# Patient Record
Sex: Female | Born: 1965 | Race: White | Hispanic: No | Marital: Married | Smoking: Never smoker
Health system: Southern US, Community
[De-identification: ages and names within clinical notes are randomized; demographics above are authoritative.]

## PROBLEM LIST (undated history)

## (undated) DIAGNOSIS — I341 Nonrheumatic mitral (valve) prolapse: Secondary | ICD-10-CM

## (undated) DIAGNOSIS — Z973 Presence of spectacles and contact lenses: Secondary | ICD-10-CM

## (undated) DIAGNOSIS — F419 Anxiety disorder, unspecified: Secondary | ICD-10-CM

## (undated) DIAGNOSIS — I10 Essential (primary) hypertension: Secondary | ICD-10-CM

## (undated) DIAGNOSIS — M533 Sacrococcygeal disorders, not elsewhere classified: Secondary | ICD-10-CM

## (undated) DIAGNOSIS — R112 Nausea with vomiting, unspecified: Secondary | ICD-10-CM

## (undated) DIAGNOSIS — K219 Gastro-esophageal reflux disease without esophagitis: Secondary | ICD-10-CM

## (undated) DIAGNOSIS — M1990 Unspecified osteoarthritis, unspecified site: Secondary | ICD-10-CM

## (undated) DIAGNOSIS — Z9889 Other specified postprocedural states: Secondary | ICD-10-CM

## (undated) DIAGNOSIS — C801 Malignant (primary) neoplasm, unspecified: Secondary | ICD-10-CM

## (undated) DIAGNOSIS — E039 Hypothyroidism, unspecified: Secondary | ICD-10-CM

## (undated) DIAGNOSIS — G971 Other reaction to spinal and lumbar puncture: Secondary | ICD-10-CM

## (undated) HISTORY — PX: COLONOSCOPY W/ BIOPSIES AND POLYPECTOMY: SHX1376

## (undated) HISTORY — PX: BACK SURGERY: SHX140

## (undated) HISTORY — PX: TUBAL LIGATION: SHX77

## (undated) HISTORY — PX: ENDOMETRIAL ABLATION: SHX621

---

## 2013-09-12 DIAGNOSIS — R350 Frequency of micturition: Secondary | ICD-10-CM | POA: Insufficient documentation

## 2013-09-12 DIAGNOSIS — F419 Anxiety disorder, unspecified: Secondary | ICD-10-CM | POA: Insufficient documentation

## 2013-09-12 DIAGNOSIS — E039 Hypothyroidism, unspecified: Secondary | ICD-10-CM | POA: Insufficient documentation

## 2013-10-21 DIAGNOSIS — C4491 Basal cell carcinoma of skin, unspecified: Secondary | ICD-10-CM | POA: Insufficient documentation

## 2013-10-21 DIAGNOSIS — Z85828 Personal history of other malignant neoplasm of skin: Secondary | ICD-10-CM | POA: Insufficient documentation

## 2013-11-16 ENCOUNTER — Ambulatory Visit: Payer: Self-pay | Admitting: Family Medicine

## 2013-11-16 DIAGNOSIS — I341 Nonrheumatic mitral (valve) prolapse: Secondary | ICD-10-CM | POA: Insufficient documentation

## 2013-12-18 ENCOUNTER — Ambulatory Visit: Payer: Self-pay | Admitting: Physician Assistant

## 2013-12-18 LAB — URINALYSIS, COMPLETE
BILIRUBIN, UR: NEGATIVE
Blood: NEGATIVE
Glucose,UR: NEGATIVE mg/dL (ref 0–75)
Ketone: NEGATIVE
Nitrite: NEGATIVE
PH: 6.5 (ref 4.5–8.0)
Protein: 30
RBC,UR: NONE SEEN /HPF (ref 0–5)
Specific Gravity: 1.01 (ref 1.003–1.030)
WBC UR: >30 /HPF (ref 0–5)

## 2013-12-20 LAB — URINE CULTURE

## 2014-11-20 DIAGNOSIS — Z85820 Personal history of malignant melanoma of skin: Secondary | ICD-10-CM | POA: Insufficient documentation

## 2014-12-06 DIAGNOSIS — R002 Palpitations: Secondary | ICD-10-CM | POA: Insufficient documentation

## 2014-12-06 DIAGNOSIS — E782 Mixed hyperlipidemia: Secondary | ICD-10-CM | POA: Insufficient documentation

## 2014-12-16 ENCOUNTER — Emergency Department: Payer: Self-pay | Admitting: Emergency Medicine

## 2015-03-02 ENCOUNTER — Ambulatory Visit: Payer: Self-pay | Admitting: Family Medicine

## 2015-09-18 DIAGNOSIS — I10 Essential (primary) hypertension: Secondary | ICD-10-CM | POA: Insufficient documentation

## 2016-03-31 ENCOUNTER — Ambulatory Visit: Payer: Self-pay | Admitting: Physician Assistant

## 2016-05-19 NOTE — Pre-Procedure Instructions (Signed)
Donna Cannon  05/19/2016      Your procedure is scheduled on June 14.  Report to [facility] Admitting at 6:30 A.M.  Call this number if you have problems the morning of surgery:  [phone]   Remember:  Do not eat food or drink liquids after midnight.  Take these medicines the morning of surgery with A SIP OF WATER Levothyroxine, Lorazepam (if needed, Metoprolol   STOP/ Do not take Aspirin, Aleve, Naproxen, Advil, Ibuprofen, Motrin, Vitamins, Herbs, or Supplements starting Wednesday June 7   Do not wear jewelry, make-up or nail polish.  Do not wear lotions, powders, or perfumes.  You may wear deodorant.  Do not shave 48 hours prior to surgery.  Men may shave face and neck.  Do not bring valuables to the hospital.  [facility] is not responsible for any belongings or valuables.  Contacts, dentures or bridgework may not be worn into surgery.  Leave your suitcase in the car.  After surgery it may be brought to your room.  For patients admitted to the hospital, discharge time will be determined by your treatment team.  Patients discharged the day of surgery will not be allowed to drive home.   [facility] - Preparing for Surgery  Before surgery, you can play an important role.  Because skin is not sterile, your skin needs to be as free of germs as possible.  You can reduce the number of germs on you skin by washing with CHG (chlorahexidine gluconate) soap before surgery.  CHG is an antiseptic cleaner which kills germs and bonds with the skin to continue killing germs even after washing.  Please DO NOT use if you have an allergy to CHG or antibacterial soaps.  If your skin becomes reddened/irritated stop using the CHG and inform your nurse when you arrive at Short Stay.  Do not shave (including legs and underarms) for at least 48 hours prior to the first CHG shower.  You may shave your face.  Please follow these instructions carefully:   1.  Shower with CHG Soap the  night before surgery and the morning of Surgery.  2.  If you choose to wash your hair, wash your hair first as usual with your normal shampoo.  3.  After you shampoo, rinse your hair and body thoroughly to remove the shampoo.  4.  Use CHG as you would any other liquid soap.  You can apply CHG directly to the skin and wash gently with scrungie or a clean washcloth.  5.  Apply the CHG Soap to your body ONLY FROM THE NECK DOWN.  Do not use on open wounds or open sores.  Avoid contact with your eyes, ears, mouth and genitals (private parts).  Wash genitals (private parts) with your normal soap.  6.  Wash thoroughly, paying special attention to the area where your surgery will be performed.  7.  Thoroughly rinse your body with warm water from the neck down.  8.  DO NOT shower/wash with your normal soap after using and rinsing off the CHG Soap.  9.  Pat yourself dry with a clean towel.            10.  Wear clean pajamas.            11.  Place clean sheets on your bed the night of your first shower and do not sleep with pets.  Day of Surgery  Do not apply any lotions the morning  of surgery.  Please wear clean clothes to the hospital/surgery center.

## 2016-05-20 ENCOUNTER — Encounter (HOSPITAL_COMMUNITY)
Admission: RE | Admit: 2016-05-20 | Discharge: 2016-05-20 | Disposition: A | Discharge status: Home / Self Care | Payer: BLUE CROSS/BLUE SHIELD | Source: Ambulatory Visit | Attending: Orthopedic Surgery | Admitting: Orthopedic Surgery

## 2016-05-20 ENCOUNTER — Encounter (HOSPITAL_COMMUNITY): Payer: Self-pay

## 2016-05-20 DIAGNOSIS — Z0183 Encounter for blood typing: Secondary | ICD-10-CM | POA: Diagnosis not present

## 2016-05-20 DIAGNOSIS — M533 Sacrococcygeal disorders, not elsewhere classified: Secondary | ICD-10-CM | POA: Insufficient documentation

## 2016-05-20 DIAGNOSIS — Z01812 Encounter for preprocedural laboratory examination: Secondary | ICD-10-CM | POA: Diagnosis present

## 2016-05-20 HISTORY — DX: Nonrheumatic mitral (valve) prolapse: I34.1

## 2016-05-20 HISTORY — DX: Hypothyroidism, unspecified: E03.9

## 2016-05-20 HISTORY — DX: Essential (primary) hypertension: I10

## 2016-05-20 HISTORY — DX: Other specified postprocedural states: Z98.890

## 2016-05-20 HISTORY — DX: Malignant (primary) neoplasm, unspecified: C80.1

## 2016-05-20 HISTORY — DX: Anxiety disorder, unspecified: F41.9

## 2016-05-20 HISTORY — DX: Other specified postprocedural states: R11.2

## 2016-05-20 HISTORY — DX: Other reaction to spinal and lumbar puncture: G97.1

## 2016-05-20 HISTORY — DX: Presence of spectacles and contact lenses: Z97.3

## 2016-05-20 HISTORY — DX: Gastro-esophageal reflux disease without esophagitis: K21.9

## 2016-05-20 HISTORY — DX: Unspecified osteoarthritis, unspecified site: M19.90

## 2016-05-20 HISTORY — DX: Sacrococcygeal disorders, not elsewhere classified: M53.3

## 2016-05-20 LAB — BASIC METABOLIC PANEL
Anion gap: 9 (ref 5–15)
BUN: 11 mg/dL (ref 6–20)
CO2: 19 mmol/L — ABNORMAL LOW (ref 22–32)
Calcium: 9.2 mg/dL (ref 8.9–10.3)
Chloride: 109 mmol/L (ref 101–111)
Creatinine, Ser: 1.12 mg/dL — ABNORMAL HIGH (ref 0.44–1.00)
GFR calc Af Amer: >60 mL/min (ref >60)
GFR, EST NON AFRICAN AMERICAN: 56 mL/min — AB (ref >60)
Glucose, Bld: 92 mg/dL (ref 65–99)
POTASSIUM: 3.9 mmol/L (ref 3.5–5.1)
SODIUM: 137 mmol/L (ref 135–145)

## 2016-05-20 LAB — TYPE AND SCREEN
ABO/RH(D): A POS
Antibody Screen: NEGATIVE

## 2016-05-20 LAB — CBC
HEMATOCRIT: 40 % (ref 36.0–46.0)
HEMOGLOBIN: 13 g/dL (ref 12.0–15.0)
MCH: 30.4 pg (ref 26.0–34.0)
MCHC: 32.5 g/dL (ref 30.0–36.0)
MCV: 93.7 fL (ref 78.0–100.0)
Platelets: 309 10*3/uL (ref 150–400)
RBC: 4.27 MIL/uL (ref 3.87–5.11)
RDW: 14.1 % (ref 11.5–15.5)
WBC: 7.2 10*3/uL (ref 4.0–10.5)

## 2016-05-20 LAB — HCG, SERUM, QUALITATIVE: Preg, Serum: NEGATIVE

## 2016-05-20 LAB — SURGICAL PCR SCREEN
MRSA, PCR: NEGATIVE
STAPHYLOCOCCUS AUREUS: NEGATIVE

## 2016-05-20 LAB — ABO/RH: ABO/RH(D): A POS

## 2016-05-20 NOTE — Progress Notes (Signed)
Pt denies SOB and chest pain but is under the care of Dr. Nehemiah Massed, Cardiology, at [facility]. Cardiac records requested and received. Surgery has been canceled for 05/28/16 but if pt rescheduled, pt history  needs to be reviewed by anesthesia.

## 2016-05-20 NOTE — Progress Notes (Signed)
Pt denies having a cardiac cath. Pt PCP is, Dr. Hoy Morn, at [facility] in [place], [place].

## 2016-05-28 ENCOUNTER — Ambulatory Visit (HOSPITAL_COMMUNITY)
Admission: RE | Admit: 2016-05-28 | Payer: BLUE CROSS/BLUE SHIELD | Source: Ambulatory Visit | Admitting: Orthopedic Surgery

## 2016-05-28 ENCOUNTER — Encounter (HOSPITAL_COMMUNITY): Admission: RE | Payer: Self-pay | Source: Ambulatory Visit

## 2016-05-28 SURGERY — SACROILIAC JOINT FUSION
Anesthesia: General | Site: Back | Laterality: Right

## 2016-11-13 DIAGNOSIS — K2100 Gastro-esophageal reflux disease with esophagitis, without bleeding: Secondary | ICD-10-CM | POA: Insufficient documentation

## 2016-11-17 ENCOUNTER — Ambulatory Visit (INDEPENDENT_AMBULATORY_CARE_PROVIDER_SITE_OTHER): Payer: BLUE CROSS/BLUE SHIELD | Admitting: Gastroenterology

## 2016-11-17 ENCOUNTER — Other Ambulatory Visit: Payer: Self-pay

## 2016-11-17 ENCOUNTER — Encounter: Payer: Self-pay | Admitting: Gastroenterology

## 2016-11-17 VITALS — BP 121/80 | HR 73 | Temp 98.7°F | Ht 66.0 in | Wt 193.0 lb

## 2016-11-17 DIAGNOSIS — K219 Gastro-esophageal reflux disease without esophagitis: Secondary | ICD-10-CM | POA: Diagnosis not present

## 2016-11-17 NOTE — Progress Notes (Signed)
Gastroenterology Consultation  Referring Provider:     No ref. provider found Primary Care Physician:  Hortencia Pilar, MD Primary Gastroenterologist:  Dr. Jonathon Bellows  Reason for Consultation:     GERD        HPI:   Donna Cannon is a 50 y.o. y/o female referred for consultation & management  by Dr. Hortencia Pilar, MD.    She has been referred by Dr Hoy Morn for GERD. Her last colonoscopy was back in 01/2015 where she was found to have internal hemorrhoids and a 5 mm  Polyp at the hepatic flexure. A 5 year repeat was suggested.   Reflux:  Onset : 10-15 years, past 3-4 years been on Zegrid.Last 3 months Zegrid has not helped.  Symptoms: heartburn, something sticking in her throat, feels like food getting stuck but does not believe food is actually stuck.  Drinking water too makes it worse.  Recent weight gain: no change  Medications: Zegrid, On for the past 4 weeks pantoprazole, has helped, she just ran  Out of the medications and can notice the symptoms when off the meds.She takes it at night after her dinner. Narcotics or anticholinergics use :  Not on opiods. Due to have her thyroid checked soon .  PPI /H2 blockers or Antacid  use and timing :as above Dinner time : 6 -6.30 , bed time around 9-10 pm In between laying in bed watching TV  Prior EGD: never  Family history of esophageal cancer:no     Past Medical History:  Diagnosis Date  . Anxiety   . Arthritis   . Cancer ([place])    melanoma  . GERD (gastroesophageal reflux disease)   . Hypertension   . Hypothyroidism   . Mitral valve prolapse    " mild"  . PONV (postoperative nausea and vomiting)   . Sacroiliac dysfunction    left  . Spinal headache   . Wears glasses     Past Surgical History:  Procedure Laterality Date  . BACK SURGERY     lumbar decompression  . CESAREAN SECTION    . COLONOSCOPY W/ BIOPSIES AND POLYPECTOMY    . ENDOMETRIAL ABLATION    . TUBAL LIGATION      Prior to Admission medications     Medication Sig Start Date End Date Taking? Authorizing Provider  acetaminophen (TYLENOL) 650 MG CR tablet Take 650 mg by mouth daily as needed for pain.   Yes Historical Provider, MD  furosemide (LASIX) 20 MG tablet Take 20 mg by mouth daily.  03/12/16  Yes Historical Provider, MD  levothyroxine (SYNTHROID, LEVOTHROID) 88 MCG tablet Take 88 mcg by mouth daily before breakfast.   Yes Historical Provider, MD  LORazepam (ATIVAN) 0.5 MG tablet Take 0.5 mg by mouth daily as needed for anxiety.  02/23/16  Yes Historical Provider, MD  losartan (COZAAR) 50 MG tablet Take 50 mg by mouth at bedtime.  03/12/16  Yes Historical Provider, MD  metoprolol succinate (TOPROL-XL) 50 MG 24 hr tablet Take 50 mg by mouth at bedtime.  03/12/16  Yes Historical Provider, MD  pantoprazole (PROTONIX) 40 MG tablet Take by mouth. 10/10/16 10/10/17 Yes Historical Provider, MD    Family History  Problem Relation Age of Onset  . Breast cancer Mother   . Hypertension Mother   . Hypertension Sister   . Colon cancer Brother      Social History  Substance Use Topics  . Smoking status: Never Smoker  . Smokeless tobacco: Never Used  .  Alcohol use Yes     Comment: social wine    Allergies as of 11/17/2016 - Review Complete 11/17/2016  Allergen Reaction Noted  . Hydrocodone Nausea Only 05/16/2016    Review of Systems:    All systems reviewed and negative except where noted in HPI.   Physical Exam:  BP 121/80   Pulse 73   Temp 98.7 F (37.1 C) (Oral)   Ht 5\' 6"  (1.676 m)   Wt 193 lb (87.5 kg)   BMI 31.15 kg/m  No LMP recorded. Psych:  Alert and cooperative. Normal mood and affect. General:   Alert,  Well-developed, well-nourished, pleasant and cooperative in NAD Head:  Normocephalic and atraumatic. Eyes:  Sclera clear, no icterus.   Conjunctiva pink. Ears:  Normal auditory acuity. Nose:  No deformity, discharge, or lesions. Mouth:  No deformity or lesions,oropharynx pink & moist. Neck:  Supple; no masses or  thyromegaly. Lungs:  Respirations even and unlabored.  Clear throughout to auscultation.   No wheezes, crackles, or rhonchi. No acute distress. Heart:  Regular rate and rhythm; no murmurs, clicks, rubs, or gallops. Abdomen:  Normal bowel sounds.  No bruits.  Soft, non-tender and non-distended without masses, hepatosplenomegaly or hernias noted.  No guarding or rebound tenderness.    Psych:  Alert and cooperative. Normal mood and affect.  Imaging Studies: No results found.  Assessment and Plan:   Donna Cannon is a 50 y.o. y/o female has been referred for GERD.I counseled on life style changes, suggest to use PPI first thing in the morning ( presently using it at night after dinner) on empty stomach and eat 30 minutes after. Advised on the use of a wedge pillow at night , avoid meals for 2 hours prior to bed time. Weight loss .Discussed the risks and benefits of long term PPI use including but not limited to bone loss, chronic kidney disease, infections , low magnesium . Aim to use at the lowest dose for the shortest period of time   Discussed the rationale for an endoscopy to screen for [facility]  and decided to proceed .I have discussed alternative options, risks & benefits,  which include, but are not limited to, bleeding, infection, perforation,respiratory complication & drug reaction.  The patient agrees with this plan & written consent will be obtained.   .   Follow up in 6 weeks     Dr Jonathon Bellows MD

## 2016-11-17 NOTE — Patient Instructions (Signed)
Gastroesophageal Reflux Disease, Adult Introduction Normally, food travels down the esophagus and stays in the stomach to be digested. If a person has gastroesophageal reflux disease (GERD), food and stomach acid move back up into the esophagus. When this happens, the esophagus becomes sore and swollen (inflamed). Over time, GERD can make small holes (ulcers) in the lining of the esophagus. Follow these instructions at home: Diet  Follow a diet as told by your doctor. You may need to avoid foods and drinks such as:  Coffee and tea (with or without caffeine).  Drinks that contain alcohol.  Energy drinks and sports drinks.  Carbonated drinks or sodas.  Chocolate and cocoa.  Peppermint and mint flavorings.  Garlic and onions.  Horseradish.  Spicy and acidic foods, such as peppers, chili powder, curry powder, vinegar, hot sauces, and BBQ sauce.  Citrus fruit juices and citrus fruits, such as oranges, lemons, and limes.  Tomato-based foods, such as red sauce, chili, salsa, and pizza with red sauce.  Fried and fatty foods, such as donuts, french fries, potato chips, and high-fat dressings.  High-fat meats, such as hot dogs, rib eye steak, sausage, ham, and bacon.  High-fat dairy items, such as whole milk, butter, and cream cheese.  Eat small meals often. Avoid eating large meals.  Avoid drinking large amounts of liquid with your meals.  Avoid eating meals during the 2-3 hours before bedtime.  Avoid lying down right after you eat.  Do not exercise right after you eat. General instructions  Pay attention to any changes in your symptoms.  Take over-the-counter and prescription medicines only as told by your doctor. Do not take aspirin, ibuprofen, or other NSAIDs unless your doctor says it is okay.  Do not use any tobacco products, including cigarettes, chewing tobacco, and e-cigarettes. If you need help quitting, ask your doctor.  Wear loose clothes. Do not wear anything  tight around your waist.  Raise (elevate) the head of your bed about 6 inches (15 cm).  Try to lower your stress. If you need help doing this, ask your doctor.  If you are overweight, lose an amount of weight that is healthy for you. Ask your doctor about a safe weight loss goal.  Keep all follow-up visits as told by your doctor. This is important. Contact a doctor if:  You have new symptoms.  You lose weight and you do not know why it is happening.  You have trouble swallowing, or it hurts to swallow.  You have wheezing or a cough that keeps happening.  Your symptoms do not get better with treatment.  You have a hoarse voice. Get help right away if:  You have pain in your arms, neck, jaw, teeth, or back.  You feel sweaty, dizzy, or light-headed.  You have chest pain or shortness of breath.  You throw up (vomit) and your throw up looks like blood or coffee grounds.  You pass out (faint).  Your poop (stool) is bloody or black.  You cannot swallow, drink, or eat. This information is not intended to replace advice given to you by your health care provider. Make sure you discuss any questions you have with your health care provider. Document Released: 05/19/2008 Document Revised: 05/08/2016 Document Reviewed: 03/28/2015  2017 Elsevier  

## 2016-11-28 ENCOUNTER — Ambulatory Visit
Admission: RE | Admit: 2016-11-28 | Discharge: 2016-11-28 | Disposition: A | Discharge status: Home / Self Care | Payer: BLUE CROSS/BLUE SHIELD | Source: Ambulatory Visit | Attending: Gastroenterology | Admitting: Gastroenterology

## 2016-11-28 ENCOUNTER — Encounter
Admission: RE | Disposition: A | Discharge status: Home / Self Care | Payer: Self-pay | Source: Ambulatory Visit | Attending: Gastroenterology

## 2016-11-28 ENCOUNTER — Encounter: Payer: Self-pay | Admitting: *Deleted

## 2016-11-28 DIAGNOSIS — K219 Gastro-esophageal reflux disease without esophagitis: Secondary | ICD-10-CM | POA: Insufficient documentation

## 2016-11-28 DIAGNOSIS — I341 Nonrheumatic mitral (valve) prolapse: Secondary | ICD-10-CM | POA: Insufficient documentation

## 2016-11-28 DIAGNOSIS — F419 Anxiety disorder, unspecified: Secondary | ICD-10-CM | POA: Diagnosis not present

## 2016-11-28 DIAGNOSIS — Z85820 Personal history of malignant melanoma of skin: Secondary | ICD-10-CM | POA: Insufficient documentation

## 2016-11-28 DIAGNOSIS — E039 Hypothyroidism, unspecified: Secondary | ICD-10-CM | POA: Diagnosis not present

## 2016-11-28 DIAGNOSIS — Z79899 Other long term (current) drug therapy: Secondary | ICD-10-CM | POA: Insufficient documentation

## 2016-11-28 DIAGNOSIS — Z538 Procedure and treatment not carried out for other reasons: Secondary | ICD-10-CM | POA: Diagnosis not present

## 2016-11-28 DIAGNOSIS — I10 Essential (primary) hypertension: Secondary | ICD-10-CM | POA: Insufficient documentation

## 2016-11-28 LAB — POCT PREGNANCY, URINE: Preg Test, Ur: NEGATIVE

## 2016-11-28 SURGERY — ESOPHAGOGASTRODUODENOSCOPY (EGD) WITH PROPOFOL
Anesthesia: General

## 2016-11-28 MED ORDER — SODIUM CHLORIDE 0.9 % IV SOLN: INTRAVENOUS | Status: DC

## 2016-11-28 NOTE — H&P (Signed)
Jonathon Bellows MD [address] [place], [place] [address] Phone: [phone] Fax : [phone]  Primary Care Physician:  Hortencia Pilar, MD Primary Gastroenterologist:  Dr. Jonathon Bellows   Pre-Procedure History & Physical: HPI:  Donna Cannon is a 50 y.o. female is here for an endoscopy.   Past Medical History:  Diagnosis Date  . Anxiety   . Arthritis   . Cancer ([place])    melanoma  . GERD (gastroesophageal reflux disease)   . Hypertension   . Hypothyroidism   . Mitral valve prolapse    " mild"  . PONV (postoperative nausea and vomiting)   . Sacroiliac dysfunction    left  . Spinal headache   . Wears glasses     Past Surgical History:  Procedure Laterality Date  . BACK SURGERY     lumbar decompression  . CESAREAN SECTION    . COLONOSCOPY W/ BIOPSIES AND POLYPECTOMY    . ENDOMETRIAL ABLATION    . TUBAL LIGATION      Prior to Admission medications   Medication Sig Start Date End Date Taking? Authorizing Provider  furosemide (LASIX) 20 MG tablet Take 20 mg by mouth daily.  03/12/16  Yes Historical Provider, MD  losartan (COZAAR) 50 MG tablet Take 50 mg by mouth at bedtime.  03/12/16  Yes Historical Provider, MD  metoprolol succinate (TOPROL-XL) 50 MG 24 hr tablet Take 50 mg by mouth at bedtime.  03/12/16  Yes Historical Provider, MD  acetaminophen (TYLENOL) 650 MG CR tablet Take 650 mg by mouth daily as needed for pain.    Historical Provider, MD  levothyroxine (SYNTHROID, LEVOTHROID) 88 MCG tablet Take 88 mcg by mouth daily before breakfast.    Historical Provider, MD  LORazepam (ATIVAN) 0.5 MG tablet Take 0.5 mg by mouth daily as needed for anxiety.  02/23/16   Historical Provider, MD  pantoprazole (PROTONIX) 40 MG tablet Take by mouth. 10/10/16 10/10/17  Historical Provider, MD    Allergies as of 11/17/2016 - Review Complete 11/17/2016  Allergen Reaction Noted  . Hydrocodone Nausea Only 05/16/2016    Family History  Problem Relation Age of Onset  . Breast  cancer Mother   . Hypertension Mother   . Hypertension Sister   . Colon cancer Brother     Social History   Social History  . Marital status: Married    Spouse name: N/A  . Number of children: N/A  . Years of education: N/A   Occupational History  . Not on file.   Social History Main Topics  . Smoking status: Never Smoker  . Smokeless tobacco: Never Used  . Alcohol use Yes     Comment: social wine  . Drug use: No  . Sexual activity: Not on file   Other Topics Concern  . Not on file   Social History Narrative  . No narrative on file    Review of Systems: See HPI, otherwise negative ROS  Physical Exam: BP 140/88   Pulse 89   Temp 98.3 F (36.8 C) (Tympanic)   Resp 20   Ht 5\' 6"  (1.676 m)   Wt 193 lb (87.5 kg)   SpO2 99%   BMI 31.15 kg/m  General:   Alert,  pleasant and cooperative in NAD Head:  Normocephalic and atraumatic. Neck:  Supple; no masses or thyromegaly. Lungs:  Clear throughout to auscultation.    Heart:  Regular rate and rhythm. Abdomen:  Soft, nontender and nondistended. Normal bowel sounds, without guarding, and without  rebound.   Neurologic:  Alert and  oriented x4;  grossly normal neurologically.  Impression/Plan: Donna Cannon is here for an endoscopy to be performed for long standing GERD  Risks, benefits, limitations, and alternatives regarding  endoscopy have been reviewed with the patient.  Questions have been answered.  All parties agreeable.   Jonathon Bellows, MD  11/28/2016, 9:18 AM

## 2016-12-11 ENCOUNTER — Other Ambulatory Visit: Payer: Self-pay

## 2016-12-11 ENCOUNTER — Encounter: Payer: Self-pay | Admitting: *Deleted

## 2016-12-12 ENCOUNTER — Encounter
Admission: RE | Disposition: A | Discharge status: Home / Self Care | Payer: Self-pay | Source: Ambulatory Visit | Attending: Gastroenterology

## 2016-12-12 ENCOUNTER — Ambulatory Visit: Payer: BLUE CROSS/BLUE SHIELD | Admitting: Certified Registered Nurse Anesthetist

## 2016-12-12 ENCOUNTER — Encounter: Payer: Self-pay | Admitting: Anesthesiology

## 2016-12-12 ENCOUNTER — Ambulatory Visit
Admission: RE | Admit: 2016-12-12 | Discharge: 2016-12-12 | Disposition: A | Discharge status: Home / Self Care | Payer: BLUE CROSS/BLUE SHIELD | Source: Ambulatory Visit | Attending: Gastroenterology | Admitting: Gastroenterology

## 2016-12-12 DIAGNOSIS — Z79899 Other long term (current) drug therapy: Secondary | ICD-10-CM | POA: Insufficient documentation

## 2016-12-12 DIAGNOSIS — Z13810 Encounter for screening for upper gastrointestinal disorder: Secondary | ICD-10-CM

## 2016-12-12 DIAGNOSIS — K219 Gastro-esophageal reflux disease without esophagitis: Secondary | ICD-10-CM | POA: Insufficient documentation

## 2016-12-12 DIAGNOSIS — F419 Anxiety disorder, unspecified: Secondary | ICD-10-CM | POA: Insufficient documentation

## 2016-12-12 DIAGNOSIS — E039 Hypothyroidism, unspecified: Secondary | ICD-10-CM | POA: Insufficient documentation

## 2016-12-12 DIAGNOSIS — Z85820 Personal history of malignant melanoma of skin: Secondary | ICD-10-CM | POA: Diagnosis not present

## 2016-12-12 DIAGNOSIS — I341 Nonrheumatic mitral (valve) prolapse: Secondary | ICD-10-CM | POA: Insufficient documentation

## 2016-12-12 DIAGNOSIS — K317 Polyp of stomach and duodenum: Secondary | ICD-10-CM | POA: Diagnosis present

## 2016-12-12 DIAGNOSIS — I10 Essential (primary) hypertension: Secondary | ICD-10-CM | POA: Insufficient documentation

## 2016-12-12 DIAGNOSIS — M1990 Unspecified osteoarthritis, unspecified site: Secondary | ICD-10-CM | POA: Insufficient documentation

## 2016-12-12 HISTORY — PX: ESOPHAGOGASTRODUODENOSCOPY (EGD) WITH PROPOFOL: SHX5813

## 2016-12-12 SURGERY — ESOPHAGOGASTRODUODENOSCOPY (EGD) WITH PROPOFOL
Anesthesia: General

## 2016-12-12 MED ORDER — PROPOFOL 500 MG/50ML IV EMUL
INTRAVENOUS | Status: AC
  Filled 2016-12-12: qty 50

## 2016-12-12 MED ORDER — MIDAZOLAM HCL 5 MG/5ML IJ SOLN
INTRAMUSCULAR | Status: DC | PRN
  Administered 2016-12-12: 1 mg via INTRAVENOUS

## 2016-12-12 MED ORDER — MIDAZOLAM HCL 2 MG/2ML IJ SOLN
INTRAMUSCULAR | Status: AC
  Filled 2016-12-12: qty 2

## 2016-12-12 MED ORDER — SODIUM CHLORIDE 0.9 % IV SOLN
INTRAVENOUS | Status: DC
  Administered 2016-12-12: 09:00:00 via INTRAVENOUS

## 2016-12-12 MED ORDER — GLYCOPYRROLATE 0.2 MG/ML IJ SOLN
INTRAMUSCULAR | Status: DC | PRN
  Administered 2016-12-12: 0.2 mg via INTRAVENOUS

## 2016-12-12 MED ORDER — FENTANYL CITRATE (PF) 100 MCG/2ML IJ SOLN
INTRAMUSCULAR | Status: DC | PRN
  Administered 2016-12-12: 50 ug via INTRAVENOUS

## 2016-12-12 MED ORDER — LIDOCAINE 2% (20 MG/ML) 5 ML SYRINGE
INTRAMUSCULAR | Status: DC | PRN
  Administered 2016-12-12: 50 mg via INTRAVENOUS

## 2016-12-12 MED ORDER — FENTANYL CITRATE (PF) 100 MCG/2ML IJ SOLN
INTRAMUSCULAR | Status: AC
  Filled 2016-12-12: qty 2

## 2016-12-12 MED ORDER — GLYCOPYRROLATE 0.2 MG/ML IJ SOLN
INTRAMUSCULAR | Status: AC
  Filled 2016-12-12: qty 1

## 2016-12-12 MED ORDER — PROPOFOL 500 MG/50ML IV EMUL
INTRAVENOUS | Status: DC | PRN
  Administered 2016-12-12: 120 ug/kg/min via INTRAVENOUS

## 2016-12-12 MED ORDER — LIDOCAINE 2% (20 MG/ML) 5 ML SYRINGE
INTRAMUSCULAR | Status: AC
  Filled 2016-12-12: qty 5

## 2016-12-12 MED ORDER — PROPOFOL 10 MG/ML IV BOLUS
INTRAVENOUS | Status: DC | PRN
  Administered 2016-12-12: 70 mg via INTRAVENOUS
  Administered 2016-12-12: 30 mg via INTRAVENOUS

## 2016-12-12 NOTE — H&P (Signed)
Jonathon Bellows MD [address], [place], [place] [address] Phone: [phone] Fax : [phone]  Primary Care Physician:  Hortencia Pilar, MD Primary Gastroenterologist:  Dr. Jonathon Bellows   Pre-Procedure History & Physical: HPI:  Donna Cannon is a 50 y.o. female is here for an endoscopy.   Past Medical History:  Diagnosis Date  . Anxiety   . Arthritis   . Cancer ([place])    melanoma  . GERD (gastroesophageal reflux disease)   . Hypertension   . Hypothyroidism   . Mitral valve prolapse    " mild"  . PONV (postoperative nausea and vomiting)   . Sacroiliac dysfunction    left  . Spinal headache   . Wears glasses     Past Surgical History:  Procedure Laterality Date  . BACK SURGERY     lumbar decompression  . CESAREAN SECTION    . COLONOSCOPY W/ BIOPSIES AND POLYPECTOMY    . ENDOMETRIAL ABLATION    . TUBAL LIGATION      Prior to Admission medications   Medication Sig Start Date End Date Taking? Authorizing Provider  furosemide (LASIX) 20 MG tablet Take 20 mg by mouth daily.  03/12/16  Yes Historical Provider, MD  levothyroxine (SYNTHROID, LEVOTHROID) 88 MCG tablet Take 88 mcg by mouth daily before breakfast.   Yes Historical Provider, MD  losartan (COZAAR) 50 MG tablet Take 50 mg by mouth at bedtime.  03/12/16  Yes Historical Provider, MD  metoprolol succinate (TOPROL-XL) 50 MG 24 hr tablet Take 50 mg by mouth at bedtime.  03/12/16  Yes Historical Provider, MD  pantoprazole (PROTONIX) 40 MG tablet Take by mouth. 10/10/16 10/10/17 Yes Historical Provider, MD  acetaminophen (TYLENOL) 650 MG CR tablet Take 650 mg by mouth daily as needed for pain.    Historical Provider, MD  LORazepam (ATIVAN) 0.5 MG tablet Take 0.5 mg by mouth daily as needed for anxiety.  02/23/16   Historical Provider, MD    Allergies as of 12/11/2016 - Review Complete 12/11/2016  Allergen Reaction Noted  . Hydrocodone Nausea Only 05/16/2016    Family History  Problem Relation Age of Onset  .  Breast cancer Mother   . Hypertension Mother   . Hypertension Sister   . Colon cancer Brother     Social History   Social History  . Marital status: Married    Spouse name: N/A  . Number of children: N/A  . Years of education: N/A   Occupational History  . Not on file.   Social History Main Topics  . Smoking status: Never Smoker  . Smokeless tobacco: Never Used  . Alcohol use Yes     Comment: social wine  . Drug use: No  . Sexual activity: Not on file   Other Topics Concern  . Not on file   Social History Narrative  . No narrative on file    Review of Systems: See HPI, otherwise negative ROS  Physical Exam: BP 108/73   Pulse 74   Temp 97.3 F (36.3 C) (Oral)   Resp 16   Ht 5\' 6"  (1.676 m)   Wt 190 lb (86.2 kg)   LMP 12/11/2016   SpO2 100%   BMI 30.67 kg/m  General:   Alert,  pleasant and cooperative in NAD Head:  Normocephalic and atraumatic. Neck:  Supple; no masses or thyromegaly. Lungs:  Clear throughout to auscultation.    Heart:  Regular rate and rhythm. Abdomen:  Soft, nontender and nondistended. Normal bowel sounds,  without guarding, and without rebound.   Neurologic:  Alert and  oriented x4;  grossly normal neurologically.  Impression/Plan: Donna Cannon is here for an endoscopy to be performed for barrettes screening  Risks, benefits, limitations, and alternatives regarding  endoscopy have been reviewed with the patient.  Questions have been answered.  All parties agreeable.   Jonathon Bellows, MD  12/12/2016, 8:35 AM

## 2016-12-12 NOTE — Anesthesia Postprocedure Evaluation (Signed)
Anesthesia Post Note  Patient: Donna Cannon  Procedure(s) Performed: Procedure(s) (LRB): ESOPHAGOGASTRODUODENOSCOPY (EGD) WITH PROPOFOL (N/A)  Patient location during evaluation: Other Anesthesia Type: General Level of consciousness: awake and alert Pain management: pain level controlled Vital Signs Assessment: post-procedure vital signs reviewed and stable Respiratory status: spontaneous breathing, nonlabored ventilation, respiratory function stable and patient connected to nasal cannula oxygen Cardiovascular status: blood pressure returned to baseline and stable Postop Assessment: no signs of nausea or vomiting Anesthetic complications: no     Last Vitals:  Vitals:   12/12/16 1006 12/12/16 1010  BP:  105/76  Pulse: 75   Resp: 18   Temp:      Last Pain:  Vitals:   12/12/16 0802  TempSrc: Oral                 Shyane Fossum S

## 2016-12-12 NOTE — Transfer of Care (Signed)
Immediate Anesthesia Transfer of Care Note  Patient: Donna Cannon  Procedure(s) Performed: Procedure(s): ESOPHAGOGASTRODUODENOSCOPY (EGD) WITH PROPOFOL (N/A)  Patient Location: Endoscopy Unit  Anesthesia Type:General  Level of Consciousness: awake and alert   Airway & Oxygen Therapy: Patient Spontanous Breathing and Patient connected to nasal cannula oxygen  Post-op Assessment: Report given to RN and Post -op Vital signs reviewed and stable  Post vital signs: Reviewed  Last Vitals:  Vitals:   12/12/16 0802 12/12/16 0940  BP: 108/73 103/69  Pulse: 74 78  Resp: 16 17  Temp: 36.3 C 36.2 C    Last Pain:  Vitals:   12/12/16 0802  TempSrc: Oral         Complications: No apparent anesthesia complications

## 2016-12-12 NOTE — Anesthesia Preprocedure Evaluation (Signed)
Anesthesia Evaluation  Patient identified by MRN, date of birth, ID band Patient awake    Reviewed: Allergy & Precautions, NPO status , Patient's Chart, lab work & pertinent test results, reviewed documented beta blocker date and time   History of Anesthesia Complications (+) PONV, POST - OP SPINAL HEADACHE and history of anesthetic complications  Airway Mallampati: II  TM Distance: >3 FB     Dental  (+) Chipped   Pulmonary           Cardiovascular hypertension, Pt. on medications      Neuro/Psych  Headaches, Anxiety    GI/Hepatic GERD  ,  Endo/Other  Hypothyroidism   Renal/GU      Musculoskeletal  (+) Arthritis ,   Abdominal   Peds  Hematology   Anesthesia Other Findings   Reproductive/Obstetrics                             Anesthesia Physical Anesthesia Plan  ASA: III  Anesthesia Plan: General   Post-op Pain Management:    Induction: Intravenous  Airway Management Planned: Nasal Cannula  Additional Equipment:   Intra-op Plan:   Post-operative Plan:   Informed Consent: I have reviewed the patients History and Physical, chart, labs and discussed the procedure including the risks, benefits and alternatives for the proposed anesthesia with the patient or authorized representative who has indicated his/her understanding and acceptance.     Plan Discussed with: CRNA  Anesthesia Plan Comments:         Anesthesia Quick Evaluation

## 2016-12-12 NOTE — Op Note (Signed)
[facility] Gastroenterology Patient Name: Donna Cannon Procedure Date: 12/12/2016 9:28 AM MRN: [id number] Account #: [id number] Date of Birth: [birth year] Admit Type: Outpatient Age: 50 Room: [facility] ENDO ROOM 4 Gender: Female Note Status: Finalized Procedure:            Upper GI endoscopy Indications:          Screening for Barrett's esophagus Providers:            Jonathon Bellows MD, MD Referring MD:         Kerin Perna, MD (Referring MD) Medicines:            Monitored Anesthesia Care Complications:        No immediate complications. Procedure:            Pre-Anesthesia Assessment:                       - Prior to the procedure, a History and Physical was                        performed, and patient medications, allergies and                        sensitivities were reviewed. The patient's tolerance of                        previous anesthesia was reviewed.                       - The risks and benefits of the procedure and the                        sedation options and risks were discussed with the                        patient. All questions were answered and informed                        consent was obtained.                       - The risks and benefits of the procedure and the                        sedation options and risks were discussed with the                        patient. All questions were answered and informed                        consent was obtained.                       - ASA Grade Assessment: III - A patient with severe                        systemic disease.                       After obtaining informed consent, the endoscope was  passed under direct vision. Throughout the procedure,                        the patient's blood pressure, pulse, and oxygen                        saturations were monitored continuously. The Endoscope                        was introduced through the mouth, and advanced to the                         third part of duodenum. The upper GI endoscopy was                        accomplished with ease. The patient tolerated the                        procedure well. Findings:      The examined duodenum was normal.      The esophagus was normal.      A few 6 mm sessile polyps with no bleeding and no stigmata of recent       bleeding were found in the gastric fundus. Biopsies were taken with a       cold forceps for histology. Impression:           - Normal examined duodenum.                       - Normal esophagus.                       - A few gastric polyps. Biopsied. Recommendation:       - Patient has a contact number available for                        emergencies. The signs and symptoms of potential                        delayed complications were discussed with the patient.                        Return to normal activities tomorrow. Written discharge                        instructions were provided to the patient.                       - Resume previous diet.                       - Continue present medications.                       - Await pathology results.                       - No repeat upper endoscopy.                       - Return to GI office as previously scheduled. Procedure Code(s):    --- Professional ---  [id number], Esophagogastroduodenoscopy, flexible, transoral;                        with biopsy, single or multiple Diagnosis Code(s):    --- Professional ---                       K31.7, Polyp of stomach and duodenum                       Z13.810, Encounter for screening for upper                        gastrointestinal disorder CPT copyright 2016 [employer]. All rights reserved. The codes documented in this report are preliminary and upon coder review may  be revised to meet current compliance requirements. Jonathon Bellows, MD Jonathon Bellows MD, MD 12/12/2016 9:39:05 AM This report has been signed  electronically. Number of Addenda: 0 Note Initiated On: 12/12/2016 9:28 AM      [facility]

## 2016-12-16 ENCOUNTER — Encounter: Payer: Self-pay | Admitting: Gastroenterology

## 2016-12-16 LAB — SURGICAL PATHOLOGY

## 2016-12-18 ENCOUNTER — Encounter: Payer: Self-pay | Admitting: Gastroenterology

## 2017-02-16 ENCOUNTER — Ambulatory Visit: Payer: BLUE CROSS/BLUE SHIELD | Admitting: Gastroenterology

## 2017-03-16 ENCOUNTER — Encounter: Payer: Self-pay | Admitting: Gastroenterology

## 2017-03-16 ENCOUNTER — Ambulatory Visit (INDEPENDENT_AMBULATORY_CARE_PROVIDER_SITE_OTHER): Payer: BLUE CROSS/BLUE SHIELD | Admitting: Gastroenterology

## 2017-03-16 VITALS — BP 118/76 | HR 78 | Temp 98.3°F | Ht 66.0 in | Wt 191.8 lb

## 2017-03-16 DIAGNOSIS — M5430 Sciatica, unspecified side: Secondary | ICD-10-CM | POA: Insufficient documentation

## 2017-03-16 DIAGNOSIS — G8929 Other chronic pain: Secondary | ICD-10-CM | POA: Insufficient documentation

## 2017-03-16 DIAGNOSIS — K219 Gastro-esophageal reflux disease without esophagitis: Secondary | ICD-10-CM | POA: Diagnosis not present

## 2017-03-16 DIAGNOSIS — M545 Low back pain, unspecified: Secondary | ICD-10-CM | POA: Insufficient documentation

## 2017-03-16 NOTE — Progress Notes (Signed)
Primary Care Physician: Hortencia Pilar, MD  Primary Gastroenterologist:  Dr. Jonathon Bellows     HPI: Donna Cannon is a 51 y.o. female for GERD. She has had GERD for 10-15 years, been on pantoprazole recently   Interval history 11/2016-03/2017  EGD 11/2016 was normal except for a few fundic gland polyps.   Presently on Protonix 40 mg - before meals, first thing in the morning ,weight stable.   She says that she has had a few days developing breakthrough symptoms of hearburn. Last week was 3 days.  She says she consumes red meat 3-4 times a week and at dinner < 2 hours before bed time.   BP 118/76 (BP Location: Left Arm, Patient Position: Sitting, Cuff Size: Large)   Pulse 78   Temp 98.3 F (36.8 C) (Oral)   Ht 5\' 6"  (1.676 m)   Wt 191 lb 12.8 oz (87 kg)   BMI 30.96 kg/m      Current Outpatient Prescriptions  Medication Sig Dispense Refill  . acetaminophen (TYLENOL) 650 MG CR tablet Take 650 mg by mouth daily as needed for pain.    . furosemide (LASIX) 20 MG tablet Take 20 mg by mouth daily.   3  . levothyroxine (SYNTHROID, LEVOTHROID) 88 MCG tablet Take 88 mcg by mouth daily before breakfast.    . LORazepam (ATIVAN) 0.5 MG tablet Take 0.5 mg by mouth daily as needed for anxiety.   1  . losartan (COZAAR) 50 MG tablet Take 50 mg by mouth at bedtime.   3  . metoprolol succinate (TOPROL-XL) 50 MG 24 hr tablet Take 50 mg by mouth at bedtime.   3  . pantoprazole (PROTONIX) 40 MG tablet Take by mouth.     No current facility-administered medications for this visit.     Allergies as of 03/16/2017 - Review Complete 12/12/2016  Allergen Reaction Noted  . Hydrocodone Nausea Only 05/16/2016    ROS:  General: Negative for anorexia, weight loss, fever, chills, fatigue, weakness. ENT: Negative for hoarseness, difficulty swallowing , nasal congestion. CV: Negative for chest pain, angina, palpitations, dyspnea on exertion, peripheral edema.  Respiratory: Negative for dyspnea at rest,  dyspnea on exertion, cough, sputum, wheezing.  GI: See history of present illness. GU:  Negative for dysuria, hematuria, urinary incontinence, urinary frequency, nocturnal urination.  Endo: Negative for unusual weight change.    Physical Examination:   There were no vitals taken for this visit.  General: Well-nourished, well-developed in no acute distress.  Eyes: No icterus. Conjunctivae pink. Mouth: Oropharyngeal mucosa moist and pink , no lesions erythema or exudate. Lungs: Clear to auscultation bilaterally. Non-labored. Heart: Regular rate and rhythm, no murmurs rubs or gallops.  Abdomen: Bowel sounds are normal, nontender, nondistended, no hepatosplenomegaly or masses, no abdominal bruits or hernia , no rebound or guarding.   Extremities: No lower extremity edema. No clubbing or deformities. Neuro: Alert and oriented x 3.  Grossly intact. Skin: Warm and dry, no jaundice.   Psych: Alert and cooperative, normal mood and affect.   Imaging Studies: No results found.  Assessment and Plan:   Donna Cannon is a 51 y.o. y/o female here to follow up  for GERD.She is doing reasonably well on her PPI but has breakthrough symptoms which I think is more likely secondary to the high fat content in the red meat she consumes 3-4 times a week which stays in her stomach for longer periods of time and leads to more acid production. Discussed options  of increasing medications or decreasing fat in her diet. She is willing to cut down the red meat and fat in her diet rather than increase her medications which is definitely the best choice. At the next visit if doing well will start to plan to decrease the dose of her PPI    Dr Jonathon Bellows  MD Follow up in 3 months

## 2017-07-20 ENCOUNTER — Ambulatory Visit: Payer: BLUE CROSS/BLUE SHIELD | Admitting: Gastroenterology

## 2018-07-13 ENCOUNTER — Ambulatory Visit
Admission: EM | Admit: 2018-07-13 | Discharge: 2018-07-13 | Disposition: A | Discharge status: Home / Self Care | Payer: BLUE CROSS/BLUE SHIELD | Attending: Family Medicine | Admitting: Family Medicine

## 2018-07-13 ENCOUNTER — Encounter: Payer: Self-pay | Admitting: Emergency Medicine

## 2018-07-13 ENCOUNTER — Other Ambulatory Visit: Payer: Self-pay

## 2018-07-13 DIAGNOSIS — R05 Cough: Secondary | ICD-10-CM

## 2018-07-13 DIAGNOSIS — J40 Bronchitis, not specified as acute or chronic: Secondary | ICD-10-CM | POA: Diagnosis not present

## 2018-07-13 DIAGNOSIS — R059 Cough, unspecified: Secondary | ICD-10-CM

## 2018-07-13 MED ORDER — AZITHROMYCIN 250 MG PO TABS: ORAL_TABLET | ORAL | 0 refills | Status: DC

## 2018-07-13 MED ORDER — BENZONATATE 200 MG PO CAPS
200.0000 mg | ORAL_CAPSULE | Freq: Three times a day (TID) | ORAL | 0 refills | Status: DC | PRN
Start: 2018-07-13 — End: 2022-03-17

## 2018-07-13 NOTE — ED Provider Notes (Signed)
[facility]    CSN: [id number] Arrival date & time: 07/13/18  1251     History   Chief Complaint Chief Complaint  Patient presents with  . Cough  . Shortness of Breath    HPI Donna Cannon is a 52 y.o. female.    Cough  Associated symptoms: shortness of breath   Associated symptoms: no wheezing   Shortness of Breath  Associated symptoms: cough   Associated symptoms: no neck pain and no wheezing   URI  Presenting symptoms: congestion and cough   Severity:  Moderate Onset quality:  Sudden Duration:  5 days Timing:  Constant Progression:  Worsening Chronicity:  New Relieved by:  Nothing Ineffective treatments:  OTC medications Associated symptoms: no neck pain, no sinus pain and no wheezing   Risk factors: not elderly, no chronic cardiac disease, no chronic kidney disease, no chronic respiratory disease, no diabetes mellitus, no immunosuppression, no recent illness, no recent travel and no sick contacts     Past Medical History:  Diagnosis Date  . Anxiety   . Arthritis   . Cancer ([place])    melanoma  . GERD (gastroesophageal reflux disease)   . Hypertension   . Hypothyroidism   . Mitral valve prolapse    " mild"  . PONV (postoperative nausea and vomiting)   . Sacroiliac dysfunction    left  . Spinal headache   . Wears glasses     Patient Active Problem List   Diagnosis Date Noted  . Lower back pain 03/16/2017  . Sciatica 03/16/2017  . Gastroesophageal reflux disease 11/17/2016  . Gastroesophageal reflux disease with esophagitis 11/13/2016  . Benign essential hypertension 09/18/2015  . Mixed hyperlipidemia 12/06/2014  . Palpitations 12/06/2014  . History of melanoma 11/20/2014  . Mitral valve prolapse 11/16/2013  . Basal cell carcinoma 10/21/2013  . Acquired hypothyroidism 09/12/2013  . Anxiety 09/12/2013  . Increased frequency of urination 09/12/2013    Past Surgical History:  Procedure Laterality Date  . BACK SURGERY     lumbar  decompression  . CESAREAN SECTION    . COLONOSCOPY W/ BIOPSIES AND POLYPECTOMY    . ENDOMETRIAL ABLATION    . ESOPHAGOGASTRODUODENOSCOPY (EGD) WITH PROPOFOL N/A 12/12/2016   Procedure: ESOPHAGOGASTRODUODENOSCOPY (EGD) WITH PROPOFOL;  Surgeon: Jonathon Bellows, MD;  Location: [facility] ENDOSCOPY;  Service: Endoscopy;  Laterality: N/A;  . TUBAL LIGATION      OB History   None      Home Medications    Prior to Admission medications   Medication Sig Start Date End Date Taking? Authorizing Provider  furosemide (LASIX) 20 MG tablet Take 20 mg by mouth daily.  03/12/16  Yes [provider]  furosemide (LASIX) 20 MG tablet Take by mouth. 06/25/18 06/25/19 Yes [provider]  levothyroxine (SYNTHROID, LEVOTHROID) 100 MCG tablet Take by mouth. 06/28/18 06/28/19 Yes [provider]  losartan (COZAAR) 50 MG tablet Take 50 mg by mouth at bedtime.  03/12/16  Yes [provider]  losartan (COZAAR) 50 MG tablet Take by mouth. 06/03/18  Yes [provider]  metoprolol succinate (TOPROL-XL) 50 MG 24 hr tablet Take 50 mg by mouth at bedtime.  03/12/16  Yes [provider]  pantoprazole (PROTONIX) 40 MG tablet Take by mouth. 10/10/16 07/13/18 Yes [provider]  pantoprazole (PROTONIX) 40 MG tablet Take by mouth. 01/27/18  Yes [provider]  acetaminophen (TYLENOL) 650 MG CR tablet Take 650 mg by mouth daily as needed for pain.  [provider]  azithromycin (ZITHROMAX Z-PAK) 250 MG tablet 2 tabs po once day 1, then 1 tab po qd for next 4 days 07/13/18   Norval Gable, MD  benzonatate (TESSALON) 200 MG capsule Take 1 capsule (200 mg total) by mouth 3 (three) times daily as needed. 07/13/18   Norval Gable, MD  levothyroxine (SYNTHROID, LEVOTHROID) 88 MCG tablet Take 88 mcg by mouth daily before breakfast.    [provider]  LORazepam (ATIVAN) 0.5 MG tablet Take 0.5 mg by mouth daily as needed for anxiety.  02/23/16   [provider]  meclizine (ANTIVERT) 25 MG tablet  06/25/18   [provider]    Family History Family History  Problem Relation Age of Onset  . Breast cancer Mother   . Hypertension Mother   . Hypertension Sister   . Colon cancer Brother     Social History Social History   Tobacco Use  . Smoking status: Never Smoker  . Smokeless tobacco: Never Used  Substance Use Topics  . Alcohol use: Yes    Comment: social wine  . Drug use: No     Allergies   Hydrocodone   Review of Systems Review of Systems  HENT: Positive for congestion. Negative for sinus pain.   Respiratory: Positive for cough and shortness of breath. Negative for wheezing.   Musculoskeletal: Negative for neck pain.     Physical Exam Triage Vital Signs ED Triage Vitals  Enc Vitals Group     BP 07/13/18 1315 121/71     Pulse Rate 07/13/18 1315 80     Resp 07/13/18 1315 16     Temp 07/13/18 1315 98.8 F (37.1 C)     Temp Source 07/13/18 1315 Oral     SpO2 07/13/18 1315 99 %     Weight 07/13/18 1311 192 lb (87.1 kg)     Height 07/13/18 1311 5\' 6"  (1.676 m)     Head Circumference --      Peak Flow --      Pain Score 07/13/18 1310 6     Pain Loc --      Pain Edu? --      Excl. in [place]? --    No data found.  Updated Vital Signs BP 121/71 (BP Location: Left Arm)   Pulse 80   Temp 98.8 F (37.1 C) (Oral)   Resp 16   Ht 5\' 6"  (1.676 m)   Wt 192 lb (87.1 kg)   SpO2 99%   BMI 30.99 kg/m   Visual Acuity Right Eye Distance:   Left Eye Distance:   Bilateral Distance:    Right Eye Near:   Left Eye Near:    Bilateral Near:     Physical Exam  Constitutional: She appears well-developed and well-nourished. No distress.  HENT:  Head: Normocephalic and atraumatic.  Right Ear: Tympanic membrane, external ear and ear canal normal.  Left Ear: Tympanic membrane, external ear and ear canal normal.  Nose: No mucosal edema, rhinorrhea, nose lacerations, sinus tenderness, nasal deformity, septal  deviation or nasal septal hematoma. No epistaxis.  No foreign bodies. Right sinus exhibits no maxillary sinus tenderness and no frontal sinus tenderness. Left sinus exhibits no maxillary sinus tenderness and no frontal sinus tenderness.  Mouth/Throat: Uvula is midline, oropharynx is clear and moist and mucous membranes are normal. No oropharyngeal exudate.  Neck: Normal range of motion. Neck supple. No thyromegaly present.  Cardiovascular: Normal rate, regular rhythm and normal heart sounds.  Pulmonary/Chest:  Effort normal and breath sounds normal. No stridor. No respiratory distress. She has no wheezes. She has no rales.  rhonchi  Lymphadenopathy:    She has no cervical adenopathy.  Skin: She is not diaphoretic.  Nursing note and vitals reviewed.    UC Treatments / Results  Labs (all labs ordered are listed, but only abnormal results are displayed) Labs Reviewed - No data to display  EKG None  Radiology No results found.  Procedures Procedures (including critical care time)  Medications Ordered in UC Medications - No data to display  Initial Impression / Assessment and Plan / UC Course  I have reviewed the triage vital signs and the nursing notes.  Pertinent labs & imaging results that were available during my care of the patient were reviewed by me and considered in my medical decision making (see chart for details).      Final Clinical Impressions(s) / UC Diagnoses   Final diagnoses:  Cough  Bronchitis    ED Prescriptions    Medication Sig Dispense Auth. Provider   azithromycin (ZITHROMAX Z-PAK) 250 MG tablet 2 tabs po once day 1, then 1 tab po qd for next 4 days 6 each Norval Gable, MD   benzonatate (TESSALON) 200 MG capsule Take 1 capsule (200 mg total) by mouth 3 (three) times daily as needed. 30 capsule Norval Gable, MD     1. diagnosis reviewed with patient 2. rx as per orders above; reviewed possible side effects, interactions, risks and benefits  3.  Recommend supportive treatment with rest, fluids  4. Follow-up prn if symptoms worsen or don't improve   Controlled Substance Prescriptions [place] Controlled Substance Registry consulted? Not Applicable   Norval Gable, MD 07/13/18 1357

## 2018-07-13 NOTE — ED Triage Notes (Signed)
Patient c/o cough and SOB that started on Saturday.  Patient reports chills.  Patient reports dizziness when she takes a deep breath.

## 2018-08-13 ENCOUNTER — Other Ambulatory Visit: Payer: Self-pay | Admitting: Family Medicine

## 2018-08-13 DIAGNOSIS — R5383 Other fatigue: Secondary | ICD-10-CM

## 2018-08-13 DIAGNOSIS — E039 Hypothyroidism, unspecified: Secondary | ICD-10-CM

## 2018-08-13 DIAGNOSIS — N950 Postmenopausal bleeding: Secondary | ICD-10-CM

## 2018-08-30 ENCOUNTER — Ambulatory Visit
Admission: RE | Admit: 2018-08-30 | Discharge: 2018-08-30 | Disposition: A | Discharge status: Home / Self Care | Payer: BLUE CROSS/BLUE SHIELD | Source: Ambulatory Visit | Attending: Family Medicine | Admitting: Family Medicine

## 2018-08-30 DIAGNOSIS — D259 Leiomyoma of uterus, unspecified: Secondary | ICD-10-CM | POA: Diagnosis not present

## 2018-08-30 DIAGNOSIS — R5383 Other fatigue: Secondary | ICD-10-CM

## 2018-08-30 DIAGNOSIS — N950 Postmenopausal bleeding: Secondary | ICD-10-CM | POA: Diagnosis not present

## 2018-08-30 DIAGNOSIS — N83291 Other ovarian cyst, right side: Secondary | ICD-10-CM | POA: Diagnosis not present

## 2018-08-30 DIAGNOSIS — E039 Hypothyroidism, unspecified: Secondary | ICD-10-CM | POA: Diagnosis not present

## 2018-12-29 DIAGNOSIS — I493 Ventricular premature depolarization: Secondary | ICD-10-CM | POA: Insufficient documentation

## 2019-06-13 DIAGNOSIS — R0602 Shortness of breath: Secondary | ICD-10-CM | POA: Insufficient documentation

## 2019-07-06 ENCOUNTER — Other Ambulatory Visit: Payer: Self-pay | Admitting: Family Medicine

## 2019-07-06 DIAGNOSIS — N83201 Unspecified ovarian cyst, right side: Secondary | ICD-10-CM

## 2019-07-06 DIAGNOSIS — I10 Essential (primary) hypertension: Secondary | ICD-10-CM

## 2019-08-10 ENCOUNTER — Ambulatory Visit
Admission: RE | Admit: 2019-08-10 | Discharge: 2019-08-10 | Disposition: A | Discharge status: Home / Self Care | Payer: BC Managed Care – PPO | Source: Ambulatory Visit | Attending: Family Medicine | Admitting: Family Medicine

## 2019-08-10 ENCOUNTER — Other Ambulatory Visit: Payer: Self-pay

## 2019-08-10 DIAGNOSIS — I10 Essential (primary) hypertension: Secondary | ICD-10-CM | POA: Diagnosis present

## 2019-08-10 DIAGNOSIS — N83201 Unspecified ovarian cyst, right side: Secondary | ICD-10-CM | POA: Diagnosis present

## 2019-08-30 DIAGNOSIS — M9951 Intervertebral disc stenosis of neural canal of cervical region: Secondary | ICD-10-CM | POA: Insufficient documentation

## 2020-07-11 DIAGNOSIS — N1831 Chronic kidney disease, stage 3a: Secondary | ICD-10-CM | POA: Insufficient documentation

## 2020-09-14 DIAGNOSIS — M47816 Spondylosis without myelopathy or radiculopathy, lumbar region: Secondary | ICD-10-CM | POA: Insufficient documentation

## 2020-09-14 DIAGNOSIS — Z6832 Body mass index (BMI) 32.0-32.9, adult: Secondary | ICD-10-CM | POA: Insufficient documentation

## 2021-03-25 DIAGNOSIS — M8589 Other specified disorders of bone density and structure, multiple sites: Secondary | ICD-10-CM | POA: Insufficient documentation

## 2021-03-25 DIAGNOSIS — N2581 Secondary hyperparathyroidism of renal origin: Secondary | ICD-10-CM | POA: Insufficient documentation

## 2021-04-30 DIAGNOSIS — M5116 Intervertebral disc disorders with radiculopathy, lumbar region: Secondary | ICD-10-CM | POA: Insufficient documentation

## 2021-04-30 DIAGNOSIS — M546 Pain in thoracic spine: Secondary | ICD-10-CM | POA: Insufficient documentation

## 2021-05-06 ENCOUNTER — Encounter: Payer: Self-pay | Admitting: Gastroenterology

## 2021-05-06 ENCOUNTER — Ambulatory Visit (INDEPENDENT_AMBULATORY_CARE_PROVIDER_SITE_OTHER): Payer: BC Managed Care – PPO | Admitting: Gastroenterology

## 2021-05-06 ENCOUNTER — Other Ambulatory Visit: Payer: Self-pay

## 2021-05-06 VITALS — BP 129/82 | HR 111 | Ht 66.0 in | Wt 202.6 lb

## 2021-05-06 DIAGNOSIS — R140 Abdominal distension (gaseous): Secondary | ICD-10-CM | POA: Diagnosis not present

## 2021-05-06 DIAGNOSIS — K5900 Constipation, unspecified: Secondary | ICD-10-CM

## 2021-05-06 NOTE — Progress Notes (Signed)
Jonathon Bellows MD, MRCP(U.K) [address]  [place]  [place], [place] [address]  Main: [phone]  Fax: [phone]   Gastroenterology Consultation  Referring Provider:     Hortencia Pilar, MD Primary Care Physician:  Ezequiel Kayser, MD Primary Gastroenterologist:  Dr. Jonathon Bellows  Reason for Consultation:     Abdominal pain         HPI:   Donna Cannon is a 55 y.o. y/o adult referred for consultation & management  by Dr. Ezequiel Kayser, MD.    He was last seen at the office back in 2018 for GERD on PPI.EGD 11/2016 was normal except for a few fundic gland polyps.   Labs  03/2021: B12 164: low   He had a screening colonoscopy 03/2020 at [facility], no gross abnormalities seen.  Here today to see me for bloating and abdominal distention ongoing for many months.  No weight loss  Feels like being pregnant.  No better with a bowel movement.  Alternate between constipation and diarrhea.  Equal in nature.  Passes gas foul-smelling.  Denies any use of artificial sugars or sweeteners.  Denies any NSAID use.  No clear abdominal pain rather distention leading to discomfort  Past Medical History:  Diagnosis Date  . Anxiety   . Arthritis   . Cancer ([place])    melanoma  . GERD (gastroesophageal reflux disease)   . Hypertension   . Hypothyroidism   . Mitral valve prolapse    " mild"  . PONV (postoperative nausea and vomiting)   . Sacroiliac dysfunction    left  . Spinal headache   . Wears glasses     Past Surgical History:  Procedure Laterality Date  . BACK SURGERY     lumbar decompression  . CESAREAN SECTION    . COLONOSCOPY W/ BIOPSIES AND POLYPECTOMY    . ENDOMETRIAL ABLATION    . ESOPHAGOGASTRODUODENOSCOPY (EGD) WITH PROPOFOL N/A 12/12/2016   Procedure: ESOPHAGOGASTRODUODENOSCOPY (EGD) WITH PROPOFOL;  Surgeon: Jonathon Bellows, MD;  Location: [facility] ENDOSCOPY;  Service: Endoscopy;  Laterality: N/A;  . TUBAL LIGATION      Prior to Admission medications   Medication Sig Start Date End Date  Taking? Authorizing Provider  acetaminophen (TYLENOL) 650 MG CR tablet Take 650 mg by mouth daily as needed for pain.    [provider]  azithromycin (ZITHROMAX Z-PAK) 250 MG tablet 2 tabs po once day 1, then 1 tab po qd for next 4 days 07/13/18   Norval Gable, MD  benzonatate (TESSALON) 200 MG capsule Take 1 capsule (200 mg total) by mouth 3 (three) times daily as needed. 07/13/18   Norval Gable, MD  furosemide (LASIX) 20 MG tablet Take 20 mg by mouth daily.  03/12/16   [provider]  furosemide (LASIX) 20 MG tablet Take by mouth. 06/25/18 06/25/19  [provider]  levothyroxine (SYNTHROID, LEVOTHROID) 100 MCG tablet Take by mouth. 06/28/18 06/28/19  [provider]  levothyroxine (SYNTHROID, LEVOTHROID) 88 MCG tablet Take 88 mcg by mouth daily before breakfast.    [provider]  LORazepam (ATIVAN) 0.5 MG tablet Take 0.5 mg by mouth daily as needed for anxiety.  02/23/16   [provider]  losartan (COZAAR) 50 MG tablet Take 50 mg by mouth at bedtime.  03/12/16   [provider]  losartan (COZAAR) 50 MG tablet Take by mouth. 06/03/18   [provider]  meclizine (ANTIVERT) 25 MG tablet  06/25/18   [provider]  metoprolol succinate (  TOPROL-XL) 50 MG 24 hr tablet Take 50 mg by mouth at bedtime.  03/12/16   [provider]  pantoprazole (PROTONIX) 40 MG tablet Take by mouth. 10/10/16 07/13/18  [provider]  pantoprazole (PROTONIX) 40 MG tablet Take by mouth. 01/27/18   [provider]    Family History  Problem Relation Age of Onset  . Breast cancer Mother   . Hypertension Mother   . Hypertension Sister   . Colon cancer Brother      Social History   Tobacco Use  . Smoking status: Never Smoker  . Smokeless tobacco: Never Used  Vaping Use  . Vaping Use: Never used  Substance Use Topics  . Alcohol use: Yes    Comment: social wine  . Drug use: No    Allergies as of 05/06/2021  - Review Complete 05/06/2021  Allergen Reaction Noted  . Hydrocodone Nausea Only 05/16/2016    Review of Systems:    All systems reviewed and negative except where noted in HPI.   Physical Exam:  BP 129/82 (BP Location: Left Arm, Patient Position: Sitting, Cuff Size: Large)   Pulse (!) 111   Ht 5\' 6"  (1.676 m)   Wt 202 lb 9.6 oz (91.9 kg)   BMI 32.70 kg/m  No LMP recorded. Patient is premenopausal. Psych:  Alert and cooperative. Normal mood and affect. General:   Alert,  Well-developed, well-nourished, pleasant and cooperative in NAD Head:  Normocephalic and atraumatic. Eyes:  Sclera clear, no icterus.   Conjunctiva pink. Ears:  Normal auditory acuity. Nose:  No deformity, discharge, or lesions. Lungs:  Respirations even and unlabored.  Clear throughout to auscultation.   No wheezes, crackles, or rhonchi. No acute distress. Heart:  Regular rate and rhythm; no murmurs, clicks, rubs, or gallops. Abdomen:  Normal bowel sounds.  No bruits.  Soft, non-tender and non-distended without masses, hepatosplenomegaly or hernias noted.  No guarding or rebound tenderness.    Neurologic:  Alert and oriented x3;  grossly normal neurologically. Psych:  Alert and cooperative. Normal mood and affect.  Imaging Studies: No results found.  Assessment and Plan:   Donna Cannon is a 55 y.o. y/o adult has been referred for bloating and abdominal distention.  Very likely due to either or a combination of constipation along with bacterial overgrowth syndrome.  Plan 1.  H. pylori breath test 2.  Low FODMAP diet 3.  CT abdomen and pelvis without contrast 4.  If no better at next visit we will consider empirical treatment with antibiotics for SIBO 5.  Commence on senna daily   Follow up in 6 weeks video visit  Dr Jonathon Bellows MD,MRCP(U.K)

## 2021-05-06 NOTE — Patient Instructions (Signed)
Low-FODMAP Eating Plan  FODMAP stands for fermentable oligosaccharides, disaccharides, monosaccharides, and polyols. These are sugars that are hard for some people to digest. A low-FODMAP eating plan may help some people who have irritable bowel syndrome (IBS) and certain other bowel (intestinal) diseases to manage their symptoms. This meal plan can be complicated to follow. Work with a diet and nutrition specialist (dietitian) to make a low-FODMAP eating plan that is right for you. A dietitian can help make sure that you get enough nutrition from this diet. What are tips for following this plan? Reading food labels  Check labels for hidden FODMAPs such as: ? High-fructose syrup. ? Honey. ? Agave. ? Natural fruit flavors. ? Onion or garlic powder.  Choose low-FODMAP foods that contain 3-4 grams of fiber per serving.  Check food labels for serving sizes. Eat only one serving at a time to make sure FODMAP levels stay low. Shopping  Shop with a list of foods that are recommended on this diet and make a meal plan. Meal planning  Follow a low-FODMAP eating plan for up to 6 weeks, or as told by your health care provider or dietitian.  To follow the eating plan: 1. Eliminate high-FODMAP foods from your diet completely. Choose only low-FODMAP foods to eat. You will do this for 2-6 weeks. 2. Gradually reintroduce high-FODMAP foods into your diet one at a time. Most people should wait a few days before introducing the next new high-FODMAP food into their meal plan. Your dietitian can recommend how quickly you may reintroduce foods. 3. Keep a daily record of what and how much you eat and drink. Make note of any symptoms that you have after eating. 4. Review your daily record with a dietitian regularly to identify which foods you can eat and which foods you should avoid. General tips  Drink enough fluid each day to keep your urine pale yellow.  Avoid processed foods. These often have added sugar  and may be high in FODMAPs.  Avoid most dairy products, whole grains, and sweeteners.  Work with a dietitian to make sure you get enough fiber in your diet.  Avoid high FODMAP foods at meals to manage symptoms. Recommended foods Fruits Bananas, oranges, tangerines, lemons, limes, blueberries, raspberries, strawberries, grapes, cantaloupe, honeydew melon, kiwi, papaya, passion fruit, and pineapple. Limited amounts of dried cranberries, banana chips, and shredded coconut. Vegetables Eggplant, zucchini, cucumber, peppers, green beans, bean sprouts, lettuce, arugula, kale, Swiss chard, spinach, collard greens, bok choy, summer squash, potato, and tomato. Limited amounts of corn, carrot, and sweet potato. Green parts of scallions. Grains Gluten-free grains, such as rice, oats, buckwheat, quinoa, corn, polenta, and millet. Gluten-free pasta, bread, or cereal. Rice noodles. Corn tortillas. Meats and other proteins Unseasoned beef, pork, poultry, or fish. Eggs. Berniece Salines. Tofu (firm) and tempeh. Limited amounts of nuts and seeds, such as almonds, walnuts, [place] nuts, pecans, peanuts, nut butters, pumpkin seeds, chia seeds, and sunflower seeds. Dairy Lactose-free milk, yogurt, and kefir. Lactose-free cottage cheese and ice cream. Non-dairy milks, such as almond, coconut, hemp, and rice milk. Non-dairy yogurt. Limited amounts of goat cheese, brie, mozzarella, parmesan, swiss, and other hard cheeses. Fats and oils Butter-free spreads. Vegetable oils, such as olive, canola, and sunflower oil. Seasoning and other foods Artificial sweeteners with names that do not end in "ol," such as aspartame, saccharine, and stevia. Maple syrup, white table sugar, raw sugar, brown sugar, and molasses. Mayonnaise, soy sauce, and tamari. Fresh basil, coriander, parsley, rosemary, and thyme. Beverages Water and  mineral water. Sugar-sweetened soft drinks. Small amounts of orange juice or cranberry juice. Black and green tea.  Most dry wines. Coffee. The items listed above may not be a complete list of foods and beverages you can eat. Contact a dietitian for more information. Foods to avoid Fruits Fresh, dried, and juiced forms of apple, pear, watermelon, peach, plum, cherries, apricots, blackberries, boysenberries, figs, nectarines, and mango. Avocado. Vegetables Chicory root, artichoke, asparagus, cabbage, snow peas, Brussels sprouts, broccoli, sugar snap peas, mushrooms, celery, and cauliflower. Onions, garlic, leeks, and the white part of scallions. Grains Wheat, including kamut, durum, and semolina. Barley and bulgur. Couscous. Wheat-based cereals. Wheat noodles, bread, crackers, and pastries. Meats and other proteins Fried or fatty meat. Sausage. Cashews and pistachios. Soybeans, baked beans, black beans, chickpeas, kidney beans, fava beans, navy beans, lentils, black-eyed peas, and split peas. Dairy Milk, yogurt, ice cream, and soft cheese. Cream and sour cream. Milk-based sauces. Custard. Buttermilk. Soy milk. Seasoning and other foods Any sugar-free gum or candy. Foods that contain artificial sweeteners such as sorbitol, mannitol, isomalt, or xylitol. Foods that contain honey, high-fructose corn syrup, or agave. Bouillon, vegetable stock, beef stock, and chicken stock. Garlic and onion powder. Condiments made with onion, such as hummus, chutney, pickles, relish, salad dressing, and salsa. Tomato paste. Beverages Chicory-based drinks. Coffee substitutes. Chamomile tea. Fennel tea. Sweet or fortified wines such as port or sherry. Diet soft drinks made with isomalt, mannitol, maltitol, sorbitol, or xylitol. Apple, pear, and mango juice. Juices with high-fructose corn syrup. The items listed above may not be a complete list of foods and beverages you should avoid. Contact a dietitian for more information. Summary  FODMAP stands for fermentable oligosaccharides, disaccharides, monosaccharides, and polyols. These  are sugars that are hard for some people to digest.  A low-FODMAP eating plan is a short-term diet that helps to ease symptoms of certain bowel diseases.  The eating plan usually lasts up to 6 weeks. After that, high-FODMAP foods are reintroduced gradually and one at a time. This can help you find out which foods may be causing symptoms.  A low-FODMAP eating plan can be complicated. It is best to work with a dietitian who has experience with this type of plan. This information is not intended to replace advice given to you by your health care provider. Make sure you discuss any questions you have with your health care provider. Document Revised: 04/19/2020 Document Reviewed: 04/19/2020 Elsevier Patient Education  [place].

## 2021-05-06 NOTE — Addendum Note (Signed)
Addended by: Storm Frisk on: 05/06/2021 02:14 PM   Modules accepted: Orders

## 2021-05-08 LAB — H. PYLORI BREATH TEST: H pylori Breath Test: NEGATIVE

## 2021-05-16 ENCOUNTER — Ambulatory Visit: Payer: BC Managed Care – PPO

## 2021-05-21 ENCOUNTER — Ambulatory Visit: Payer: BC Managed Care – PPO

## 2021-06-06 DIAGNOSIS — M109 Gout, unspecified: Secondary | ICD-10-CM | POA: Insufficient documentation

## 2021-06-18 ENCOUNTER — Telehealth (INDEPENDENT_AMBULATORY_CARE_PROVIDER_SITE_OTHER): Payer: BC Managed Care – PPO | Admitting: Gastroenterology

## 2021-06-18 DIAGNOSIS — K5900 Constipation, unspecified: Secondary | ICD-10-CM

## 2021-06-18 DIAGNOSIS — R140 Abdominal distension (gaseous): Secondary | ICD-10-CM

## 2021-06-18 NOTE — Progress Notes (Signed)
Donna Cannon , MD [address]  [place]  [place], [place] [address]  Main: [phone]  Fax: [phone]   Primary Care Physician: Ezequiel Kayser, MD  Virtual Visit via Video Note  I connected with patient on 06/18/21 at  1:00 PM EDT by video and verified that I am speaking with the correct person using two identifiers.   I discussed the limitations, risks, security and privacy concerns of performing an evaluation and management service by video  and the availability of in person appointments. I also discussed with the patient that there may be a patient responsible charge related to this service. The patient expressed understanding and agreed to proceed.  Location of Patient: Home Location of Provider: Home Persons involved: Patient and provider only   History of Present Illness:   Chief complaint: Abdominal pain follow-up   HPI: Donna Cannon is a 55 y.o. adult   Summary of history :  He was initially referred and seen on 05/06/2021 for abdominal pain.  Previously seen at my office in 2018 for GERD.EGD 11/2016 was normal except for a few fundic gland polyps.    Labs 03/2021: B12 164: low    He had a screening colonoscopy 03/2020 at [facility], no gross abnormalities seen.  Here today to see me for bloating and abdominal distention ongoing for many months.  No weight loss  Feels like being pregnant.  No better with a bowel movement.  Alternate between constipation and diarrhea.  Equal in nature.  Passes gas foul-smelling.  Denied any use of artificial sugars or sweeteners.  Denied any NSAID use.  No clear abdominal pain rather distention leading to discomfort    Interval history   05/06/2021-06/18/2021  05/06/2021: H. pylori breath test negative We requested a CT scan of the abdomen but was denied with insurance.  No further weight loss.  She is doing well on senna that she takes daily and a low FODMAP diet.  She has identified foods cause her more discomfort and has been  avoiding the same.  With this regime she is doing very well.    Current Outpatient Medications  Medication Sig Dispense Refill   acetaminophen (TYLENOL) 650 MG CR tablet Take 650 mg by mouth daily as needed for pain.     azithromycin (ZITHROMAX Z-PAK) 250 MG tablet 2 tabs po once day 1, then 1 tab po qd for next 4 days 6 each 0   benzonatate (TESSALON) 200 MG capsule Take 1 capsule (200 mg total) by mouth 3 (three) times daily as needed. 30 capsule 0   furosemide (LASIX) 20 MG tablet Take 20 mg by mouth daily.   3   furosemide (LASIX) 20 MG tablet Take by mouth.     levothyroxine (SYNTHROID, LEVOTHROID) 100 MCG tablet Take by mouth.     levothyroxine (SYNTHROID, LEVOTHROID) 88 MCG tablet Take 88 mcg by mouth daily before breakfast.     LORazepam (ATIVAN) 0.5 MG tablet Take 0.5 mg by mouth daily as needed for anxiety.   1   losartan (COZAAR) 50 MG tablet Take 50 mg by mouth at bedtime.   3   losartan (COZAAR) 50 MG tablet Take by mouth.     meclizine (ANTIVERT) 25 MG tablet   0   metoprolol succinate (TOPROL-XL) 50 MG 24 hr tablet Take 50 mg by mouth at bedtime.   3   pantoprazole (PROTONIX) 40 MG tablet Take by mouth.     pantoprazole (PROTONIX) 40 MG tablet Take  by mouth.     No current facility-administered medications for this visit.    Allergies as of 06/18/2021 - Review Complete 05/06/2021  Allergen Reaction Noted   Hydrocodone Nausea Only 05/16/2016    Review of Systems:    All systems reviewed and negative except where noted in HPI.  General Appearance:    Alert, cooperative, no distress, appears stated age  Head:    Normocephalic, without obvious abnormality, atraumatic  Eyes:    PERRL, conjunctiva/corneas clear,  Ears:    Grossly normal hearing    Neurologic:  Grossly normal    Observations/Objective:  Labs: CMP     Component Value Date/Time   NA 137 05/20/2016 0854   K 3.9 05/20/2016 0854   CL 109 05/20/2016 0854   CO2 19 (L) 05/20/2016 0854   GLUCOSE 92  05/20/2016 0854   BUN 11 05/20/2016 0854   CREATININE 1.12 (H) 05/20/2016 0854   CALCIUM 9.2 05/20/2016 0854   GFRNONAA 56 (L) 05/20/2016 0854   GFRAA >60 05/20/2016 0854   Lab Results  Component Value Date   WBC 7.2 05/20/2016   HGB 13.0 05/20/2016   HCT 40.0 05/20/2016   MCV 93.7 05/20/2016   PLT 309 05/20/2016    Imaging Studies: No results found.  Assessment and Plan:   Donna Cannon is a 55 y.o. y/o adult is here today to follow-up for bloating and abdominal distention.  Very likely due to either or a combination of constipation along with bacterial overgrowth syndrome.  Responded very well to a low FODMAP diet and 2 daily senna.  Informed that she can add daily dose of MiraLAX if needed in addition to the senna if feels that has not had a good bowel movement.    I discussed the assessment and treatment plan with the patient. The patient was provided an opportunity to ask questions and all were answered. The patient agreed with the plan and demonstrated an understanding of the instructions.   The patient was advised to call back or seek an in-person evaluation if the symptoms worsen or if the condition fails to improve as anticipated.  I provided 15 minutes of face-to-face time during this encounter.  Dr Donna Bellows MD,MRCP [facility]) Gastroenterology/Hepatology Pager: [phone]   Speech recognition software was used to dictate this note.

## 2021-07-08 ENCOUNTER — Other Ambulatory Visit: Payer: Self-pay

## 2021-07-08 MED ORDER — PANTOPRAZOLE SODIUM 40 MG PO TBEC: 40.0000 mg | DELAYED_RELEASE_TABLET | Freq: Every day | ORAL | 1 refills | Status: AC

## 2021-11-05 DIAGNOSIS — M4802 Spinal stenosis, cervical region: Secondary | ICD-10-CM | POA: Insufficient documentation

## 2021-11-05 DIAGNOSIS — M5412 Radiculopathy, cervical region: Secondary | ICD-10-CM | POA: Insufficient documentation

## 2022-02-03 ENCOUNTER — Ambulatory Visit: Payer: BC Managed Care – PPO | Admitting: Pain Medicine

## 2022-03-13 DIAGNOSIS — R937 Abnormal findings on diagnostic imaging of other parts of musculoskeletal system: Secondary | ICD-10-CM | POA: Insufficient documentation

## 2022-03-13 DIAGNOSIS — Z789 Other specified health status: Secondary | ICD-10-CM | POA: Insufficient documentation

## 2022-03-13 DIAGNOSIS — M899 Disorder of bone, unspecified: Secondary | ICD-10-CM | POA: Insufficient documentation

## 2022-03-13 DIAGNOSIS — Z79899 Other long term (current) drug therapy: Secondary | ICD-10-CM | POA: Insufficient documentation

## 2022-03-13 DIAGNOSIS — G894 Chronic pain syndrome: Secondary | ICD-10-CM | POA: Insufficient documentation

## 2022-03-13 NOTE — Progress Notes (Addendum)
Patient: Donna Cannon  Service Category: E/M  Provider: Gaspar Cola, MD  ?DOB: [birth year]  DOS: 03/17/2022  Referring Provider: Quintin Alto, MD  ?MRN: [id number]  Setting: Ambulatory outpatient  PCP: Hortencia Pilar, MD  ?Type: New Patient  Specialty: Interventional Pain Management    ?Location: Office  Delivery: Face-to-face    ? ?Primary Reason(s) for Visit: Encounter for initial evaluation of one or more chronic problems (new to examiner) potentially causing chronic pain, and posing a threat to normal musculoskeletal function. (Level of risk: High) ?CC: Back Pain (lower) ? ?HPI  ?Donna Cannon is a 56 y.o. year old, adult patient, who comes for the first time to our practice referred by Quintin Alto, MD for our initial evaluation of his chronic pain. 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; Lumbar foraminal stenosis (L4-5) (Bilateral); DDD (degenerative disc disease), lumbosacral; Cervical facet hypertrophy; DDD (degenerative disc disease), cervical; Chronic feet pain (5th area of Pain) (Bilateral); Chronic wrist pain (7th area of Pain) (Right); Chronic low back pain (1ry area of Pain) (Bilateral) (L>R) w/o sciatica; Failed back surgical syndrome (2015); Chronic lower extremity pain (2ry area of Pain) (Bilateral) (L>R); Chronic lower extremity radicular pain (S1 dermatome) (Bilateral); Chronic upper extremity pain (Left); Chronic neck pain (4th area of Pain) (Posterior) (Bilateral) (L>R); Chronic ankle pain (6th area of Pain) (Bilateral); Grade 1 Retrolisthesis of cervical spine (C3/C4 and C5/C6); Decreased range of motion of lumbar spine; Painful cervical range of motion; Impaired range of motion of cervical spine; Lumbar facet joint syndrome; Chronic hip pain (Bilateral); and Chronic sacroiliac joint pain (Bilateral) on their problem list. Today he comes in for evaluation of his Back Pain (lower) ? ?Pain Assessment: ?Location: Right, Left, Lower, Upper, Mid Back ?Radiating: pain radiaities everywhere ?Onset: More than a month ago ?Duration: Chronic pain ?Quality: Constant, Dull, Aching, Sharp, Shooting ?Severity: 5 /10 (subjective, self-reported pain score)  ?Effect on ADL: limits my daily activities ?Timing: Constant ?Modifying factors: sit on heating pad ?BP: 128/68  HR: 66 ? ?Onset and Duration: Gradual and Present longer than 3 months ?Cause of pain: Unknown ?Severity: Getting worse, NAS-11 at its worse: 8/10, NAS-11 at its best: 3/10, and NAS-11 on the average: 6/10 ?Timing: Not influenced by the time of the day, During activity or exercise, After activity or exercise, and After a period of immobility ?Aggravating Factors: Bending, Climbing, Lifiting, Motion, Prolonged sitting, Prolonged standing, Twisting,  Walking, Walking uphill, Walking downhill, and Working ?Alleviating Factors: Hot packs, Lying down, Resting, Sleeping, and Warm showers or baths ?Associated Problems: Day-time cramps, Night-time cramps, Dizziness, Fatigue, Numbness, Spasms, Swelling, Tingling, Weakness, Pain that wakes patient  up, and Pain that does not allow patient to sleep ?Quality of Pain: Aching, Agonizing, Annoying, Burning, Constant, Cramping, Deep, Distressing, Dull, Exhausting, Heavy, Horrible, Nagging, Pressure-like, Pulsating, Sharp, Shooting, Stabbing, Tender, Throbbing, Tingling, Tiring, and Uncomfortable ?Previous Examinations or Tests: Bone scan, MRI scan, X-rays, and Orthopedic evaluation ?Previous Treatments: Epidural steroid injections and Steroid treatments by mouth ? ?According to the patient the primary area of pain is that of the lower back (Bilateral) (L>R). into the patient indicates having had an L4-5 laminectomy by Dr. Rolena Infante [facility] orth[facility]) around 2015.  She also admits to having had physical therapy for approximately 6 weeks at [facility] physical therapy in [place] around 2021.  This physical therapy did not help her low back pain.  The patient also indicates having had imaging studies done that are less than 43 years old.  The patient also indicates having had injections in the lower back done by Dr. Oleta Mouse at [place]. ?Review of the electronic medical records indicate the following interventional treatments: ?Diagnostic/therapeutic bilateral L4-5 IA facet inj. (12/12/2019) (by Ashley Jacobs, MD) ([facility])  ?Diagnostic/therapeutic left L4-5 and L5-S1 TFESI (05/22/2021) (by Ashley Jacobs, MD) ([facility])  ? ?The patient's secondary area pain is that of the lower extremities (Bilateral) (L>R).  The patient indicates that in both lower extremities to pain runs down through the anterior portion of her thigh and then runs all through the back of the leg to the bottom of her feet in what appears to  be a bilateral S1 dermatomal distribution.  The patient denies any lower extremity surgeries, recent imaging, nerve blocks, joint injections, but she does admit to the above-mentioned physical therapy.  The patient denies any lower extremity nerve conduction testing. ? ?The patient's third area pain is that of the upper extremity (Left).  She denies any surgeries, recent x-rays, nerve blocks, joint injections, or any physical therapy. ? ?The patient's fourth area pain is that of the neck (posterior aspect) (Bilateral) (L>R).  She indicates having had some recent imaging done at [facility].  She denies any physical therapy, nerve blocks, or surgeries.  She does admit radiation of the pain down the left upper extremity.  She also admits to having occipital headaches (Bilateral) (R>L). ? ?The patient's fifth area pain is that of her feet (Bilateral) (R=L).  She describes having pain into her right big toe which she attributes to gout.  However, there is a possibility that this could be an L5 radiculopathy/radiculitis. ? ?The patient's sixth area of pain is that of the ankles (Bilateral).  She denies any recent x-rays, physical therapy, nerve blocks or joint injections, or having had any surgery in her ankles. ? ?The patient's seventh area pain is that of the right wrist.  She denies any recent x-rays, surgeries, nerve blocks, joint injections, or physical therapy. ? ?Physical exam: The patient was able to toe walk and heel walk without any problems.  She did experience some pain in her big toe, but other than that she was able to keep her heels off of the floor.  Straight leg raise was positive bilaterally for pain being referred all the way down into her feet and ankle.  Provocative hyperextension and rotation maneuver and Kemp maneuver were both positive bilaterally for ipsilateral lumbar facet arthralgia.  Provocative Patrick maneuver was also positive bilaterally for sacroiliac joint arthralgia and hip joint  arthralgia. ? ?Today I took the time to provide the patient with information regarding my pain practice. The patient was informed  that my practice is divided into two sections: an interventional pain management section,

## 2022-03-17 ENCOUNTER — Encounter: Payer: Self-pay | Admitting: Pain Medicine

## 2022-03-17 ENCOUNTER — Ambulatory Visit
Admission: RE | Admit: 2022-03-17 | Discharge: 2022-03-17 | Disposition: A | Discharge status: Home / Self Care | Payer: BC Managed Care – PPO | Source: Ambulatory Visit | Attending: Pain Medicine | Admitting: Pain Medicine

## 2022-03-17 ENCOUNTER — Ambulatory Visit (HOSPITAL_BASED_OUTPATIENT_CLINIC_OR_DEPARTMENT_OTHER): Payer: BC Managed Care – PPO | Admitting: Pain Medicine

## 2022-03-17 ENCOUNTER — Other Ambulatory Visit
Admission: RE | Admit: 2022-03-17 | Discharge: 2022-03-17 | Disposition: A | Discharge status: Home / Self Care | Payer: BC Managed Care – PPO | Source: Ambulatory Visit | Attending: Pain Medicine | Admitting: Pain Medicine

## 2022-03-17 VITALS — BP 128/68 | HR 66 | Temp 97.4°F | Resp 14 | Ht 66.0 in | Wt 200.0 lb

## 2022-03-17 DIAGNOSIS — M542 Cervicalgia: Secondary | ICD-10-CM | POA: Insufficient documentation

## 2022-03-17 DIAGNOSIS — M5137 Other intervertebral disc degeneration, lumbosacral region: Secondary | ICD-10-CM

## 2022-03-17 DIAGNOSIS — M79604 Pain in right leg: Secondary | ICD-10-CM | POA: Insufficient documentation

## 2022-03-17 DIAGNOSIS — R748 Abnormal levels of other serum enzymes: Secondary | ICD-10-CM | POA: Insufficient documentation

## 2022-03-17 DIAGNOSIS — M25531 Pain in right wrist: Secondary | ICD-10-CM | POA: Insufficient documentation

## 2022-03-17 DIAGNOSIS — Z789 Other specified health status: Secondary | ICD-10-CM

## 2022-03-17 DIAGNOSIS — G894 Chronic pain syndrome: Secondary | ICD-10-CM

## 2022-03-17 DIAGNOSIS — M899 Disorder of bone, unspecified: Secondary | ICD-10-CM | POA: Insufficient documentation

## 2022-03-17 DIAGNOSIS — M109 Gout, unspecified: Secondary | ICD-10-CM | POA: Insufficient documentation

## 2022-03-17 DIAGNOSIS — M79671 Pain in right foot: Secondary | ICD-10-CM | POA: Insufficient documentation

## 2022-03-17 DIAGNOSIS — M5410 Radiculopathy, site unspecified: Secondary | ICD-10-CM

## 2022-03-17 DIAGNOSIS — Z79899 Other long term (current) drug therapy: Secondary | ICD-10-CM

## 2022-03-17 DIAGNOSIS — M47812 Spondylosis without myelopathy or radiculopathy, cervical region: Secondary | ICD-10-CM | POA: Insufficient documentation

## 2022-03-17 DIAGNOSIS — M25571 Pain in right ankle and joints of right foot: Secondary | ICD-10-CM | POA: Insufficient documentation

## 2022-03-17 DIAGNOSIS — R944 Abnormal results of kidney function studies: Secondary | ICD-10-CM | POA: Insufficient documentation

## 2022-03-17 DIAGNOSIS — M48061 Spinal stenosis, lumbar region without neurogenic claudication: Secondary | ICD-10-CM

## 2022-03-17 DIAGNOSIS — M4310 Spondylolisthesis, site unspecified: Secondary | ICD-10-CM | POA: Insufficient documentation

## 2022-03-17 DIAGNOSIS — M533 Sacrococcygeal disorders, not elsewhere classified: Secondary | ICD-10-CM | POA: Insufficient documentation

## 2022-03-17 DIAGNOSIS — G8929 Other chronic pain: Secondary | ICD-10-CM | POA: Insufficient documentation

## 2022-03-17 DIAGNOSIS — R937 Abnormal findings on diagnostic imaging of other parts of musculoskeletal system: Secondary | ICD-10-CM | POA: Insufficient documentation

## 2022-03-17 DIAGNOSIS — M79672 Pain in left foot: Secondary | ICD-10-CM | POA: Insufficient documentation

## 2022-03-17 DIAGNOSIS — M25551 Pain in right hip: Secondary | ICD-10-CM

## 2022-03-17 DIAGNOSIS — M47817 Spondylosis without myelopathy or radiculopathy, lumbosacral region: Secondary | ICD-10-CM | POA: Insufficient documentation

## 2022-03-17 DIAGNOSIS — M961 Postlaminectomy syndrome, not elsewhere classified: Secondary | ICD-10-CM | POA: Insufficient documentation

## 2022-03-17 DIAGNOSIS — M25552 Pain in left hip: Secondary | ICD-10-CM

## 2022-03-17 DIAGNOSIS — M79602 Pain in left arm: Secondary | ICD-10-CM | POA: Insufficient documentation

## 2022-03-17 DIAGNOSIS — M79605 Pain in left leg: Secondary | ICD-10-CM

## 2022-03-17 DIAGNOSIS — M47816 Spondylosis without myelopathy or radiculopathy, lumbar region: Secondary | ICD-10-CM | POA: Insufficient documentation

## 2022-03-17 DIAGNOSIS — M5030 Other cervical disc degeneration, unspecified cervical region: Secondary | ICD-10-CM

## 2022-03-17 DIAGNOSIS — M545 Low back pain, unspecified: Secondary | ICD-10-CM

## 2022-03-17 DIAGNOSIS — M5382 Other specified dorsopathies, cervical region: Secondary | ICD-10-CM

## 2022-03-17 DIAGNOSIS — M5386 Other specified dorsopathies, lumbar region: Secondary | ICD-10-CM | POA: Insufficient documentation

## 2022-03-17 DIAGNOSIS — M25572 Pain in left ankle and joints of left foot: Secondary | ICD-10-CM

## 2022-03-17 LAB — COMPREHENSIVE METABOLIC PANEL
ALT: 104 U/L — ABNORMAL HIGH (ref 0–44)
AST: 69 U/L — ABNORMAL HIGH (ref 15–41)
Albumin: 4.7 g/dL (ref 3.5–5.0)
Alkaline Phosphatase: 88 U/L (ref 38–126)
Anion gap: 14 (ref 5–15)
BUN: 14 mg/dL (ref 6–20)
CO2: 21 mmol/L — ABNORMAL LOW (ref 22–32)
Calcium: 10 mg/dL (ref 8.9–10.3)
Chloride: 105 mmol/L (ref 98–111)
Creatinine, Ser: 1.1 mg/dL — ABNORMAL HIGH (ref 0.44–1.00)
GFR, Estimated: 59 mL/min — ABNORMAL LOW (ref >60)
Glucose, Bld: 96 mg/dL (ref 70–99)
Potassium: 4.3 mmol/L (ref 3.5–5.1)
Sodium: 140 mmol/L (ref 135–145)
Total Bilirubin: 0.8 mg/dL (ref 0.3–1.2)
Total Protein: 7.9 g/dL (ref 6.5–8.1)

## 2022-03-17 LAB — C-REACTIVE PROTEIN: CRP: 0.8 mg/dL (ref <1.0)

## 2022-03-17 LAB — SEDIMENTATION RATE: Sed Rate: 7 mm/hr (ref 0–30)

## 2022-03-17 LAB — VITAMIN B12: Vitamin B-12: 1265 pg/mL — ABNORMAL HIGH (ref 180–914)

## 2022-03-17 LAB — MAGNESIUM: Magnesium: 2 mg/dL (ref 1.7–2.4)

## 2022-03-17 NOTE — Progress Notes (Signed)
Safety precautions to be maintained throughout the outpatient stay will include: orient to surroundings, keep bed in low position, maintain call bell within reach at all times, provide assistance with transfer out of bed and ambulation.  

## 2022-03-21 LAB — COMPLIANCE DRUG ANALYSIS, UR

## 2022-03-22 LAB — 25-HYDROXY VITAMIN D LCMS D2+D3
25-Hydroxy, Vitamin D-2: 61 ng/mL
25-Hydroxy, Vitamin D-3: 5.7 ng/mL
25-Hydroxy, Vitamin D: 67 ng/mL

## 2022-04-13 NOTE — Progress Notes (Signed)
PROVIDER NOTE: Information contained herein reflects review and annotations entered in association with encounter. Interpretation of such information and data should be left to medically-trained personnel. Information provided to patient can be located elsewhere in the medical record under "Patient Instructions". Document created using STT-dictation technology, any transcriptional errors that may result from process are unintentional.  ?  ?Patient: Donna Cannon  Service Category: E/M  Provider: Gaspar Cola, MD  ?DOB: 1966/04/14  DOS: 04/14/2022  Specialty: Interventional Pain Management  ?MRN: [id number]  Setting: Ambulatory outpatient  PCP: Hortencia Pilar, MD  ?Type: Established Patient    Referring Provider: Hortencia Pilar, MD  ?Location: Office  Delivery: Face-to-face    ? ?Primary Reason(s) for Visit: Encounter for evaluation before starting new chronic pain management plan of care (Level of risk: moderate) ?CC: Generalized Body Aches (back) and Back Pain ? ?HPI  ?Mr. Chenard is a 56 y.o. year old, adult patient, who comes today for a follow-up evaluation to review the test results and decide on a treatment plan. 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; Radiculopathy of cervical region; Spinal stenosis of cervical region; Osteopenia of multiple sites; PVC (premature ventricular contraction); Secondary hyperparathyroidism of renal origin ([place]); SOBOE (shortness of  breath on exertion); Spondylosis of lumbar region without myelopathy or radiculopathy; Stage 3a chronic kidney disease ([place]); Thoracic spine pain; Chronic pain syndrome; Pharmacologic therapy; Disorder of skeletal system; Problems influencing health status; Abnormal MRI, cervical spine (11/18/2021); Abnormal MRI, lumbar spine (10/16/2019); Lumbosacral facet arthropathy (Multilevel) (Bilateral); 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; Lumbar foraminal stenosis (L4-5) (Bilateral); DDD (degenerative disc disease), lumbosacral; Cervical facet hypertrophy; DDD (degenerative disc disease), cervical; Chronic feet pain (5th area of Pain) (Bilateral); Chronic wrist pain (7th area of Pain) (Right); Chronic low back pain (1ry area of Pain) (Bilateral) (L>R) w/o sciatica; Failed back surgical syndrome (2015); Chronic lower extremity pain (2ry area of Pain) (Bilateral) (L>R); Chronic lower extremity radicular pain (S1 dermatome) (Bilateral); Chronic upper extremity pain (3ry area of Pain) (Left); Chronic neck pain (4th area of Pain) (Posterior) (Bilateral) (L>R); Chronic ankle pain (6th area of Pain) (Bilateral); Grade 1 Retrolisthesis of cervical spine (C3/C4 and C5/C6); Decreased range of motion of lumbar spine; Painful cervical range of motion; Impaired range of motion of cervical spine; Lumbar facet joint syndrome; Chronic hip pain (Bilateral); Chronic sacroiliac joint pain (Bilateral); Decreased GFR; and Elevated liver enzymes on their problem list. His primarily concern today is the Generalized Body Aches (back) and Back Pain ? ?Pain Assessment: ?Location: Right, Left, Lower Back ?Radiating: pain radiaties everywhere ?Onset: More than a month ago ?Duration: Chronic pain ?Quality: Aching, Sharp, Shooting, Constant, Dull ?Severity: 8 /10 (subjective, self-reported pain score)  ?Effect on ADL: limits my daily activities ?Timing: Constant ?Modifying factors: sitting on heating pad and laying down flat ?BP:  125/72  HR: 66 ? ?Mr. Kiger comes in today for a follow-up visit after his initial evaluation on 03/17/2022. Today we went over the results of his tests. These were explained in "Layman's terms". During today's appointment we went over my diagnostic impression, as well as the proposed treatment plan. ? ?  Review of initial evaluation (03/17/2022): "According to the patient the primary area of pain is that of the lower back (Bilateral) (L>R). into the patient indicates having had an L4-5 laminectomy by Dr. Rolena Infante [facility]) around 2015.  She also admits to having had physical therapy for approximately 6 weeks at [facility] physical therapy in [place] around 2021.  This physical therapy did not help her low back pain.  The patient also indicates having had imaging studies done that are less than 30 years old.  The patient also indicates having had injections in the lower back done by Dr. Oleta Mouse at [place]. ?Review of the electronic medical records indicate the following interventional treatments: ?Diagnostic/therapeutic bilateral L4-5 IA facet inj. (12/12/2019) (by Ashley Jacobs, MD) ([facility])  ?Diagnostic/therapeutic left L4-5 and L5-S1 TFESI (05/22/2021) (by Ashley Jacobs, MD) ([facility])  ? ?The patient's secondary area pain is that of the lower extremities (Bilateral) (L>R).  The patient indicates that in both lower extremities to pain runs down through the anterior portion of her thigh and then runs all through the back of the leg to the bottom of her feet in what appears to be a bilateral S1 dermatomal distribution.  The patient denies any lower extremity surgeries, recent imaging, nerve blocks, joint injections, but she does admit to the above-mentioned physical therapy.  The patient denies any lower extremity nerve conduction testing. ? ?The patient's third area pain is that of the upper extremity (Left).  She denies any surgeries, recent x-rays, nerve blocks, joint injections, or  any physical therapy. ? ?The patient's fourth area pain is that of the neck (posterior aspect) (Bilateral) (L>R).  She indicates having had some recent imaging done at [facility].  She denies any physical therapy, nerve blocks, or surgeries.  She does admit radiation of the pain down the left upper extremity.  She also admits to having occipital headaches (Bilateral) (R>L). ? ?The patient's fifth area pain is that of her feet (Bilateral) (R=L).  She describes having pain into her right big toe which she attributes to gout.  However, there is a possibility that this could be an L5 radiculopathy/radiculitis. ? ?The patient's sixth area of pain is that of the ankles (Bilateral).  She denies any recent x-rays, physical therapy, nerve blocks or joint injections, or having had any surgery in her ankles. ? ?The patient's seventh area pain is that of the right wrist.  She denies any recent x-rays, surgeries, nerve blocks, joint injections, or physical therapy. ? ?Physical exam: The patient was able to toe walk and heel walk without any problems.  She did experience some pain in her big toe, but other than that she was able to keep her heels off of the floor.  Straight leg raise was positive bilaterally for pain being referred all the way down into her feet and ankle.  Provocative hyperextension and rotation maneuver and Kemp maneuver were both positive bilaterally for ipsilateral lumbar facet arthralgia. Provocative Patrick maneuver was also positive bilaterally for sacroiliac joint arthralgia and hip joint arthralgia. ? ?In considering the treatment plan options, Mr. Brinker was reminded that I no longer take patients for medication management only. I asked him to let me know if he had no intention of taking advantage of the interventional therapies, so that we could make arrangements to provide this space to someone interested. I also made it clear that undergoing interventional therapies for the purpose of getting pain  medications is very inappropriate on the part of  a patient, and it will not be tolerated in this practice. This type of behavior would suggest true addiction and therefore it requires referral to an addiction speciali

## 2022-04-14 ENCOUNTER — Ambulatory Visit: Payer: BC Managed Care – PPO | Attending: Pain Medicine | Admitting: Pain Medicine

## 2022-04-14 ENCOUNTER — Encounter: Payer: Self-pay | Admitting: Pain Medicine

## 2022-04-14 VITALS — BP 125/72 | HR 66 | Temp 97.2°F | Ht 66.0 in | Wt 200.0 lb

## 2022-04-14 DIAGNOSIS — M542 Cervicalgia: Secondary | ICD-10-CM | POA: Diagnosis present

## 2022-04-14 DIAGNOSIS — M25572 Pain in left ankle and joints of left foot: Secondary | ICD-10-CM | POA: Diagnosis present

## 2022-04-14 DIAGNOSIS — R937 Abnormal findings on diagnostic imaging of other parts of musculoskeletal system: Secondary | ICD-10-CM | POA: Insufficient documentation

## 2022-04-14 DIAGNOSIS — M79602 Pain in left arm: Secondary | ICD-10-CM | POA: Insufficient documentation

## 2022-04-14 DIAGNOSIS — M25571 Pain in right ankle and joints of right foot: Secondary | ICD-10-CM | POA: Insufficient documentation

## 2022-04-14 DIAGNOSIS — M79672 Pain in left foot: Secondary | ICD-10-CM | POA: Diagnosis present

## 2022-04-14 DIAGNOSIS — M79671 Pain in right foot: Secondary | ICD-10-CM | POA: Insufficient documentation

## 2022-04-14 DIAGNOSIS — M545 Low back pain, unspecified: Secondary | ICD-10-CM | POA: Diagnosis not present

## 2022-04-14 DIAGNOSIS — M25531 Pain in right wrist: Secondary | ICD-10-CM | POA: Insufficient documentation

## 2022-04-14 DIAGNOSIS — M79605 Pain in left leg: Secondary | ICD-10-CM | POA: Insufficient documentation

## 2022-04-14 DIAGNOSIS — M79604 Pain in right leg: Secondary | ICD-10-CM | POA: Diagnosis present

## 2022-04-14 DIAGNOSIS — M47817 Spondylosis without myelopathy or radiculopathy, lumbosacral region: Secondary | ICD-10-CM | POA: Insufficient documentation

## 2022-04-14 DIAGNOSIS — G894 Chronic pain syndrome: Secondary | ICD-10-CM | POA: Insufficient documentation

## 2022-04-14 DIAGNOSIS — G8929 Other chronic pain: Secondary | ICD-10-CM | POA: Insufficient documentation

## 2022-04-14 DIAGNOSIS — M47816 Spondylosis without myelopathy or radiculopathy, lumbar region: Secondary | ICD-10-CM | POA: Insufficient documentation

## 2022-04-14 NOTE — Progress Notes (Signed)
Safety precautions to be maintained throughout the outpatient stay will include: orient to surroundings, keep bed in low position, maintain call bell within reach at all times, provide assistance with transfer out of bed and ambulation.  

## 2022-04-14 NOTE — Patient Instructions (Signed)
______________________________________________________________________ ? ?Preparing for Procedure with Sedation ? ?NOTICE: Due to recent regulatory changes, starting on July 15, 2021, procedures requiring intravenous (IV) sedation will no longer be performed at the [facility].  These types of procedures are required to be performed at [facility] ambulatory surgery facility.  We are very sorry for the inconvenience. ? ?Procedure appointments are limited to planned procedures: ?No Prescription Refills. ?No disability issues will be discussed. ?No medication changes will be discussed. ? ?Instructions: ?Oral Intake: Do not eat or drink anything for at least 8 hours prior to your procedure. (Exception: Blood Pressure Medication. See below.) ?Transportation: A driver is required. You may not drive yourself after the procedure. ?Blood Pressure Medicine: Do not forget to take your blood pressure medicine with a sip of water the morning of the procedure. If your Diastolic (lower reading) is above 100 mmHg, elective cases will be cancelled/rescheduled. ?Blood thinners: These will need to be stopped for procedures. Notify our staff if you are taking any blood thinners. Depending on which one you take, there will be specific instructions on how and when to stop it. ?Diabetics on insulin: Notify the staff so that you can be scheduled 1st case in the morning. If your diabetes requires high dose insulin, take only ? of your normal insulin dose the morning of the procedure and notify the staff that you have done so. ?Preventing infections: Shower with an antibacterial soap the morning of your procedure. ?Build-up your immune system: Take 1000 mg of Vitamin C with every meal (3 times a day) the day prior to your procedure. ?Antibiotics: Inform the staff if you have a condition or reason that requires you to take antibiotics before dental procedures. ?Pregnancy: If you are pregnant, call and cancel the procedure. ?Sickness: If  you have a cold, fever, or any active infections, call and cancel the procedure. ?Arrival: You must be in the facility at least 30 minutes prior to your scheduled procedure. ?Children: Do not bring children with you. ?Dress appropriately: Bring dark clothing that you would not mind if they get stained. ?Valuables: Do not bring any jewelry or valuables. ? ?Reasons to call and reschedule or cancel your procedure: (Following these recommendations will minimize the risk of a serious complication.) ?Surgeries: Avoid having procedures within 2 weeks of any surgery. (Avoid for 2 weeks before or after any surgery). ?Flu Shots: Avoid having procedures within 2 weeks of a flu shots. (Avoid for 2 weeks before or after immunizations). ?Barium: Avoid having a procedure within 7-10 days after having had a radiological study involving the use of radiological contrast. (Myelograms, Barium swallow or enema study). ?Heart attacks: Avoid any elective procedures or surgeries for the initial 6 months after a "Myocardial Infarction" (Heart Attack). ?Blood thinners: It is imperative that you stop these medications before procedures. Let us know if you if you take any blood thinner.  ?Infection: Avoid procedures during or within two weeks of an infection (including chest colds or gastrointestinal problems). Symptoms associated with infections include: Localized redness, fever, chills, night sweats or profuse sweating, burning sensation when voiding, cough, congestion, stuffiness, runny nose, sore throat, diarrhea, nausea, vomiting, cold or Flu symptoms, recent or current infections. It is specially important if the infection is over the area that we intend to treat. ?Heart and lung problems: Symptoms that may suggest an active cardiopulmonary problem include: cough, chest pain, breathing difficulties or shortness of breath, dizziness, ankle swelling, uncontrolled high or unusually low blood pressure, and/or palpitations. If you are    experiencing any of these symptoms, cancel your procedure and contact your primary care physician for an evaluation. ? ?Remember:  ?Regular Business hours are:  ?Monday to Thursday 8:00 AM to 4:00 PM ? ?Provider's Schedule: ?Catelin Manthe, MD:  ?Procedure days: Tuesday and Thursday 7:30 AM to 4:00 PM ? ?Bilal Lateef, MD:  ?Procedure days: Monday and Wednesday 7:30 AM to 4:00 PM ?______________________________________________________________________ ? ____________________________________________________________________________________________ ? ?General Risks and Possible Complications ? ?Patient Responsibilities: It is important that you read this as it is part of your informed consent. It is our duty to inform you of the risks and possible complications associated with treatments offered to you. It is your responsibility as a patient to read this and to ask questions about anything that is not clear or that you believe was not covered in this document. ? ?Patient?s Rights: You have the right to refuse treatment. You also have the right to change your mind, even after initially having agreed to have the treatment done. However, under this last option, if you wait until the last second to change your mind, you may be charged for the materials used up to that point. ? ?Introduction: Medicine is not an exact science. Everything in Medicine, including the lack of treatment(s), carries the potential for danger, harm, or loss (which is by definition: Risk). In Medicine, a complication is a secondary problem, condition, or disease that can aggravate an already existing one. All treatments carry the risk of possible complications. The fact that a side effects or complications occurs, does not imply that the treatment was conducted incorrectly. It must be clearly understood that these can happen even when everything is done following the highest safety standards. ? ?No treatment: You can choose not to proceed with the  proposed treatment alternative. The ?PRO(s)? would include: avoiding the risk of complications associated with the therapy. The ?CON(s)? would include: not getting any of the treatment benefits. These benefits fall under one of three categories: diagnostic; therapeutic; and/or palliative. Diagnostic benefits include: getting information which can ultimately lead to improvement of the disease or symptom(s). Therapeutic benefits are those associated with the successful treatment of the disease. Finally, palliative benefits are those related to the decrease of the primary symptoms, without necessarily curing the condition (example: decreasing the pain from a flare-up of a chronic condition, such as incurable terminal cancer). ? ?General Risks and Complications: These are associated to most interventional treatments. They can occur alone, or in combination. They fall under one of the following six (6) categories: no benefit or worsening of symptoms; bleeding; infection; nerve damage; allergic reactions; and/or death. ?No benefits or worsening of symptoms: In Medicine there are no guarantees, only probabilities. No healthcare provider can ever guarantee that a medical treatment will work, they can only state the probability that it may. Furthermore, there is always the possibility that the condition may worsen, either directly, or indirectly, as a consequence of the treatment. ?Bleeding: This is more common if the patient is taking a blood thinner, either prescription or over the counter (example: Goody Powders, Fish oil, Aspirin, Garlic, etc.), or if suffering a condition associated with impaired coagulation (example: Hemophilia, cirrhosis of the liver, low platelet counts, etc.). However, even if you do not have one on these, it can still happen. If you have any of these conditions, or take one of these drugs, make sure to notify your treating physician. ?Infection: This is more common in patients with a compromised  immune system, either due to disease (example:   diabetes, cancer, human immunodeficiency virus [HIV], etc.), or due to medications or treatments (example: therapies used to treat cancer and rheumatological diseas

## 2022-05-22 ENCOUNTER — Ambulatory Visit
Admission: RE | Admit: 2022-05-22 | Discharge: 2022-05-22 | Disposition: A | Discharge status: Home / Self Care | Payer: BC Managed Care – PPO | Source: Ambulatory Visit | Attending: Pain Medicine | Admitting: Pain Medicine

## 2022-05-22 ENCOUNTER — Ambulatory Visit: Payer: BC Managed Care – PPO | Attending: Pain Medicine | Admitting: Pain Medicine

## 2022-05-22 ENCOUNTER — Encounter: Payer: Self-pay | Admitting: Pain Medicine

## 2022-05-22 ENCOUNTER — Other Ambulatory Visit: Payer: Self-pay

## 2022-05-22 VITALS — BP 111/94 | HR 64 | Temp 97.9°F | Resp 13 | Ht 66.0 in | Wt 200.0 lb

## 2022-05-22 DIAGNOSIS — M5386 Other specified dorsopathies, lumbar region: Secondary | ICD-10-CM | POA: Insufficient documentation

## 2022-05-22 DIAGNOSIS — M5137 Other intervertebral disc degeneration, lumbosacral region: Secondary | ICD-10-CM | POA: Diagnosis present

## 2022-05-22 DIAGNOSIS — M47816 Spondylosis without myelopathy or radiculopathy, lumbar region: Secondary | ICD-10-CM | POA: Diagnosis not present

## 2022-05-22 DIAGNOSIS — M545 Low back pain, unspecified: Secondary | ICD-10-CM | POA: Insufficient documentation

## 2022-05-22 DIAGNOSIS — M961 Postlaminectomy syndrome, not elsewhere classified: Secondary | ICD-10-CM | POA: Diagnosis present

## 2022-05-22 DIAGNOSIS — G8929 Other chronic pain: Secondary | ICD-10-CM | POA: Diagnosis present

## 2022-05-22 DIAGNOSIS — R937 Abnormal findings on diagnostic imaging of other parts of musculoskeletal system: Secondary | ICD-10-CM | POA: Diagnosis present

## 2022-05-22 MED ORDER — TRIAMCINOLONE ACETONIDE 40 MG/ML IJ SUSP
INTRAMUSCULAR | Status: AC
  Filled 2022-05-22: qty 1

## 2022-05-22 MED ORDER — MIDAZOLAM HCL 5 MG/5ML IJ SOLN
0.5000 mg | Freq: Once | INTRAMUSCULAR | Status: AC
  Administered 2022-05-22: 3 mg via INTRAVENOUS

## 2022-05-22 MED ORDER — LIDOCAINE HCL 2 % IJ SOLN
INTRAMUSCULAR | Status: AC
  Filled 2022-05-22: qty 20

## 2022-05-22 MED ORDER — MIDAZOLAM HCL 5 MG/5ML IJ SOLN
INTRAMUSCULAR | Status: AC
  Filled 2022-05-22: qty 5

## 2022-05-22 MED ORDER — ROPIVACAINE HCL 2 MG/ML IJ SOLN
18.0000 mL | Freq: Once | INTRAMUSCULAR | Status: AC
  Administered 2022-05-22: 18 mL via PERINEURAL

## 2022-05-22 MED ORDER — LACTATED RINGERS IV SOLN: Freq: Once | INTRAVENOUS | Status: AC

## 2022-05-22 MED ORDER — ROPIVACAINE HCL 2 MG/ML IJ SOLN
INTRAMUSCULAR | Status: AC
  Filled 2022-05-22: qty 20

## 2022-05-22 MED ORDER — FENTANYL CITRATE (PF) 100 MCG/2ML IJ SOLN
25.0000 ug | INTRAMUSCULAR | Status: DC | PRN
  Administered 2022-05-22: 50 ug via INTRAVENOUS

## 2022-05-22 MED ORDER — LIDOCAINE HCL 2 % IJ SOLN
20.0000 mL | Freq: Once | INTRAMUSCULAR | Status: AC
  Administered 2022-05-22: 400 mg

## 2022-05-22 MED ORDER — FENTANYL CITRATE (PF) 100 MCG/2ML IJ SOLN
INTRAMUSCULAR | Status: AC
Start: 2022-05-22 — End: ?
  Filled 2022-05-22: qty 2

## 2022-05-22 MED ORDER — TRIAMCINOLONE ACETONIDE 40 MG/ML IJ SUSP
80.0000 mg | Freq: Once | INTRAMUSCULAR | Status: AC
  Administered 2022-05-22: 80 mg

## 2022-05-22 NOTE — Progress Notes (Signed)
PROVIDER NOTE: Interpretation of information contained herein should be left to medically-trained personnel. Specific patient instructions are provided elsewhere under "Patient Instructions" section of medical record. This document was created in part using STT-dictation technology, any transcriptional errors that may result from this process are unintentional.  Patient: Donna Cannon Type: Established DOB: 07-28-1966 MRN: [id number] PCP: Hortencia Pilar, MD  Service: Procedure DOS: 05/22/2022 Setting: Ambulatory Location: Ambulatory outpatient facility Delivery: Face-to-face Provider: Gaspar Cola, MD Specialty: Interventional Pain Management Specialty designation: 09 Location: Outpatient facility Ref. Prov.: Hortencia Pilar, MD    Primary Reason for Visit: Interventional Pain Management Treatment. CC: Back Pain (Lumbar bilateral )   Procedure:           Type: Lumbar Facet, Medial Branch Block(s) #1  Laterality: Bilateral  Level: L2, L3, L4, L5, & S1 Medial Branch Level(s). Injecting these levels blocks the L3-4 and L5-S1 lumbar facet joints. Imaging: Fluoroscopic guidance Anesthesia: Local anesthesia (1-2% Lidocaine) Anxiolysis: IV Versed 3.0 mg Sedation: Moderate conscious sedation.  (Fentanyl 1 cc) DOS: 05/22/2022 Performed by: Gaspar Cola, MD  Primary Purpose: Diagnostic/Therapeutic Indications: Low back pain severe enough to impact quality of life or function. 1. Lumbar facet joint syndrome   2. Spondylosis of lumbar region without myelopathy or radiculopathy   3. DDD (degenerative disc disease), lumbosacral   4. Decreased range of motion of lumbar spine   5. Failed back surgical syndrome (2015)   6. Chronic low back pain (1ry area of Pain) (Bilateral) (L>R) w/o sciatica   7. Abnormal MRI, lumbar spine (10/16/2019)    NAS-11 Pain score:   Pre-procedure: 6 /10   Post-procedure: 0-No pain/10     Position / Prep / Materials:  Position: Prone  Prep solution:  DuraPrep (Iodine Povacrylex [0.7% available iodine] and Isopropyl Alcohol, 74% w/w) Area Prepped: Posterolateral Lumbosacral Spine (Wide prep: From the lower border of the scapula down to the end of the tailbone and from flank to flank.)  Materials:  Tray: Block Needle(s):  Type: Spinal  Gauge (G): 22  Length: 5-in Qty: 4  Pre-op H&P Assessment:  Ms. Duhart is a 56 y.o. (year old), female patient, seen today for interventional treatment. She  has a past surgical history that includes Cesarean section; Tubal ligation; Endometrial ablation; Back surgery; Colonoscopy w/ biopsies and polypectomy; and Esophagogastroduodenoscopy (egd) with propofol (N/A, 12/12/2016). Ms. Miyoshi has a current medication list which includes the following prescription(s): allopurinol, calcium carbonate, cholecalciferol, colchicine, cyclobenzaprine, levothyroxine, losartan, metoprolol succinate, pantoprazole, and senna-docusate, and the following Facility-Administered Medications: fentanyl and lactated ringers. Her primarily concern today is the Back Pain (Lumbar bilateral )  Initial Vital Signs:  Pulse/HCG Rate: 64ECG Heart Rate: 65 Temp: 97.9 F (36.6 C) Resp: 16 BP: 140/77 SpO2: 100 %  BMI: Estimated body mass index is 32.28 kg/m as calculated from the following:   Height as of this encounter: '5\' 6"'$  (1.676 m).   Weight as of this encounter: 200 lb (90.7 kg).  Risk Assessment: Allergies: Reviewed. She is allergic to hydrocodone.  Allergy Precautions: None required Coagulopathies: Reviewed. None identified.  Blood-thinner therapy: None at this time Active Infection(s): Reviewed. None identified. Ms. Buckels is afebrile  Site Confirmation: Ms. Moya was asked to confirm the procedure and laterality before marking the site Procedure checklist: Completed Consent: Before the procedure and under the influence of no sedative(s), amnesic(s), or anxiolytics, the patient was informed of the treatment options, risks and  possible complications. To fulfill our ethical and legal obligations, as recommended by the  American Medical Association's Code of Ethics, I have informed the patient of my clinical impression; the nature and purpose of the treatment or procedure; the risks, benefits, and possible complications of the intervention; the alternatives, including doing nothing; the risk(s) and benefit(s) of the alternative treatment(s) or procedure(s); and the risk(s) and benefit(s) of doing nothing. The patient was provided information about the general risks and possible complications associated with the procedure. These may include, but are not limited to: failure to achieve desired goals, infection, bleeding, organ or nerve damage, allergic reactions, paralysis, and death. In addition, the patient was informed of those risks and complications associated to Spine-related procedures, such as failure to decrease pain; infection (i.e.: Meningitis, epidural or intraspinal abscess); bleeding (i.e.: epidural hematoma, subarachnoid hemorrhage, or any other type of intraspinal or peri-dural bleeding); organ or nerve damage (i.e.: Any type of peripheral nerve, nerve root, or spinal cord injury) with subsequent damage to sensory, motor, and/or autonomic systems, resulting in permanent pain, numbness, and/or weakness of one or several areas of the body; allergic reactions; (i.e.: anaphylactic reaction); and/or death. Furthermore, the patient was informed of those risks and complications associated with the medications. These include, but are not limited to: allergic reactions (i.e.: anaphylactic or anaphylactoid reaction(s)); adrenal axis suppression; blood sugar elevation that in diabetics may result in ketoacidosis or comma; water retention that in patients with history of congestive heart failure may result in shortness of breath, pulmonary edema, and decompensation with resultant heart failure; weight gain; swelling or edema;  medication-induced neural toxicity; particulate matter embolism and blood vessel occlusion with resultant organ, and/or nervous system infarction; and/or aseptic necrosis of one or more joints. Finally, the patient was informed that Medicine is not an exact science; therefore, there is also the possibility of unforeseen or unpredictable risks and/or possible complications that may result in a catastrophic outcome. The patient indicated having understood very clearly. We have given the patient no guarantees and we have made no promises. Enough time was given to the patient to ask questions, all of which were answered to the patient's satisfaction. Ms. Turbyfill has indicated that she wanted to continue with the procedure. Attestation: I, the ordering provider, attest that I have discussed with the patient the benefits, risks, side-effects, alternatives, likelihood of achieving goals, and potential problems during recovery for the procedure that I have provided informed consent. Date  Time: 05/22/2022  8:02 AM  Pre-Procedure Preparation:  Monitoring: As per clinic protocol. Respiration, ETCO2, SpO2, BP, heart rate and rhythm monitor placed and checked for adequate function Safety Precautions: Patient was assessed for positional comfort and pressure points before starting the procedure. Time-out: I initiated and conducted the "Time-out" before starting the procedure, as per protocol. The patient was asked to participate by confirming the accuracy of the "Time Out" information. Verification of the correct person, site, and procedure were performed and confirmed by me, the nursing staff, and the patient. "Time-out" conducted as per Joint Commission's Universal Protocol (UP.01.01.01). Time: 5732  Description of Procedure:          Laterality: Bilateral. The procedure was performed in identical fashion on both sides. Targeted Levels:  L2, L3, L4, L5, & S1 Medial Branch Level(s)  Safety Precautions: Aspiration  looking for blood return was conducted prior to all injections. At no point did we inject any substances, as a needle was being advanced. Before injecting, the patient was told to immediately notify me if she was experiencing any new onset of "ringing in the ears, or  metallic taste in the mouth". No attempts were made at seeking any paresthesias. Safe injection practices and needle disposal techniques used. Medications properly checked for expiration dates. SDV (single dose vial) medications used. After the completion of the procedure, all disposable equipment used was discarded in the proper designated medical waste containers. Local Anesthesia: Protocol guidelines were followed. The patient was positioned over the fluoroscopy table. The area was prepped in the usual manner. The time-out was completed. The target area was identified using fluoroscopy. A 12-in long, straight, sterile hemostat was used with fluoroscopic guidance to locate the targets for each level blocked. Once located, the skin was marked with an approved surgical skin marker. Once all sites were marked, the skin (epidermis, dermis, and hypodermis), as well as deeper tissues (fat, connective tissue and muscle) were infiltrated with a small amount of a short-acting local anesthetic, loaded on a 10cc syringe with a 25G, 1.5-in  Needle. An appropriate amount of time was allowed for local anesthetics to take effect before proceeding to the next step. Local Anesthetic: Lidocaine 2.0% The unused portion of the local anesthetic was discarded in the proper designated containers. Technical description of process:  L2 Medial Branch Nerve Block (MBB): The target area for the L2 medial branch is at the junction of the postero-lateral aspect of the superior articular process and the superior, posterior, and medial edge of the transverse process of L3. Under fluoroscopic guidance, a Quincke needle was inserted until contact was made with os over the superior  postero-lateral aspect of the pedicular shadow (target area). After negative aspiration for blood, 0.5 mL of the nerve block solution was injected without difficulty or complication. The needle was removed intact. L3 Medial Branch Nerve Block (MBB): The target area for the L3 medial branch is at the junction of the postero-lateral aspect of the superior articular process and the superior, posterior, and medial edge of the transverse process of L4. Under fluoroscopic guidance, a Quincke needle was inserted until contact was made with os over the superior postero-lateral aspect of the pedicular shadow (target area). After negative aspiration for blood, 0.5 mL of the nerve block solution was injected without difficulty or complication. The needle was removed intact. L4 Medial Branch Nerve Block (MBB): The target area for the L4 medial branch is at the junction of the postero-lateral aspect of the superior articular process and the superior, posterior, and medial edge of the transverse process of L5. Under fluoroscopic guidance, a Quincke needle was inserted until contact was made with os over the superior postero-lateral aspect of the pedicular shadow (target area). After negative aspiration for blood, 0.5 mL of the nerve block solution was injected without difficulty or complication. The needle was removed intact. L5 Medial Branch Nerve Block (MBB): The target area for the L5 medial branch is at the junction of the postero-lateral aspect of the superior articular process and the superior, posterior, and medial edge of the sacral ala. Under fluoroscopic guidance, a Quincke needle was inserted until contact was made with os over the superior postero-lateral aspect of the pedicular shadow (target area). After negative aspiration for blood, 0.5 mL of the nerve block solution was injected without difficulty or complication. The needle was removed intact. S1 Medial Branch Nerve Block (MBB): The target area for the S1  medial branch is at the posterior and inferior 6 o'clock position of the L5-S1 facet joint. Under fluoroscopic guidance, the Quincke needle inserted for the L5 MBB was redirected until contact was made with os  over the inferior and postero aspect of the sacrum, at the 6 o' clock position under the L5-S1 facet joint (Target area). After negative aspiration for blood, 0.5 mL of the nerve block solution was injected without difficulty or complication. The needle was removed intact.  Once the entire procedure was completed, the treated area was cleaned, making sure to leave some of the prepping solution back to take advantage of its long term bactericidal properties.      Illustration of the posterior view of the lumbar spine and the posterior neural structures. Laminae of L2 through S1 are labeled. DPRL5, dorsal primary ramus of L5; DPRS1, dorsal primary ramus of S1; DPR3, dorsal primary ramus of L3; FJ, facet (zygapophyseal) joint L3-L4; I, inferior articular process of L4; LB1, lateral branch of dorsal primary ramus of L1; IAB, inferior articular branches from L3 medial branch (supplies L4-L5 facet joint); IBP, intermediate branch plexus; MB3, medial branch of dorsal primary ramus of L3; NR3, third lumbar nerve root; S, superior articular process of L5; SAB, superior articular branches from L4 (supplies L4-5 facet joint also); TP3, transverse process of L3.  Vitals:   05/22/22 0908 05/22/22 0918 05/22/22 0928 05/22/22 0938  BP: 124/76 126/74 125/77 (!) 111/94  Pulse:      Resp: '18 18 15 13  '$ Temp:      TempSrc:      SpO2: 94% 99% 100% 100%  Weight:      Height:         Start Time: 0856 hrs. End Time: 0907 hrs.  Imaging Guidance (Spinal):          Type of Imaging Technique: Fluoroscopy Guidance (Spinal) Indication(s): Assistance in needle guidance and placement for procedures requiring needle placement in or near specific anatomical locations not easily accessible without such  assistance. Exposure Time: Please see nurses notes. Contrast: None used. Fluoroscopic Guidance: I was personally present during the use of fluoroscopy. "Tunnel Vision Technique" used to obtain the best possible view of the target area. Parallax error corrected before commencing the procedure. "Direction-depth-direction" technique used to introduce the needle under continuous pulsed fluoroscopy. Once target was reached, antero-posterior, oblique, and lateral fluoroscopic projection used confirm needle placement in all planes. Images permanently stored in EMR. Interpretation: No contrast injected. I personally interpreted the imaging intraoperatively. Adequate needle placement confirmed in multiple planes. Permanent images saved into the patient's record.  Antibiotic Prophylaxis:   Anti-infectives (From admission, onward)    None      Indication(s): None identified  Post-operative Assessment:  Post-procedure Vital Signs:  Pulse/HCG Rate: 6463 Temp: 97.9 F (36.6 C) Resp: 13 BP: (!) 111/94 SpO2: 100 %  EBL: None  Complications: No immediate post-treatment complications observed by team, or reported by patient.  Note: The patient tolerated the entire procedure well. A repeat set of vitals were taken after the procedure and the patient was kept under observation following institutional policy, for this type of procedure. Post-procedural neurological assessment was performed, showing return to baseline, prior to discharge. The patient was provided with post-procedure discharge instructions, including a section on how to identify potential problems. Should any problems arise concerning this procedure, the patient was given instructions to immediately contact us, at any time, without hesitation. In any case, we plan to contact the patient by telephone for a follow-up status report regarding this interventional procedure.  Comments:  No additional relevant information.  Plan of Care  Orders:   Orders Placed This Encounter  Procedures   LUMBAR FACET(MEDIAL BRANCH NERVE  BLOCK) MBNB    Scheduling Instructions:     Procedure: Lumbar facet block (AKA.: Lumbosacral medial branch nerve block)     Side: Bilateral     Level: L3-4 & L5-S1 Facets (L2, L3, L4, L5, & S1 Medial Branch Nerves)     Sedation: Patient's choice.     Timeframe: Today    Order Specific Question:   Where will this procedure be performed?    Answer:   [facility]   DG PA[facility] C-ARM 1-60 MIN NO REPORT    Intraoperative interpretation by procedural physician at [place].    Standing Status:   Standing    Number of Occurrences:   1    Order Specific Question:   Reason for exam:    Answer:   Assistance in needle guidance and placement for procedures requiring needle placement in or near specific anatomical locations not easily accessible without such assistance.   Informed Consent Details: Physician/Practitioner Attestation; Transcribe to consent form and obtain patient signature    Nursing Order: Transcribe to consent form and obtain patient signature. Note: Always confirm laterality of pain with Ms. Frater, before procedure.    Order Specific Question:   Physician/Practitioner attestation of informed consent for procedure/surgical case    Answer:   I, the physician/practitioner, attest that I have discussed with the patient the benefits, risks, side effects, alternatives, likelihood of achieving goals and potential problems during recovery for the procedure that I have provided informed consent.    Order Specific Question:   Procedure    Answer:   Lumbar Facet Block  under fluoroscopic guidance    Order Specific Question:   Physician/Practitioner performing the procedure    Answer:   Salley Boxley A. Dossie Arbour MD    Order Specific Question:   Indication/Reason    Answer:   Low Back Pain, with our without leg pain, due to Facet Joint Arthralgia (Joint Pain) Spondylosis (Arthritis of the Spine),  without myelopathy or radiculopathy (Nerve Damage).   Provide equipment / supplies at bedside    "Block Tray" (Disposable  single use) Needle type: SpinalSpinal Amount/quantity: 4 Size: Medium (5-inch) Gauge: 22G    Standing Status:   Standing    Number of Occurrences:   1    Order Specific Question:   Specify    Answer:   Block Tray   Chronic Opioid Analgesic:  None MME/day: 0 mg/day   Medications ordered for procedure: Meds ordered this encounter  Medications   lidocaine (XYLOCAINE) 2 % (with pres) injection 400 mg   lactated ringers infusion   midazolam (VERSED) 5 MG/5ML injection 0.5-2 mg    Make sure Flumazenil is available in the pyxis when using this medication. If oversedation occurs, administer 0.2 mg IV over 15 sec. If after 45 sec no response, administer 0.2 mg again over 1 min; may repeat at 1 min intervals; not to exceed 4 doses (1 mg)   fentaNYL (SUBLIMAZE) injection 25-50 mcg    Make sure Narcan is available in the pyxis when using this medication. In the event of respiratory depression (RR< 8/min): Titrate NARCAN (naloxone) in increments of 0.1 to 0.2 mg IV at 2-3 minute intervals, until desired degree of reversal.   ropivacaine (PF) 2 mg/mL (0.2%) (NAROPIN) injection 18 mL   triamcinolone acetonide (KENALOG-40) injection 80 mg   Medications administered: We administered lidocaine, lactated ringers, midazolam, fentaNYL, ropivacaine (PF) 2 mg/mL (0.2%), and triamcinolone acetonide.  See the medical record for exact dosing, route, and time  of administration.  Follow-up plan:   Return in about 2 weeks (around 06/05/2022) for Proc-day (T,Th), (F2F), (PPE).       Interventional Therapies  Risk  Complexity Considerations:   Estimated body mass index is 32.28 kg/m as calculated from the following:   Height as of this encounter: '5\' 6"'$  (1.676 m).   Weight as of this encounter: 200 lb (90.7 kg). WNL   Planned  Pending:   Diagnostic bilateral lumbar facet MBB #1     Under consideration:   Diagnostic midline caudal ESI + epidurogram #1  Diagnostic bilateral lumbar facet MBB #1  Diagnostic bilateral sacroiliac joint Blk #1  Diagnostic bilateral IA hip inj. #1  Diagnostic left cervical ESI #1  Diagnostic bilateral cervical facet MBB #1    Completed:   None at this time   Completed by other providers:   Right sacroiliac joint fusion (canceled on 05/28/2016)  Diagnostic/therapeutic bilateral L4-5 IA facet inj. (12/12/2019) (by Ashley Jacobs, MD) ([facility])  Diagnostic/therapeutic left L4-5 and L5-S1 TFESI (05/22/2021) (by Ashley Jacobs, MD) ([facility])    Therapeutic  Palliative (PRN) options:   None established     Recent Visits Date Type Provider Dept  04/14/22 Office Visit Milinda Pointer, MD [facility]  03/17/22 Office Visit Milinda Pointer, MD [facility]  Showing recent visits within past 90 days and meeting all other requirements Today's Visits Date Type Provider Dept  05/22/22 Procedure visit Milinda Pointer, MD [facility]  Showing today's visits and meeting all other requirements Future Appointments Date Type Provider Dept  06/03/22 Appointment Milinda Pointer, MD [facility]  Showing future appointments within next 90 days and meeting all other requirements  Disposition: Discharge home  Discharge (Date  Time): 05/22/2022; 0945 hrs.   Primary Care Physician: Hortencia Pilar, MD Location: [facility] Note by: Gaspar Cola, MD Date: 05/22/2022; Time: 9:48 AM  Disclaimer:  Medicine is not an Chief Strategy Officer. The only guarantee in medicine is that nothing is guaranteed. It is important to note that the decision to proceed with this intervention was based on the information collected from the patient. The Data and conclusions were drawn from the patient's questionnaire, the interview, and the physical examination. Because the  information was provided in large part by the patient, it cannot be guaranteed that it has not been purposely or unconsciously manipulated. Every effort has been made to obtain as much relevant data as possible for this evaluation. It is important to note that the conclusions that lead to this procedure are derived in large part from the available data. Always take into account that the treatment will also be dependent on availability of resources and existing treatment guidelines, considered by other Pain Management Practitioners as being common knowledge and practice, at the time of the intervention. For Medico-Legal purposes, it is also important to point out that variation in procedural techniques and pharmacological choices are the acceptable norm. The indications, contraindications, technique, and results of the above procedure should only be interpreted and judged by a Board-Certified Interventional Pain Specialist with extensive familiarity and expertise in the same exact procedure and technique.

## 2022-05-22 NOTE — Progress Notes (Signed)
Safety precautions to be maintained throughout the outpatient stay will include: orient to surroundings, keep bed in low position, maintain call bell within reach at all times, provide assistance with transfer out of bed and ambulation.  

## 2022-05-22 NOTE — Patient Instructions (Addendum)
____________________________________________________________________________________________  Post-Procedure Discharge Instructions  Instructions: Apply ice:  Purpose: This will minimize any swelling and discomfort after procedure.  When: Day of procedure, as soon as you get home. How: Fill a plastic sandwich bag with crushed ice. Cover it with a small towel and apply to injection site. How long: (15 min on, 15 min off) Apply for 15 minutes then remove x 15 minutes.  Repeat sequence on day of procedure, until you go to bed. Apply heat:  Purpose: To treat any soreness and discomfort from the procedure. When: Starting the next day after the procedure. How: Apply heat to procedure site starting the day following the procedure. How long: May continue to repeat daily, until discomfort goes away. Food intake: Start with clear liquids (like water) and advance to regular food, as tolerated.  Physical activities: Keep activities to a minimum for the first 8 hours after the procedure. After that, then as tolerated. Driving: If you have received any sedation, be responsible and do not drive. You are not allowed to drive for 24 hours after having sedation. Blood thinner: (Applies only to those taking blood thinners) You may restart your blood thinner 6 hours after your procedure. Insulin: (Applies only to Diabetic patients taking insulin) As soon as you can eat, you may resume your normal dosing schedule. Infection prevention: Keep procedure site clean and dry. Shower daily and clean area with soap and water. Post-procedure Pain Diary: Extremely important that this be done correctly and accurately. Recorded information will be used to determine the next step in treatment. For the purpose of accuracy, follow these rules: Evaluate only the area treated. Do not report or include pain from an untreated area. For the purpose of this evaluation, ignore all other areas of pain, except for the treated area. After  your procedure, avoid taking a long nap and attempting to complete the pain diary after you wake up. Instead, set your alarm clock to go off every hour, on the hour, for the initial 8 hours after the procedure. Document the duration of the numbing medicine, and the relief you are getting from it. Do not go to sleep and attempt to complete it later. It will not be accurate. If you received sedation, it is likely that you were given a medication that may cause amnesia. Because of this, completing the diary at a later time may cause the information to be inaccurate. This information is needed to plan your care. Follow-up appointment: Keep your post-procedure follow-up evaluation appointment after the procedure (usually 2 weeks for most procedures, 6 weeks for radiofrequencies). DO NOT FORGET to bring you pain diary with you.   Expect: (What should I expect to see with my procedure?) From numbing medicine (AKA: Local Anesthetics): Numbness or decrease in pain. You may also experience some weakness, which if present, could last for the duration of the local anesthetic. Onset: Full effect within 15 minutes of injected. Duration: It will depend on the type of local anesthetic used. On the average, 1 to 8 hours.  From steroids (Applies only if steroids were used): Decrease in swelling or inflammation. Once inflammation is improved, relief of the pain will follow. Onset of benefits: Depends on the amount of swelling present. The more swelling, the longer it will take for the benefits to be seen. In some cases, up to 10 days. Duration: Steroids will stay in the system x 2 weeks. Duration of benefits will depend on multiple posibilities including persistent irritating factors. Side-effects: If present, they  may typically last 2 weeks (the duration of the steroids). Frequent: Cramps (if they occur, drink Gatorade and take over-the-counter Magnesium 450-500 mg once to twice a day); water retention with temporary  weight gain; increases in blood sugar; decreased immune system response; increased appetite. Occasional: Facial flushing (red, warm cheeks); mood swings; menstrual changes. Uncommon: Long-term decrease or suppression of natural hormones; bone thinning. (These are more common with higher doses or more frequent use. This is why we prefer that our patients avoid having any injection therapies in other practices.)  Very Rare: Severe mood changes; psychosis; aseptic necrosis. From procedure: Some discomfort is to be expected once the numbing medicine wears off. This should be minimal if ice and heat are applied as instructed.  Call if: (When should I call?) You experience numbness and weakness that gets worse with time, as opposed to wearing off. New onset bowel or bladder incontinence. (Applies only to procedures done in the spine)  Emergency Numbers: Durning business hours (Monday - Thursday, 8:00 AM - 4:00 PM) (Friday, 9:00 AM - 12:00 Noon): [phone] After hours: [phone] NOTE: If you are having a problem and are unable connect with, or to talk to a provider, then go to your nearest urgent care or emergency department. If the problem is serious and urgent, please call 911. ____________________________________________________________________________________________  ____________________________________________________________________________________________  Muscle Spasms & Cramps  Cause:  The most common cause of muscle spasms and cramps is vitamin and/or electrolyte (calcium, potassium, sodium, etc.) deficiencies.  Possible triggers: Sweating - causes loss of electrolytes thru the skin. Steroids - causes loss of electrolytes thru the urine.  Treatment: Gatorade (or any other electrolyte-replenishing drink) - Take 1, 8 oz glass with each meal (3 times a day). OTC (over-the-counter) Magnesium 400 to 500 mg - Take 1 tablet twice a day (one with breakfast and one before bedtime).  If you have kidney problems, talk to your primary care physician before taking any Magnesium. Tonic Water with quinine - Take 1, 8 oz glass before bedtime.   ____________________________________________________________________________________________

## 2022-05-23 ENCOUNTER — Telehealth: Payer: Self-pay | Admitting: *Deleted

## 2022-05-23 NOTE — Telephone Encounter (Signed)
Post procedure call;  patient reports she is doing very well.  Able to do lots of activity around the house.

## 2022-06-01 NOTE — Progress Notes (Unsigned)
PROVIDER NOTE: Information contained herein reflects review and annotations entered in association with encounter. Interpretation of such information and data should be left to medically-trained personnel. Information provided to patient can be located elsewhere in the medical record under "Patient Instructions". Document created using STT-dictation technology, any transcriptional errors that may result from process are unintentional.    Patient: Donna Cannon  Service Category: E/M  Provider: Gaspar Cola, MD  DOB: 07/02/1966  DOS: 06/03/2022  Specialty: Interventional Pain Management  MRN: [id number]  Setting: Ambulatory outpatient  PCP: Hortencia Pilar, MD  Type: Established Patient    Referring Provider: Hortencia Pilar, MD  Location: Office  Delivery: Face-to-face     HPI  [name], a 56 y.o. year old female, is here today because of her No primary diagnosis found.. Ms. Hasten primary complain today is No chief complaint on file. Last encounter: My last encounter with her was on 05/22/2022. Pertinent problems: Ms. 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; Radiculopathy of cervical region; Spinal stenosis of cervical region; Osteopenia of multiple sites; Spondylosis of lumbar region without myelopathy or radiculopathy; Thoracic spine pain; Chronic pain syndrome; Abnormal MRI, cervical spine (11/18/2021); Abnormal MRI, lumbar spine (10/16/2019); Lumbosacral facet arthropathy (Multilevel) (Bilateral); 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; Lumbar foraminal stenosis (L4-5) (Bilateral); DDD (degenerative disc disease), lumbosacral; Cervical facet hypertrophy; DDD (degenerative disc disease), cervical; Chronic feet pain (5th area of Pain)  (Bilateral); Chronic wrist pain (7th area of Pain) (Right); Chronic low back pain (1ry area of Pain) (Bilateral) (L>R) w/o sciatica; Failed back surgical syndrome (2015); Chronic lower extremity pain (2ry area of Pain) (Bilateral) (L>R); Chronic lower extremity radicular pain (S1 dermatome) (Bilateral); Chronic upper extremity pain (3ry area of Pain) (Left); Chronic neck pain (4th area of Pain) (Posterior) (Bilateral) (L>R); Chronic ankle pain (6th area of Pain) (Bilateral); Grade 1 Retrolisthesis of cervical spine (C3/C4 and C5/C6); Decreased range of motion of lumbar spine; Painful cervical range of motion; Impaired range of motion of cervical spine; Lumbar facet joint syndrome; Chronic hip pain (Bilateral); and Chronic sacroiliac joint pain (Bilateral) on their pertinent problem list. Pain Assessment: Severity of   is reported as a  /10. Location:    / . Onset:  . Quality:  . Timing:  . Modifying factor(s):  Marland Kitchen Vitals:  vitals were not taken for this visit.   Reason for encounter:  *** . ***  Pharmacotherapy Assessment  Analgesic: None MME/day: 0 mg/day   Monitoring: [place] PMP: PDMP reviewed during this encounter.       Pharmacotherapy: No side-effects or adverse reactions reported. Compliance: No problems identified. Effectiveness: Clinically acceptable.  No notes on file  UDS:  Summary  Date Value Ref Range Status  03/17/2022 Note  Final    Comment:    ==================================================================== Compliance Drug Analysis, Ur ==================================================================== Test                             Result       Flag       Units  Drug Present and Declared for Prescription Verification   Cyclobenzaprine                PRESENT      EXPECTED   Desmethylcyclobenzaprine  PRESENT      EXPECTED    Desmethylcyclobenzaprine is an expected metabolite of    cyclobenzaprine.  Drug Present not Declared for Prescription Verification    Diphenhydramine                PRESENT      UNEXPECTED   Metoprolol                     PRESENT      UNEXPECTED ==================================================================== Test                      Result    Flag   Units      Ref Range   Creatinine              68               mg/dL      >=20 ==================================================================== Declared Medications:  The flagging and interpretation on this report are based on the  following declared medications.  Unexpected results may arise from  inaccuracies in the declared medications.   **Note: The testing scope of this panel includes these medications:   Cyclobenzaprine (Flexeril)   **Note: The testing scope of this panel does not include the  following reported medications:   Allopurinol (Zyloprim)  Calcium  Colchicine  Cyanocobalamin  Levothyroxine (Synthroid)  Losartan (Cozaar)  Pantoprazole (Protonix)  Sennosides (Senokot)  Vitamin D3 ==================================================================== For clinical consultation, please call [phone]. ====================================================================      ROS  Constitutional: Denies any fever or chills Gastrointestinal: No reported hemesis, hematochezia, vomiting, or acute GI distress Musculoskeletal: Denies any acute onset joint swelling, redness, loss of ROM, or weakness Neurological: No reported episodes of acute onset apraxia, aphasia, dysarthria, agnosia, amnesia, paralysis, loss of coordination, or loss of consciousness  Medication Review  allopurinol, calcium carbonate, cholecalciferol, colchicine, cyclobenzaprine, levothyroxine, losartan, metoprolol succinate, pantoprazole, and senna-docusate  History Review  Allergy: Ms. Legore is allergic to hydrocodone. Drug: Ms. Keel  reports no history of drug use. Alcohol:  reports current alcohol use. Tobacco:  reports that she has never smoked. She has never used  smokeless tobacco. Social: Ms. Philippi  reports that she has never smoked. She has never used smokeless tobacco. She reports current alcohol use. She reports that she does not use drugs. Medical:  has a past medical history of Anxiety, Arthritis, Cancer ([place]), GERD (gastroesophageal reflux disease), Hypertension, Hypothyroidism, Mitral valve prolapse, PONV (postoperative nausea and vomiting), Sacroiliac dysfunction, Spinal headache, and Wears glasses. Surgical: Ms. Flamm  has a past surgical history that includes Cesarean section; Tubal ligation; Endometrial ablation; Back surgery; Colonoscopy w/ biopsies and polypectomy; and Esophagogastroduodenoscopy (egd) with propofol (N/A, 12/12/2016). Family: family history includes Breast cancer in her mother; Colon cancer in her brother; Hypertension in her mother and sister.  Laboratory Chemistry Profile   Renal Lab Results  Component Value Date   BUN 14 03/17/2022   CREATININE 1.10 (H) 03/17/2022   GFRAA >60 05/20/2016   GFRNONAA 59 (L) 03/17/2022    Hepatic Lab Results  Component Value Date   AST 69 (H) 03/17/2022   ALT 104 (H) 03/17/2022   ALBUMIN 4.7 03/17/2022   ALKPHOS 88 03/17/2022    Electrolytes Lab Results  Component Value Date   NA 140 03/17/2022   K 4.3 03/17/2022   CL 105 03/17/2022   CALCIUM 10.0 03/17/2022   MG 2.0 03/17/2022    Bone Lab Results  Component Value Date  25OHVITD1 67 03/17/2022   25OHVITD2 61 03/17/2022   25OHVITD3 5.7 03/17/2022    Inflammation (CRP: Acute Phase) (ESR: Chronic Phase) Lab Results  Component Value Date   CRP 0.8 03/17/2022   ESRSEDRATE 7 03/17/2022         Note: Above Lab results reviewed.  Recent Imaging Review  DG PAIN CLINIC C-ARM 1-60 MIN NO REPORT Fluoro was used, but no Radiologist interpretation will be provided.  Please refer to "NOTES" tab for provider progress note. Note: Reviewed        Physical Exam  General appearance: Well nourished, well developed, and well  hydrated. In no apparent acute distress Mental status: Alert, oriented x 3 (person, place, & time)       Respiratory: No evidence of acute respiratory distress Eyes: PERLA Vitals: There were no vitals taken for this visit. BMI: Estimated body mass index is 32.28 kg/m as calculated from the following:   Height as of 05/22/22: _0  (1.676 m).   Weight as of 05/22/22: 200 lb (90.7 kg). Ideal: Ideal body weight: 59.3 kg (130 lb 11.7 oz) Adjusted ideal body weight: 71.9 kg (158 lb 7 oz)  Assessment   Diagnosis Status  No diagnosis found. Controlled Controlled Controlled   Updated Problems: No problems updated.  Plan of Care  Problem-specific:  No problem-specific Assessment & Plan notes found for this encounter.  Ms. MYLIN GIGNAC has a current medication list which includes the following long-term medication(s): allopurinol, calcium carbonate, colchicine, levothyroxine, losartan, and pantoprazole.  Pharmacotherapy (Medications Ordered): No orders of the defined types were placed in this encounter.  Orders:  No orders of the defined types were placed in this encounter.  Follow-up plan:   No follow-ups on file.     Interventional Therapies  Risk  Complexity Considerations:   Estimated body mass index is 32.28 kg/m as calculated from the following:   Height as of this encounter: _1  (1.676 m).   Weight as of this encounter: 200 lb (90.7 kg). WNL   Planned  Pending:   Diagnostic bilateral lumbar facet MBB #1    Under consideration:   Diagnostic midline caudal ESI + epidurogram #1  Diagnostic bilateral lumbar facet MBB #1  Diagnostic bilateral sacroiliac joint Blk #1  Diagnostic bilateral IA hip inj. #1  Diagnostic left cervical ESI #1  Diagnostic bilateral cervical facet MBB #1    Completed:   None at this time   Completed by other providers:   Right sacroiliac joint fusion (canceled on 05/28/2016)  Diagnostic/therapeutic bilateral L4-5 IA facet inj. (12/12/2019)  (by Ashley Jacobs, MD) ([facility])  Diagnostic/therapeutic left L4-5 and L5-S1 TFESI (05/22/2021) (by Ashley Jacobs, MD) ([facility])    Therapeutic  Palliative (PRN) options:   None established      Recent Visits Date Type Provider Dept  05/22/22 Procedure visit Milinda Pointer, MD [facility]  04/14/22 Office Visit Milinda Pointer, MD [facility]  03/17/22 Office Visit Milinda Pointer, MD [facility]  Showing recent visits within past 90 days and meeting all other requirements Future Appointments Date Type Provider Dept  06/03/22 Appointment Milinda Pointer, MD [facility]  Showing future appointments within next 90 days and meeting all other requirements  I discussed the assessment and treatment plan with the patient. The patient was provided an opportunity to ask questions and all were answered. The patient agreed with the plan and demonstrated an understanding of the instructions.  Patient advised to call back  or seek an in-person evaluation if the symptoms or condition worsens.  Duration of encounter: *** minutes.  Note by: Gaspar Cola, MD Date: 06/03/2022; Time: 11:17 AM

## 2022-06-03 ENCOUNTER — Encounter: Payer: Self-pay | Admitting: Pain Medicine

## 2022-06-03 ENCOUNTER — Ambulatory Visit: Payer: BC Managed Care – PPO | Attending: Pain Medicine | Admitting: Pain Medicine

## 2022-06-03 VITALS — BP 129/77 | HR 66 | Temp 97.2°F | Ht 66.0 in | Wt 200.0 lb

## 2022-06-03 DIAGNOSIS — M79604 Pain in right leg: Secondary | ICD-10-CM | POA: Insufficient documentation

## 2022-06-03 DIAGNOSIS — M47816 Spondylosis without myelopathy or radiculopathy, lumbar region: Secondary | ICD-10-CM | POA: Insufficient documentation

## 2022-06-03 DIAGNOSIS — M79605 Pain in left leg: Secondary | ICD-10-CM | POA: Insufficient documentation

## 2022-06-03 DIAGNOSIS — M5137 Other intervertebral disc degeneration, lumbosacral region: Secondary | ICD-10-CM | POA: Diagnosis present

## 2022-06-03 DIAGNOSIS — G8929 Other chronic pain: Secondary | ICD-10-CM | POA: Insufficient documentation

## 2022-06-03 DIAGNOSIS — M5386 Other specified dorsopathies, lumbar region: Secondary | ICD-10-CM | POA: Insufficient documentation

## 2022-06-03 DIAGNOSIS — M545 Low back pain, unspecified: Secondary | ICD-10-CM | POA: Diagnosis not present

## 2022-06-03 DIAGNOSIS — M961 Postlaminectomy syndrome, not elsewhere classified: Secondary | ICD-10-CM | POA: Insufficient documentation

## 2022-06-03 NOTE — Progress Notes (Signed)
Safety precautions to be maintained throughout the outpatient stay will include: orient to surroundings, keep bed in low position, maintain call bell within reach at all times, provide assistance with transfer out of bed and ambulation.  

## 2022-06-03 NOTE — Patient Instructions (Signed)
____________________________________________________________________________________________  Muscle Spasms & Cramps  Cause:  The most common cause of muscle spasms and cramps is vitamin and/or electrolyte (calcium, potassium, sodium, etc.) deficiencies.  Possible triggers: Sweating - causes loss of electrolytes thru the skin. Steroids - causes loss of electrolytes thru the urine.  Treatment: Gatorade (or any other electrolyte-replenishing drink) - Take 1, 8 oz glass with each meal (3 times a day). OTC (over-the-counter) Magnesium 400 to 500 mg - Take 1 tablet twice a day (one with breakfast and one before bedtime). If you have kidney problems, talk to your primary care physician before taking any Magnesium. Tonic Water with quinine - Take 1, 8 oz glass before bedtime.   ____________________________________________________________________________________________  ______________________________________________________________________  Preparing for Procedure with Sedation  NOTICE: Due to recent regulatory changes, starting on July 15, 2021, procedures requiring intravenous (IV) sedation will no longer be performed at the [facility].  These types of procedures are required to be performed at [facility] ambulatory surgery facility.  We are very sorry for the inconvenience.  Procedure appointments are limited to planned procedures: No Prescription Refills. No disability issues will be discussed. No medication changes will be discussed.  Instructions: Oral Intake: Do not eat or drink anything for at least 8 hours prior to your procedure. (Exception: Blood Pressure Medication. See below.) Transportation: A driver is required. You may not drive yourself after the procedure. Blood Pressure Medicine: Do not forget to take your blood pressure medicine with a sip of water the morning of the procedure. If your Diastolic (lower reading) is above 100 mmHg, elective cases will be  cancelled/rescheduled. Blood thinners: These will need to be stopped for procedures. Notify our staff if you are taking any blood thinners. Depending on which one you take, there will be specific instructions on how and when to stop it. Diabetics on insulin: Notify the staff so that you can be scheduled 1st case in the morning. If your diabetes requires high dose insulin, take only  of your normal insulin dose the morning of the procedure and notify the staff that you have done so. Preventing infections: Shower with an antibacterial soap the morning of your procedure. Build-up your immune system: Take 1000 mg of Vitamin C with every meal (3 times a day) the day prior to your procedure. Antibiotics: Inform the staff if you have a condition or reason that requires you to take antibiotics before dental procedures. Pregnancy: If you are pregnant, call and cancel the procedure. Sickness: If you have a cold, fever, or any active infections, call and cancel the procedure. Arrival: You must be in the facility at least 30 minutes prior to your scheduled procedure. Children: Do not bring children with you. Dress appropriately: There is always the possibility that your clothing may get soiled. Valuables: Do not bring any jewelry or valuables.  Reasons to call and reschedule or cancel your procedure: (Following these recommendations will minimize the risk of a serious complication.) Surgeries: Avoid having procedures within 2 weeks of any surgery. (Avoid for 2 weeks before or after any surgery). Flu Shots: Avoid having procedures within 2 weeks of a flu shots. (Avoid for 2 weeks before or after immunizations). Barium: Avoid having a procedure within 7-10 days after having had a radiological study involving the use of radiological contrast. (Myelograms, Barium swallow or enema study). Heart attacks: Avoid any elective procedures or surgeries for the initial 6 months after a "Myocardial Infarction" (Heart  Attack). Blood thinners: It is imperative that you stop these  medications before procedures. Let us know if you if you take any blood thinner.  Infection: Avoid procedures during or within two weeks of an infection (including chest colds or gastrointestinal problems). Symptoms associated with infections include: Localized redness, fever, chills, night sweats or profuse sweating, burning sensation when voiding, cough, congestion, stuffiness, runny nose, sore throat, diarrhea, nausea, vomiting, cold or Flu symptoms, recent or current infections. It is specially important if the infection is over the area that we intend to treat. Heart and lung problems: Symptoms that may suggest an active cardiopulmonary problem include: cough, chest pain, breathing difficulties or shortness of breath, dizziness, ankle swelling, uncontrolled high or unusually low blood pressure, and/or palpitations. If you are experiencing any of these symptoms, cancel your procedure and contact your primary care physician for an evaluation.  Remember:  Regular Business hours are:  Monday to Thursday 8:00 AM to 4:00 PM  Provider's Schedule: Milinda Pointer, MD:  Procedure days: Tuesday and Thursday 7:30 AM to 4:00 PM  Gillis Santa, MD:  Procedure days: Monday and Wednesday 7:30 AM to 4:00 PM ______________________________________________________________________  ____________________________________________________________________________________________  General Risks and Possible Complications  Patient Responsibilities: It is important that you read this as it is part of your informed consent. It is our duty to inform you of the risks and possible complications associated with treatments offered to you. It is your responsibility as a patient to read this and to ask questions about anything that is not clear or that you believe was not covered in this document.  Patient's Rights: You have the right to refuse treatment. You also  have the right to change your mind, even after initially having agreed to have the treatment done. However, under this last option, if you wait until the last second to change your mind, you may be charged for the materials used up to that point.  Introduction: Medicine is not an Chief Strategy Officer. Everything in Medicine, including the lack of treatment(s), carries the potential for danger, harm, or loss (which is by definition: Risk). In Medicine, a complication is a secondary problem, condition, or disease that can aggravate an already existing one. All treatments carry the risk of possible complications. The fact that a side effects or complications occurs, does not imply that the treatment was conducted incorrectly. It must be clearly understood that these can happen even when everything is done following the highest safety standards.  No treatment: You can choose not to proceed with the proposed treatment alternative. The "PRO(s)" would include: avoiding the risk of complications associated with the therapy. The "CON(s)" would include: not getting any of the treatment benefits. These benefits fall under one of three categories: diagnostic; therapeutic; and/or palliative. Diagnostic benefits include: getting information which can ultimately lead to improvement of the disease or symptom(s). Therapeutic benefits are those associated with the successful treatment of the disease. Finally, palliative benefits are those related to the decrease of the primary symptoms, without necessarily curing the condition (example: decreasing the pain from a flare-up of a chronic condition, such as incurable terminal cancer).  General Risks and Complications: These are associated to most interventional treatments. They can occur alone, or in combination. They fall under one of the following six (6) categories: no benefit or worsening of symptoms; bleeding; infection; nerve damage; allergic reactions; and/or death. No benefits or  worsening of symptoms: In Medicine there are no guarantees, only probabilities. No healthcare provider can ever guarantee that a medical treatment will work, they can only state the probability  that it may. Furthermore, there is always the possibility that the condition may worsen, either directly, or indirectly, as a consequence of the treatment. Bleeding: This is more common if the patient is taking a blood thinner, either prescription or over the counter (example: Goody Powders, Fish oil, Aspirin, Garlic, etc.), or if suffering a condition associated with impaired coagulation (example: Hemophilia, cirrhosis of the liver, low platelet counts, etc.). However, even if you do not have one on these, it can still happen. If you have any of these conditions, or take one of these drugs, make sure to notify your treating physician. Infection: This is more common in patients with a compromised immune system, either due to disease (example: diabetes, cancer, human immunodeficiency virus [HIV], etc.), or due to medications or treatments (example: therapies used to treat cancer and rheumatological diseases). However, even if you do not have one on these, it can still happen. If you have any of these conditions, or take one of these drugs, make sure to notify your treating physician. Nerve Damage: This is more common when the treatment is an invasive one, but it can also happen with the use of medications, such as those used in the treatment of cancer. The damage can occur to small secondary nerves, or to large primary ones, such as those in the spinal cord and brain. This damage may be temporary or permanent and it may lead to impairments that can range from temporary numbness to permanent paralysis and/or brain death. Allergic Reactions: Any time a substance or material comes in contact with our body, there is the possibility of an allergic reaction. These can range from a mild skin rash (contact dermatitis) to a severe  systemic reaction (anaphylactic reaction), which can result in death. Death: In general, any medical intervention can result in death, most of the time due to an unforeseen complication. ____________________________________________________________________________________________

## 2022-06-18 NOTE — Progress Notes (Unsigned)
PROVIDER NOTE: Interpretation of information contained herein should be left to medically-trained personnel. Specific patient instructions are provided elsewhere under "Patient Instructions" section of medical record. This document was created in part using STT-dictation technology, any transcriptional errors that may result from this process are unintentional.  Patient: Donna Cannon Type: Established DOB: 12-19-1965 MRN: [id number] PCP: Hortencia Pilar, MD  Service: Procedure DOS: 06/19/2022 Setting: Ambulatory Location: Ambulatory outpatient facility Delivery: Face-to-face Provider: Gaspar Cola, MD Specialty: Interventional Pain Management Specialty designation: 09 Location: Outpatient facility Ref. Prov.: Hortencia Pilar, MD    Primary Reason for Visit: Interventional Pain Management Treatment. CC: No chief complaint on file.   Procedure:           Type: Lumbar Facet, Medial Branch Block(s) #2  Laterality: Bilateral  Level: L2, L3, L4, L5, & S1 Medial Branch Level(s). Injecting these levels blocks the L3-4, L4-5 and L5-S1 lumbar facet joints. Imaging: Fluoroscopic guidance Anesthesia: Local anesthesia (1-2% Lidocaine) Anxiolysis: IV Versed         Sedation: Moderate conscious sedation. DOS: 06/19/2022 Performed by: Gaspar Cola, MD  Primary Purpose: Diagnostic/Therapeutic Indications: Low back pain severe enough to impact quality of life or function. 1. Lumbar facet syndrome   2. Spondylosis of lumbar region without myelopathy or radiculopathy   3. Lumbosacral facet arthropathy (Multilevel) (Bilateral)   4. DDD (degenerative disc disease), lumbosacral   5. Chronic low back pain (1ry area of Pain) (Bilateral) (L>R) w/o sciatica    NAS-11 Pain score:   Pre-procedure:  /10   Post-procedure:  /10     Position / Prep / Materials:  Position: Prone  Prep solution: DuraPrep (Iodine Povacrylex [0.7% available iodine] and Isopropyl Alcohol, 74% w/w) Area Prepped:  Posterolateral Lumbosacral Spine (Wide prep: From the lower border of the scapula down to the end of the tailbone and from flank to flank.)  Materials:  Tray: Block Needle(s):  Type: Spinal  Gauge (G): 22  Length: 5-in Qty: 4  Pre-op H&P Assessment:  Donna Cannon is a 56 y.o. (year old), female patient, seen today for interventional treatment. She  has a past surgical history that includes Cesarean section; Tubal ligation; Endometrial ablation; Back surgery; Colonoscopy w/ biopsies and polypectomy; and Esophagogastroduodenoscopy (egd) with propofol (N/A, 12/12/2016). Donna Cannon has a current medication list which includes the following prescription(s): allopurinol, calcium carbonate, cholecalciferol, colchicine, cyclobenzaprine, levothyroxine, losartan, metoprolol succinate, pantoprazole, and senna-docusate. Her primarily concern today is the No chief complaint on file.  Initial Vital Signs:  Pulse/HCG Rate:    Temp:   Resp:   BP:   SpO2:    BMI: Estimated body mass index is 32.28 kg/m as calculated from the following:   Height as of 06/03/22: '5\' 6"'$  (1.676 m).   Weight as of 06/03/22: 200 lb (90.7 kg).  Risk Assessment: Allergies: Reviewed. She is allergic to hydrocodone.  Allergy Precautions: None required Coagulopathies: Reviewed. None identified.  Blood-thinner therapy: None at this time Active Infection(s): Reviewed. None identified. Donna Cannon is afebrile  Site Confirmation: Donna Cannon was asked to confirm the procedure and laterality before marking the site Procedure checklist: Completed Consent: Before the procedure and under the influence of no sedative(s), amnesic(s), or anxiolytics, the patient was informed of the treatment options, risks and possible complications. To fulfill our ethical and legal obligations, as recommended by the American Medical Association's Code of Ethics, I have informed the patient of my clinical impression; the nature and purpose of the treatment or  procedure; the risks, benefits, and  possible complications of the intervention; the alternatives, including doing nothing; the risk(s) and benefit(s) of the alternative treatment(s) or procedure(s); and the risk(s) and benefit(s) of doing nothing. The patient was provided information about the general risks and possible complications associated with the procedure. These may include, but are not limited to: failure to achieve desired goals, infection, bleeding, organ or nerve damage, allergic reactions, paralysis, and death. In addition, the patient was informed of those risks and complications associated to Spine-related procedures, such as failure to decrease pain; infection (i.e.: Meningitis, epidural or intraspinal abscess); bleeding (i.e.: epidural hematoma, subarachnoid hemorrhage, or any other type of intraspinal or peri-dural bleeding); organ or nerve damage (i.e.: Any type of peripheral nerve, nerve root, or spinal cord injury) with subsequent damage to sensory, motor, and/or autonomic systems, resulting in permanent pain, numbness, and/or weakness of one or several areas of the body; allergic reactions; (i.e.: anaphylactic reaction); and/or death. Furthermore, the patient was informed of those risks and complications associated with the medications. These include, but are not limited to: allergic reactions (i.e.: anaphylactic or anaphylactoid reaction(s)); adrenal axis suppression; blood sugar elevation that in diabetics may result in ketoacidosis or comma; water retention that in patients with history of congestive heart failure may result in shortness of breath, pulmonary edema, and decompensation with resultant heart failure; weight gain; swelling or edema; medication-induced neural toxicity; particulate matter embolism and blood vessel occlusion with resultant organ, and/or nervous system infarction; and/or aseptic necrosis of one or more joints. Finally, the patient was informed that Medicine is  not an exact science; therefore, there is also the possibility of unforeseen or unpredictable risks and/or possible complications that may result in a catastrophic outcome. The patient indicated having understood very clearly. We have given the patient no guarantees and we have made no promises. Enough time was given to the patient to ask questions, all of which were answered to the patient's satisfaction. Donna Cannon has indicated that she wanted to continue with the procedure. Attestation: I, the ordering provider, attest that I have discussed with the patient the benefits, risks, side-effects, alternatives, likelihood of achieving goals, and potential problems during recovery for the procedure that I have provided informed consent. Date  Time: {CHL [facility]-PAIN TIME CHOICES:[id number]}  Pre-Procedure Preparation:  Monitoring: As per clinic protocol. Respiration, ETCO2, SpO2, BP, heart rate and rhythm monitor placed and checked for adequate function Safety Precautions: Patient was assessed for positional comfort and pressure points before starting the procedure. Time-out: I initiated and conducted the "Time-out" before starting the procedure, as per protocol. The patient was asked to participate by confirming the accuracy of the "Time Out" information. Verification of the correct person, site, and procedure were performed and confirmed by me, the nursing staff, and the patient. "Time-out" conducted as per Joint Commission's Universal Protocol (UP.01.01.01). Time:    Description of Procedure:          Laterality: Bilateral. The procedure was performed in identical fashion on both sides. Targeted Levels:  L2, L3, L4, L5, & S1 Medial Branch Level(s)  Safety Precautions: Aspiration looking for blood return was conducted prior to all injections. At no point did we inject any substances, as a needle was being advanced. Before injecting, the patient was told to immediately notify me if she was experiencing any  new onset of "ringing in the ears, or metallic taste in the mouth". No attempts were made at seeking any paresthesias. Safe injection practices and needle disposal techniques used. Medications properly checked for expiration dates.  SDV (single dose vial) medications used. After the completion of the procedure, all disposable equipment used was discarded in the proper designated medical waste containers. Local Anesthesia: Protocol guidelines were followed. The patient was positioned over the fluoroscopy table. The area was prepped in the usual manner. The time-out was completed. The target area was identified using fluoroscopy. A 12-in long, straight, sterile hemostat was used with fluoroscopic guidance to locate the targets for each level blocked. Once located, the skin was marked with an approved surgical skin marker. Once all sites were marked, the skin (epidermis, dermis, and hypodermis), as well as deeper tissues (fat, connective tissue and muscle) were infiltrated with a small amount of a short-acting local anesthetic, loaded on a 10cc syringe with a 25G, 1.5-in  Needle. An appropriate amount of time was allowed for local anesthetics to take effect before proceeding to the next step. Local Anesthetic: Lidocaine 2.0% The unused portion of the local anesthetic was discarded in the proper designated containers. Technical description of process:  L2 Medial Branch Nerve Block (MBB): The target area for the L2 medial branch is at the junction of the postero-lateral aspect of the superior articular process and the superior, posterior, and medial edge of the transverse process of L3. Under fluoroscopic guidance, a Quincke needle was inserted until contact was made with os over the superior postero-lateral aspect of the pedicular shadow (target area). After negative aspiration for blood, 0.5 mL of the nerve block solution was injected without difficulty or complication. The needle was removed intact. L3 Medial  Branch Nerve Block (MBB): The target area for the L3 medial branch is at the junction of the postero-lateral aspect of the superior articular process and the superior, posterior, and medial edge of the transverse process of L4. Under fluoroscopic guidance, a Quincke needle was inserted until contact was made with os over the superior postero-lateral aspect of the pedicular shadow (target area). After negative aspiration for blood, 0.5 mL of the nerve block solution was injected without difficulty or complication. The needle was removed intact. L4 Medial Branch Nerve Block (MBB): The target area for the L4 medial branch is at the junction of the postero-lateral aspect of the superior articular process and the superior, posterior, and medial edge of the transverse process of L5. Under fluoroscopic guidance, a Quincke needle was inserted until contact was made with os over the superior postero-lateral aspect of the pedicular shadow (target area). After negative aspiration for blood, 0.5 mL of the nerve block solution was injected without difficulty or complication. The needle was removed intact. L5 Medial Branch Nerve Block (MBB): The target area for the L5 medial branch is at the junction of the postero-lateral aspect of the superior articular process and the superior, posterior, and medial edge of the sacral ala. Under fluoroscopic guidance, a Quincke needle was inserted until contact was made with os over the superior postero-lateral aspect of the pedicular shadow (target area). After negative aspiration for blood, 0.5 mL of the nerve block solution was injected without difficulty or complication. The needle was removed intact. S1 Medial Branch Nerve Block (MBB): The target area for the S1 medial branch is at the posterior and inferior 6 o'clock position of the L5-S1 facet joint. Under fluoroscopic guidance, the Quincke needle inserted for the L5 MBB was redirected until contact was made with os over the inferior  and postero aspect of the sacrum, at the 6 o' clock position under the L5-S1 facet joint (Target area). After negative aspiration for blood,  0.5 mL of the nerve block solution was injected without difficulty or complication. The needle was removed intact.  Once the entire procedure was completed, the treated area was cleaned, making sure to leave some of the prepping solution back to take advantage of its long term bactericidal properties.      Illustration of the posterior view of the lumbar spine and the posterior neural structures. Laminae of L2 through S1 are labeled. DPRL5, dorsal primary ramus of L5; DPRS1, dorsal primary ramus of S1; DPR3, dorsal primary ramus of L3; FJ, facet (zygapophyseal) joint L3-L4; I, inferior articular process of L4; LB1, lateral branch of dorsal primary ramus of L1; IAB, inferior articular branches from L3 medial branch (supplies L4-L5 facet joint); IBP, intermediate branch plexus; MB3, medial branch of dorsal primary ramus of L3; NR3, third lumbar nerve root; S, superior articular process of L5; SAB, superior articular branches from L4 (supplies L4-5 facet joint also); TP3, transverse process of L3.  There were no vitals filed for this visit.   Start Time:   hrs. End Time:   hrs.  Imaging Guidance (Spinal):          Type of Imaging Technique: Fluoroscopy Guidance (Spinal) Indication(s): Assistance in needle guidance and placement for procedures requiring needle placement in or near specific anatomical locations not easily accessible without such assistance. Exposure Time: Please see nurses notes. Contrast: None used. Fluoroscopic Guidance: I was personally present during the use of fluoroscopy. "Tunnel Vision Technique" used to obtain the best possible view of the target area. Parallax error corrected before commencing the procedure. "Direction-depth-direction" technique used to introduce the needle under continuous pulsed fluoroscopy. Once target was reached,  antero-posterior, oblique, and lateral fluoroscopic projection used confirm needle placement in all planes. Images permanently stored in EMR. Interpretation: No contrast injected. I personally interpreted the imaging intraoperatively. Adequate needle placement confirmed in multiple planes. Permanent images saved into the patient's record.  Antibiotic Prophylaxis:   Anti-infectives (From admission, onward)    None      Indication(s): None identified  Post-operative Assessment:  Post-procedure Vital Signs:  Pulse/HCG Rate:    Temp:   Resp:   BP:   SpO2:    EBL: None  Complications: No immediate post-treatment complications observed by team, or reported by patient.  Note: The patient tolerated the entire procedure well. A repeat set of vitals were taken after the procedure and the patient was kept under observation following institutional policy, for this type of procedure. Post-procedural neurological assessment was performed, showing return to baseline, prior to discharge. The patient was provided with post-procedure discharge instructions, including a section on how to identify potential problems. Should any problems arise concerning this procedure, the patient was given instructions to immediately contact us, at any time, without hesitation. In any case, we plan to contact the patient by telephone for a follow-up status report regarding this interventional procedure.  Comments:  No additional relevant information.  Plan of Care  Orders:  No orders of the defined types were placed in this encounter.  Chronic Opioid Analgesic:  None MME/day: 0 mg/day   Medications ordered for procedure: No orders of the defined types were placed in this encounter.  Medications administered: Raiford Simmonds had no medications administered during this visit.  See the medical record for exact dosing, route, and time of administration.  Follow-up plan:   No follow-ups on file.        Interventional Therapies  Risk  Complexity Considerations:   Estimated body mass  index is 32.28 kg/m as calculated from the following:   Height as of this encounter: '5\' 6"'$  (1.676 m).   Weight as of this encounter: 200 lb (90.7 kg). WNL   Planned  Pending:   Diagnostic bilateral lumbar facet MBB #2    Under consideration:   Diagnostic midline caudal ESI + epidurogram #1  Diagnostic bilateral lumbar facet MBB #1  Diagnostic bilateral sacroiliac joint Blk #1  Diagnostic bilateral IA hip inj. #1  Diagnostic left cervical ESI #1  Diagnostic bilateral cervical facet MBB #1    Completed:   Diagnostic bilateral lumbar facet MBB x1 (05/22/2022) (100/100/100 x 3 days/0) (LBP & LEP improved)   Completed by other providers:   Right sacroiliac joint fusion (canceled on 05/28/2016)  Diagnostic/therapeutic bilateral L4-5 IA facet inj. (12/12/2019) (by Ashley Jacobs, MD) ([facility])  Diagnostic/therapeutic left L4-5 and L5-S1 TFESI (05/22/2021) (by Ashley Jacobs, MD) ([facility])    Therapeutic  Palliative (PRN) options:   None established     Recent Visits Date Type Provider Dept  06/03/22 Office Visit Milinda Pointer, MD [facility]  05/22/22 Procedure visit Milinda Pointer, MD [facility]  04/14/22 Office Visit Milinda Pointer, MD [facility]  Showing recent visits within past 90 days and meeting all other requirements Future Appointments Date Type Provider Dept  06/19/22 Appointment Milinda Pointer, MD [facility]  Showing future appointments within next 90 days and meeting all other requirements  Disposition: Discharge home  Discharge (Date  Time): 06/19/2022;   hrs.   Primary Care Physician: Hortencia Pilar, MD Location: [facility] Note by: Gaspar Cola, MD Date: 06/19/2022; Time: 3:59 PM  Disclaimer:  Medicine is not an Chief Strategy Officer. The only guarantee in medicine is that  nothing is guaranteed. It is important to note that the decision to proceed with this intervention was based on the information collected from the patient. The Data and conclusions were drawn from the patient's questionnaire, the interview, and the physical examination. Because the information was provided in large part by the patient, it cannot be guaranteed that it has not been purposely or unconsciously manipulated. Every effort has been made to obtain as much relevant data as possible for this evaluation. It is important to note that the conclusions that lead to this procedure are derived in large part from the available data. Always take into account that the treatment will also be dependent on availability of resources and existing treatment guidelines, considered by other Pain Management Practitioners as being common knowledge and practice, at the time of the intervention. For Medico-Legal purposes, it is also important to point out that variation in procedural techniques and pharmacological choices are the acceptable norm. The indications, contraindications, technique, and results of the above procedure should only be interpreted and judged by a Board-Certified Interventional Pain Specialist with extensive familiarity and expertise in the same exact procedure and technique.

## 2022-06-19 ENCOUNTER — Ambulatory Visit
Admission: RE | Admit: 2022-06-19 | Discharge: 2022-06-19 | Disposition: A | Discharge status: Home / Self Care | Payer: BC Managed Care – PPO | Source: Ambulatory Visit | Attending: Pain Medicine | Admitting: Pain Medicine

## 2022-06-19 ENCOUNTER — Ambulatory Visit: Payer: BC Managed Care – PPO | Attending: Pain Medicine | Admitting: Pain Medicine

## 2022-06-19 ENCOUNTER — Encounter: Payer: Self-pay | Admitting: Pain Medicine

## 2022-06-19 VITALS — BP 130/65 | HR 62 | Temp 97.2°F | Resp 16 | Ht 66.0 in | Wt 200.0 lb

## 2022-06-19 DIAGNOSIS — G8929 Other chronic pain: Secondary | ICD-10-CM | POA: Diagnosis present

## 2022-06-19 DIAGNOSIS — M47816 Spondylosis without myelopathy or radiculopathy, lumbar region: Secondary | ICD-10-CM

## 2022-06-19 DIAGNOSIS — M5137 Other intervertebral disc degeneration, lumbosacral region: Secondary | ICD-10-CM | POA: Insufficient documentation

## 2022-06-19 DIAGNOSIS — M545 Low back pain, unspecified: Secondary | ICD-10-CM | POA: Insufficient documentation

## 2022-06-19 DIAGNOSIS — M47817 Spondylosis without myelopathy or radiculopathy, lumbosacral region: Secondary | ICD-10-CM | POA: Insufficient documentation

## 2022-06-19 MED ORDER — LIDOCAINE HCL 2 % IJ SOLN
20.0000 mL | Freq: Once | INTRAMUSCULAR | Status: AC
  Administered 2022-06-19: 400 mg

## 2022-06-19 MED ORDER — FENTANYL CITRATE (PF) 100 MCG/2ML IJ SOLN
INTRAMUSCULAR | Status: AC
  Filled 2022-06-19: qty 2

## 2022-06-19 MED ORDER — FENTANYL CITRATE (PF) 100 MCG/2ML IJ SOLN: 25.0000 ug | INTRAMUSCULAR | Status: DC | PRN

## 2022-06-19 MED ORDER — LACTATED RINGERS IV SOLN: Freq: Once | INTRAVENOUS | Status: AC

## 2022-06-19 MED ORDER — TRIAMCINOLONE ACETONIDE 40 MG/ML IJ SUSP
INTRAMUSCULAR | Status: AC
  Filled 2022-06-19: qty 2

## 2022-06-19 MED ORDER — PENTAFLUOROPROP-TETRAFLUOROETH EX AERO: INHALATION_SPRAY | Freq: Once | CUTANEOUS | Status: DC

## 2022-06-19 MED ORDER — TRIAMCINOLONE ACETONIDE 40 MG/ML IJ SUSP
80.0000 mg | Freq: Once | INTRAMUSCULAR | Status: AC
  Administered 2022-06-19: 80 mg

## 2022-06-19 MED ORDER — MIDAZOLAM HCL 5 MG/5ML IJ SOLN
0.5000 mg | Freq: Once | INTRAMUSCULAR | Status: AC
  Administered 2022-06-19: 2 mg via INTRAVENOUS

## 2022-06-19 MED ORDER — LIDOCAINE HCL 2 % IJ SOLN
INTRAMUSCULAR | Status: AC
  Filled 2022-06-19: qty 20

## 2022-06-19 MED ORDER — MIDAZOLAM HCL 5 MG/5ML IJ SOLN
INTRAMUSCULAR | Status: AC
  Filled 2022-06-19: qty 5

## 2022-06-19 MED ORDER — ROPIVACAINE HCL 2 MG/ML IJ SOLN
18.0000 mL | Freq: Once | INTRAMUSCULAR | Status: AC
  Administered 2022-06-19: 18 mL via PERINEURAL

## 2022-06-19 MED ORDER — ROPIVACAINE HCL 2 MG/ML IJ SOLN
INTRAMUSCULAR | Status: AC
  Filled 2022-06-19: qty 20

## 2022-06-19 NOTE — Progress Notes (Signed)
Safety precautions to be maintained throughout the outpatient stay will include: orient to surroundings, keep bed in low position, maintain call bell within reach at all times, provide assistance with transfer out of bed and ambulation.  

## 2022-06-19 NOTE — Patient Instructions (Signed)
____________________________________________________________________________________________  Virtual Visits   What is a "Virtual Visit"? It is a healthcare communication encounter (medical visit) that takes place on real time (NOT TEXT or E-MAIL) over the telephone or computer device (desktop, laptop, tablet, smart phone, etc.). It allows for more location flexibility between the patient and the healthcare provider.  Who decides when these types of visits will be used? The physician.  Who is eligible for these types of visits? Only those patients that can be reliably reached over the telephone.  What do you mean by reliably? We do not have time to call everyone multiple times, therefore those that tend to screen calls and then call back later are not suitable candidates for this system. We understand how people are reluctant to pickup on "unknown" calls, therefore, we suggest adding our telephone numbers to your list of "CONTACT(s)". This way, you should be able to readily identify our calls when you receive one. All of our numbers are available below.   Who is not eligible? This option is not available for medication management encounters, specially for controlled substances. Patients on pain medications that fall under the category of controlled substances have to come in for "Face-to-Face" encounters. This is required for mandatory monitoring of these substances. You may be asked to provide a sample for an unannounced urine drug screening test (UDS), and we will need to count your pain pills. Not bringing your pills to be counted may result in no refill. Obviously, neither one of these can be done over the phone.  When will this type of visits be used? You can request a virtual visit whenever you are physically unable to attend a regular appointment. The decision will be made by the physician (or healthcare provider) on a case by case basis.   At what time will I be called? This is an  excellent question. The providers will try to call you whenever they have time available. Do not expect to be called at any specific time. The secretaries will assign you a time for your virtual visit appointment, but this is done simply to keep a list of those patients that need to be called, but not for the purpose of keeping a time schedule. Be advised that the call may come in anytime during the day, between the hours of 8:00 AM and 8::00 PM, depending on provider availability. We do understand that the system is not perfect. If you are unable to be available that day on a moments notice, then request an "in-person" appointment rather than a "virtual visit".  Can I request my medication visits to be "Virtual"? Yes you may request it, but the decision is entirely up to the healthcare provider. Control substances require specific monitoring that requires Face-to-Face encounters. The number of encounters  and the extent of the monitoring is determined on a case by case basis.  Add a new contact to your smart phone and label it "PAIN CLINIC" Under this contact add the following numbers: Main: [phone] (Official Contact Number) Nurses: [phone] (These are outgoing only calling systems. Do not call this number.) Dr. Yosselin Zoeller: [phone] or [phone] (Outgoing calls only. Do not call this number.)  ____________________________________________________________________________________________   ____________________________________________________________________________________________  Post-Procedure Discharge Instructions  Instructions: Apply ice:  Purpose: This will minimize any swelling and discomfort after procedure.  When: Day of procedure, as soon as you get home. How: Fill a plastic sandwich bag with crushed ice. Cover it with a small towel and apply to   injection site. How long: (15 min on, 15 min off) Apply for 15 minutes then remove x 15 minutes.  Repeat sequence on day  of procedure, until you go to bed. Apply heat:  Purpose: To treat any soreness and discomfort from the procedure. When: Starting the next day after the procedure. How: Apply heat to procedure site starting the day following the procedure. How long: May continue to repeat daily, until discomfort goes away. Food intake: Start with clear liquids (like water) and advance to regular food, as tolerated.  Physical activities: Keep activities to a minimum for the first 8 hours after the procedure. After that, then as tolerated. Driving: If you have received any sedation, be responsible and do not drive. You are not allowed to drive for 24 hours after having sedation. Blood thinner: (Applies only to those taking blood thinners) You may restart your blood thinner 6 hours after your procedure. Insulin: (Applies only to Diabetic patients taking insulin) As soon as you can eat, you may resume your normal dosing schedule. Infection prevention: Keep procedure site clean and dry. Shower daily and clean area with soap and water. Post-procedure Pain Diary: Extremely important that this be done correctly and accurately. Recorded information will be used to determine the next step in treatment. For the purpose of accuracy, follow these rules: Evaluate only the area treated. Do not report or include pain from an untreated area. For the purpose of this evaluation, ignore all other areas of pain, except for the treated area. After your procedure, avoid taking a long nap and attempting to complete the pain diary after you wake up. Instead, set your alarm clock to go off every hour, on the hour, for the initial 8 hours after the procedure. Document the duration of the numbing medicine, and the relief you are getting from it. Do not go to sleep and attempt to complete it later. It will not be accurate. If you received sedation, it is likely that you were given a medication that may cause amnesia. Because of this, completing  the diary at a later time may cause the information to be inaccurate. This information is needed to plan your care. Follow-up appointment: Keep your post-procedure follow-up evaluation appointment after the procedure (usually 2 weeks for most procedures, 6 weeks for radiofrequencies). DO NOT FORGET to bring you pain diary with you.   Expect: (What should I expect to see with my procedure?) From numbing medicine (AKA: Local Anesthetics): Numbness or decrease in pain. You may also experience some weakness, which if present, could last for the duration of the local anesthetic. Onset: Full effect within 15 minutes of injected. Duration: It will depend on the type of local anesthetic used. On the average, 1 to 8 hours.  From steroids (Applies only if steroids were used): Decrease in swelling or inflammation. Once inflammation is improved, relief of the pain will follow. Onset of benefits: Depends on the amount of swelling present. The more swelling, the longer it will take for the benefits to be seen. In some cases, up to 10 days. Duration: Steroids will stay in the system x 2 weeks. Duration of benefits will depend on multiple posibilities including persistent irritating factors. Side-effects: If present, they may typically last 2 weeks (the duration of the steroids). Frequent: Cramps (if they occur, drink Gatorade and take over-the-counter Magnesium 450-500 mg once to twice a day); water retention with temporary weight gain; increases in blood sugar; decreased immune system response; increased appetite. Occasional: Facial flushing (red,   warm cheeks); mood swings; menstrual changes. Uncommon: Long-term decrease or suppression of natural hormones; bone thinning. (These are more common with higher doses or more frequent use. This is why we prefer that our patients avoid having any injection therapies in other practices.)  Very Rare: Severe mood changes; psychosis; aseptic necrosis. From procedure: Some  discomfort is to be expected once the numbing medicine wears off. This should be minimal if ice and heat are applied as instructed.  Call if: (When should I call?) You experience numbness and weakness that gets worse with time, as opposed to wearing off. New onset bowel or bladder incontinence. (Applies only to procedures done in the spine)  Emergency Numbers: Durning business hours (Monday - Thursday, 8:00 AM - 4:00 PM) (Friday, 9:00 AM - 12:00 Noon): [phone] After hours: [phone] NOTE: If you are having a problem and are unable connect with, or to talk to a provider, then go to your nearest urgent care or emergency department. If the problem is serious and urgent, please call 911. ____________________________________________________________________________________________   

## 2022-06-20 ENCOUNTER — Telehealth: Payer: Self-pay | Admitting: *Deleted

## 2022-06-20 NOTE — Telephone Encounter (Signed)
Post procedure call;  some pain on yesterday, forgot to ice her back when she got home.  Tylenol and ice have her feeling much better this morning.

## 2022-06-29 NOTE — Progress Notes (Signed)
Patient: Donna Cannon  Service Category: E/M  Provider: Gaspar Cola, MD  DOB: [birth year]  DOS: 07/03/2022  Location: Office  MRN: [id number]  Setting: Ambulatory outpatient  Referring Provider: Hortencia Pilar, MD  Type: Established Patient  Specialty: Interventional Pain Management  PCP: Hortencia Pilar, MD  Location: Remote location  Delivery: TeleHealth     Virtual Encounter - Pain Management PROVIDER NOTE: Information contained herein reflects review and annotations entered in association with encounter. Interpretation of such information and data should be left to medically-trained personnel. Information provided to patient can be located elsewhere in the medical record under "Patient Instructions". Document created using STT-dictation technology, any transcriptional errors that may result from process are unintentional.    Contact & Pharmacy Preferred: [phone] Home: [phone] (home) Mobile: [phone] (mobile) E-mail: [email or web address]  [place], [place] - [place] [address] Phone: [phone] Fax: [phone]   Pre-screening  Donna Cannon offered "in-person" vs "virtual" encounter. She indicated preferring virtual for this encounter.   Reason COVID-19*  Social distancing based on CDC and AMA recommendations.   I contacted Donna Cannon on 07/03/2022 via telephone.      I clearly identified myself as Gaspar Cola, MD. I verified that I was speaking with the correct person using two identifiers (Name: Donna Cannon, and date of birth: 16-May-1966).  Consent I sought verbal advanced consent from Donna Cannon for virtual visit interactions. I informed Donna Cannon of possible security and privacy concerns, risks, and limitations associated with providing "not-in-person" medical evaluation and management services. I also informed Donna Cannon of the availability of "in-person" appointments. Finally, I informed her that there would be  a charge for the virtual visit and that she could be  personally, fully or partially, financially responsible for it. Donna Cannon expressed understanding and agreed to proceed.   Historic Elements   Donna Cannon is a 56 y.o. year old, female patient evaluated today after our last contact on 06/19/2022. Donna Cannon  has a past medical history of Anxiety, Arthritis, Cancer ([facility]), GERD (gastroesophageal reflux disease), Hypertension, Hypothyroidism, Mitral valve prolapse, PONV (postoperative nausea and vomiting), Sacroiliac dysfunction, Spinal headache, and Wears glasses. She also  has a past surgical history that includes Cesarean section; Tubal ligation; Endometrial ablation; Back surgery; Colonoscopy w/ biopsies and polypectomy; and Esophagogastroduodenoscopy (egd) with propofol (N/A, 12/12/2016). Donna Cannon has a current medication list which includes the following prescription(s): allopurinol, calcium carbonate, cholecalciferol, colchicine, cyclobenzaprine, losartan, metoprolol succinate, pantoprazole, senna-docusate, and levothyroxine. She  reports that she has never smoked. She has never used smokeless tobacco. She reports current alcohol use. She reports that she does not use drugs. Donna Cannon is allergic to hydrocodone.   HPI  Today, she is being contacted for a post-procedure assessment.  The patient refers having attained a 100% relief of the pain for approximately 3 to 4 days after the procedure.  Unfortunately some of the pain is beginning to come back and although the right side remains much better than the left, the left side is slowly going back to baseline.  During the time that the local anesthetic was in place and she was not having any pain, she did notice an increase in her function and range of motion.  Unfortunately, some of this is decreasing again due to the pain coming back.  At this point, I believe that she would be an excellent candidate for radiofrequency ablation.  The plan  was shared  with the patient who indicated also being in agreement and very excited about the next step.  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  Duration: This pain has persisted for longer than three months.  Non-surgical care: The patient has either failed to respond, or was unable to tolerate, or simply did not get enough benefit from other more conservative therapies including, but not limited to: 1. Over-the-counter oral analgesic medications (i.e.: ibuprofen, naproxen, etc.) 2. Anti-inflammatory medications 3. Muscle relaxants 4. Membrane stabilizers 5. Opioids 6. Physical therapy (PT), chiropractic manipulation, and/or home exercise program (HEP). She indicates having had physical therapy for approximately 6 weeks at [facility] physical therapy in [place] around 2021.  This physical therapy did not help her low back pain.   7. Modalities (Heat, ice, etc.)  Invasive therapies: Nerve blocks and/or surgery have failed to provide any significant long-term benefit.  Surgical care: Not indicated.  Physical exam: Has been consistent with Lumbosacral Facet Syndrome.  Diagnostic imaging: Lumbosacral Facet Arthropathy.                Diagnostic interventional therapies: Donna Cannon has attained greater than 50% reduction in pain from at least two (2) diagnostic medial branch blocks conducted in separate occasions.   For the above listed reason, I believe, as the examining and treating physician, that it is medically necessary to proceed with Non-Pulsed Radiofrequency Ablation for the purpose of attempting to prolong the duration of the benefits seen with the diagnostic injections.  At this point, after having personally evaluated this patient and being the treating attending physician, it is my professional opinion, as a  board-certified pain management specialist, that it is medically necessary for this patient to undergo the treatments that I am ordering.  Any attempt at denying these treatments, by a health care insurance entity, represents a case of "practicing medicine without a license", which is currently illegal in the state of [place].  Furthermore, if this company attempts to use a Mudlogger" to deny such treatment without having performed a face-to-face evaluation, represents an act of gross negligence and malpractice.   Post-procedure evaluation   Type: Lumbar Facet, Medial Branch Block(s) #2  Laterality: Bilateral  Level: L2, L3, L4, L5, & S1 Medial Branch Level(s). Injecting these levels blocks the L3-4, L4-5 and L5-S1 lumbar facet joints. Imaging: Fluoroscopic guidance Anesthesia: Local anesthesia (1-2% Lidocaine) Anxiolysis: IV Versed         Sedation: Moderate conscious sedation. DOS: 06/19/2022 Performed by: Gaspar Cola, MD  Primary Purpose: Diagnostic/Therapeutic Indications: Low back pain severe enough to impact quality of life or function. 1. Lumbar facet syndrome   2. Spondylosis of lumbar region without myelopathy or radiculopathy   3. Lumbosacral facet arthropathy (Multilevel) (Bilateral)   4. DDD (degenerative disc disease), lumbosacral   5. Chronic low back pain (1ry area of Pain) (Bilateral) (L>R) w/o sciatica    NAS-11 Pain score:   Pre-procedure: 6 /10   Post-procedure: 0-No pain/10     Effectiveness:  Initial hour after procedure: 100 %. Subsequent 4-6 hours post-procedure: 100 %. Analgesia past initial 6 hours: 100 % (lasting 3-4 days). Ongoing improvement:  Analgesic: The patient indicates that her right side is doing much better than the left, but the pain is already coming back.  She is very much interested in moving onto the radiofrequency ablation. Function: Somewhat improved ROM: Somewhat  improved  Pharmacotherapy Assessment   Opioid  Analgesic: None MME/day: 0 mg/day   Monitoring: [place] PMP: PDMP reviewed during this encounter.       Pharmacotherapy: No side-effects or adverse reactions reported. Compliance: No problems identified. Effectiveness: Clinically acceptable. Plan: Refer to "POC". UDS:  Summary  Date Value Ref Range Status  03/17/2022 Note  Final    Comment:    ==================================================================== Compliance Drug Analysis, Ur ==================================================================== Test                             Result       Flag       Units  Drug Present and Declared for Prescription Verification   Cyclobenzaprine                PRESENT      EXPECTED   Desmethylcyclobenzaprine       PRESENT      EXPECTED    Desmethylcyclobenzaprine is an expected metabolite of    cyclobenzaprine.  Drug Present not Declared for Prescription Verification   Diphenhydramine                PRESENT      UNEXPECTED   Metoprolol                     PRESENT      UNEXPECTED ==================================================================== Test                      Result    Flag   Units      Ref Range   Creatinine              68               mg/dL      >=20 ==================================================================== Declared Medications:  The flagging and interpretation on this report are based on the  following declared medications.  Unexpected results may arise from  inaccuracies in the declared medications.   **Note: The testing scope of this panel includes these medications:   Cyclobenzaprine (Flexeril)   **Note: The testing scope of this panel does not include the  following reported medications:   Allopurinol (Zyloprim)  Calcium  Colchicine  Cyanocobalamin  Levothyroxine (Synthroid)  Losartan (Cozaar)  Pantoprazole (Protonix)  Sennosides (Senokot)  Vitamin D3 ==================================================================== For clinical  consultation, please call [phone]. ====================================================================      Laboratory Chemistry Profile   Renal Lab Results  Component Value Date   BUN 14 03/17/2022   CREATININE 1.10 (H) 03/17/2022   GFRAA >60 05/20/2016   GFRNONAA 59 (L) 03/17/2022    Hepatic Lab Results  Component Value Date   AST 69 (H) 03/17/2022   ALT 104 (H) 03/17/2022   ALBUMIN 4.7 03/17/2022   ALKPHOS 88 03/17/2022    Electrolytes Lab Results  Component Value Date   NA 140 03/17/2022   K 4.3 03/17/2022   CL 105 03/17/2022   CALCIUM 10.0 03/17/2022   MG 2.0 03/17/2022    Bone Lab Results  Component Value Date   25OHVITD1 67 03/17/2022   25OHVITD2 61 03/17/2022   25OHVITD3 5.7 03/17/2022    Inflammation (CRP: Acute Phase) (ESR: Chronic Phase) Lab Results  Component Value Date   CRP 0.8 03/17/2022   ESRSEDRATE 7 03/17/2022         Note: Above Lab results reviewed.  Imaging  DG PAIN CLINIC C-ARM 1-60 MIN NO REPORT Fluoro  was used, but no Radiologist interpretation will be provided.  Please refer to "NOTES" tab for provider progress note.  Assessment  The primary encounter diagnosis was Chronic low back pain (1ry area of Pain) (Bilateral) (L>R) w/o sciatica. Diagnoses of Lumbar facet syndrome, Lumbosacral facet arthropathy (Multilevel) (Bilateral), Spondylosis of lumbar region without myelopathy or radiculopathy, DDD (degenerative disc disease), lumbosacral, Failed back surgical syndrome (2015), and Abnormal MRI, lumbar spine (10/16/2019) were also pertinent to this visit.  Plan of Care  Problem-specific:  No problem-specific Assessment & Plan notes found for this encounter.  Donna Cannon has a current medication list which includes the following long-term medication(s): allopurinol, calcium carbonate, colchicine, losartan, pantoprazole, and levothyroxine.  Pharmacotherapy (Medications Ordered): No orders of the defined types were placed in  this encounter.  Orders:  Orders Placed This Encounter  Procedures   Radiofrequency,Lumbar    Standing Status:   Future    Standing Expiration Date:   10/03/2022    Scheduling Instructions:     Side(s): Left-sided     Level: L3-4, L4-5, & L5-S1 Facets (L2, L3, L4, L5, & S1 Medial Branch Nerves)     Sedation: With Sedation.     Scheduling Timeframe: As soon as pre-approved    Order Specific Question:   Where will this procedure be performed?    Answer:   [facility]   Follow-up plan:   Return for (13mn), procedure (ECT): (L) L-FCT RFA #1.     Interventional Therapies  Risk  Complexity Considerations:   Estimated body mass index is 32.28 kg/m as calculated from the following:   Height as of this encounter: 5' 6"  (1.676 m).   Weight as of this encounter: 200 lb (90.7 kg). WNL   Planned  Pending:   Therapeutic bilateral lumbar facet RFA #1 (starting with the left side and following with the right 2-6 weeks later)   Under consideration:   Therapeutic bilateral lumbar facet RFA #1  Diagnostic midline caudal ESI + epidurogram #1  Diagnostic bilateral sacroiliac joint Blk #1  Diagnostic bilateral IA hip inj. #1  Diagnostic left cervical ESI #1  Diagnostic bilateral cervical facet MBB #1    Completed:   Diagnostic bilateral lumbar facet MBB x2 (06/19/2022) (100/100/100 x 3 days/0) (LBP & LEP improved)    Completed by other providers:   Right sacroiliac joint fusion (canceled on 05/28/2016)  Diagnostic/therapeutic bilateral L4-5 IA facet inj. (12/12/2019) (by GAshley Jacobs MD) ([facility])  Diagnostic/therapeutic left L4-5 and L5-S1 TFESI (05/22/2021) (by GAshley Jacobs MD) ([facility])    Therapeutic  Palliative (PRN) options:   None established    Recent Visits Date Type Provider Dept  06/19/22 Procedure visit NMilinda Pointer MD [facility]  06/03/22 Office Visit NMilinda Pointer MD [facility]  05/22/22 Procedure visit  NMilinda Pointer MD [facility]  04/14/22 Office Visit NMilinda Pointer MD [facility]  Showing recent visits within past 90 days and meeting all other requirements Today's Visits Date Type Provider Dept  07/03/22 Office Visit NMilinda Pointer MD [facility]  Showing today's visits and meeting all other requirements Future Appointments No visits were found meeting these conditions. Showing future appointments within next 90 days and meeting all other requirements  I discussed the assessment and treatment plan with the patient. The patient was provided an opportunity to ask questions and all were answered. The patient agreed with the plan and demonstrated an understanding of the instructions.  Patient advised to  call back or seek an in-person evaluation if the symptoms or condition worsens.  Duration of encounter: 15 minutes.  Note by: Gaspar Cola, MD Date: 07/03/2022; Time: 12:33 PM

## 2022-07-03 ENCOUNTER — Ambulatory Visit: Payer: BC Managed Care – PPO | Attending: Pain Medicine | Admitting: Pain Medicine

## 2022-07-03 DIAGNOSIS — M5137 Other intervertebral disc degeneration, lumbosacral region: Secondary | ICD-10-CM

## 2022-07-03 DIAGNOSIS — R937 Abnormal findings on diagnostic imaging of other parts of musculoskeletal system: Secondary | ICD-10-CM

## 2022-07-03 DIAGNOSIS — G8929 Other chronic pain: Secondary | ICD-10-CM

## 2022-07-03 DIAGNOSIS — M545 Low back pain, unspecified: Secondary | ICD-10-CM

## 2022-07-03 DIAGNOSIS — M47816 Spondylosis without myelopathy or radiculopathy, lumbar region: Secondary | ICD-10-CM | POA: Diagnosis not present

## 2022-07-03 DIAGNOSIS — M961 Postlaminectomy syndrome, not elsewhere classified: Secondary | ICD-10-CM

## 2022-07-03 DIAGNOSIS — M47817 Spondylosis without myelopathy or radiculopathy, lumbosacral region: Secondary | ICD-10-CM

## 2022-07-04 NOTE — Patient Instructions (Signed)
______________________________________________________________________  Preparing for Procedure with Sedation  NOTICE: Due to recent regulatory changes, starting on July 15, 2021, procedures requiring intravenous (IV) sedation will no longer be performed at the [facility].  These types of procedures are required to be performed at [facility] ambulatory surgery facility.  We are very sorry for the inconvenience.  Procedure appointments are limited to planned procedures: No Prescription Refills. No disability issues will be discussed. No medication changes will be discussed.  Instructions: Oral Intake: Do not eat or drink anything for at least 8 hours prior to your procedure. (Exception: Blood Pressure Medication. See below.) Transportation: A driver is required. You may not drive yourself after the procedure. Blood Pressure Medicine: Do not forget to take your blood pressure medicine with a sip of water the morning of the procedure. If your Diastolic (lower reading) is above 100 mmHg, elective cases will be cancelled/rescheduled. Blood thinners: These will need to be stopped for procedures. Notify our staff if you are taking any blood thinners. Depending on which one you take, there will be specific instructions on how and when to stop it. Diabetics on insulin: Notify the staff so that you can be scheduled 1st case in the morning. If your diabetes requires high dose insulin, take only  of your normal insulin dose the morning of the procedure and notify the staff that you have done so. Preventing infections: Shower with an antibacterial soap the morning of your procedure. Build-up your immune system: Take 1000 mg of Vitamin C with every meal (3 times a day) the day prior to your procedure. Antibiotics: Inform the staff if you have a condition or reason that requires you to take antibiotics before dental procedures. Pregnancy: If you are pregnant, call and cancel the procedure. Sickness: If  you have a cold, fever, or any active infections, call and cancel the procedure. Arrival: You must be in the facility at least 30 minutes prior to your scheduled procedure. Children: Do not bring children with you. Dress appropriately: There is always the possibility that your clothing may get soiled. Valuables: Do not bring any jewelry or valuables.  Reasons to call and reschedule or cancel your procedure: (Following these recommendations will minimize the risk of a serious complication.) Surgeries: Avoid having procedures within 2 weeks of any surgery. (Avoid for 2 weeks before or after any surgery). Flu Shots: Avoid having procedures within 2 weeks of a flu shots. (Avoid for 2 weeks before or after immunizations). Barium: Avoid having a procedure within 7-10 days after having had a radiological study involving the use of radiological contrast. (Myelograms, Barium swallow or enema study). Heart attacks: Avoid any elective procedures or surgeries for the initial 6 months after a "Myocardial Infarction" (Heart Attack). Blood thinners: It is imperative that you stop these medications before procedures. Let us know if you if you take any blood thinner.  Infection: Avoid procedures during or within two weeks of an infection (including chest colds or gastrointestinal problems). Symptoms associated with infections include: Localized redness, fever, chills, night sweats or profuse sweating, burning sensation when voiding, cough, congestion, stuffiness, runny nose, sore throat, diarrhea, nausea, vomiting, cold or Flu symptoms, recent or current infections. It is specially important if the infection is over the area that we intend to treat. Heart and lung problems: Symptoms that may suggest an active cardiopulmonary problem include: cough, chest pain, breathing difficulties or shortness of breath, dizziness, ankle swelling, uncontrolled high or unusually low blood pressure, and/or palpitations. If you are    experiencing any of these symptoms, cancel your procedure and contact your primary care physician for an evaluation.  Remember:  Regular Business hours are:  Monday to Thursday 8:00 AM to 4:00 PM  Provider's Schedule: Francisco Naveira, MD:  Procedure days: Tuesday and Thursday 7:30 AM to 4:00 PM  Bilal Lateef, MD:  Procedure days: Monday and Wednesday 7:30 AM to 4:00 PM ______________________________________________________________________   

## 2022-07-17 ENCOUNTER — Telehealth: Payer: Self-pay

## 2022-07-17 NOTE — Telephone Encounter (Signed)
Insurance Treatment Denial Note  Date order was entered:  Order entered by: Milinda Pointer, MD Requested treatment: RFA Reason for denial: Documentation of Physical Therapy is missing. Recommended for approval:  They state it is investigational. And there no recent PT

## 2022-08-10 NOTE — Progress Notes (Unsigned)
Patient: Donna Cannon  Service Category: E/M  Provider: Gaspar Cola, MD  DOB: [birth year]  DOS: 08/11/2022  Location: Office  MRN: [id number]  Setting: Ambulatory outpatient  Referring Provider: Hortencia Pilar, MD  Type: Established Patient  Specialty: Interventional Pain Management  PCP: Hortencia Pilar, MD  Location: Remote location  Delivery: TeleHealth     Virtual Encounter - Pain Management PROVIDER NOTE: Information contained herein reflects review and annotations entered in association with encounter. Interpretation of such information and data should be left to medically-trained personnel. Information provided to patient can be located elsewhere in the medical record under "Patient Instructions". Document created using STT-dictation technology, any transcriptional errors that may result from process are unintentional.    Contact & Pharmacy Preferred: [phone] Home: [phone] (home) Mobile: [phone] (mobile) E-mail: [email or web address]  [place], [place] [address] Phone: [phone] Fax: [phone]   Pre-screening  Donna Cannon offered "in-person" vs "virtual" encounter. She indicated preferring virtual for this encounter.   Reason COVID-19*  Social distancing based on CDC and AMA recommendations.   I contacted Donna Cannon on 08/11/2022 via telephone.      I clearly identified myself as Gaspar Cola, MD. I verified that I was speaking with the correct person using two identifiers (Name: Donna Cannon, and date of birth: 1966-03-06).  Consent I sought verbal advanced consent from Donna Cannon for virtual visit interactions. I informed Donna Cannon of possible security and privacy concerns, risks, and limitations associated with providing "not-in-person" medical evaluation and management services. I also informed Donna Cannon of the availability of "in-person" appointments. Finally, I informed her that there would be  a charge for the virtual visit and that she could be  personally, fully or partially, financially responsible for it. Donna Cannon expressed understanding and agreed to proceed.   Historic Elements   Donna Cannon is a 56 y.o. year old, female patient evaluated today after our last contact on 07/03/2022. Donna Cannon  has a past medical history of Anxiety, Arthritis, Cancer ([place]), GERD (gastroesophageal reflux disease), Hypertension, Hypothyroidism, Mitral valve prolapse, PONV (postoperative nausea and vomiting), Sacroiliac dysfunction, Spinal headache, and Wears glasses. She also  has a past surgical history that includes Cesarean section; Tubal ligation; Endometrial ablation; Back surgery; Colonoscopy w/ biopsies and polypectomy; and Esophagogastroduodenoscopy (egd) with propofol (N/A, 12/12/2016). Donna Cannon has a current medication list which includes the following prescription(s): allopurinol, calcium carbonate, cholecalciferol, cyclobenzaprine, levothyroxine, losartan, metamucil fiber, metoprolol succinate, pantoprazole, senna-docusate, and colchicine. She  reports that she has never smoked. She has never used smokeless tobacco. She reports current alcohol use. She reports that she does not use drugs. Donna Cannon is allergic to hydrocodone.   HPI  Today, she is being contacted for  ***   Pharmacotherapy Assessment   Opioid Analgesic: None MME/day: 0 mg/day   Monitoring: [place] PMP: PDMP reviewed during this encounter.       Pharmacotherapy: No side-effects or adverse reactions reported. Compliance: No problems identified. Effectiveness: Clinically acceptable. Plan: Refer to "POC". UDS:  Summary  Date Value Ref Range Status  03/17/2022 Note  Final    Comment:    ==================================================================== Compliance Drug Analysis, Ur ==================================================================== Test                             Result       Flag  Units  Drug  Present and Declared for Prescription Verification   Cyclobenzaprine                PRESENT      EXPECTED   Desmethylcyclobenzaprine       PRESENT      EXPECTED    Desmethylcyclobenzaprine is an expected metabolite of    cyclobenzaprine.  Drug Present not Declared for Prescription Verification   Diphenhydramine                PRESENT      UNEXPECTED   Metoprolol                     PRESENT      UNEXPECTED ==================================================================== Test                      Result    Flag   Units      Ref Range   Creatinine              68               mg/dL      >=20 ==================================================================== Declared Medications:  The flagging and interpretation on this report are based on the  following declared medications.  Unexpected results may arise from  inaccuracies in the declared medications.   **Note: The testing scope of this panel includes these medications:   Cyclobenzaprine (Flexeril)   **Note: The testing scope of this panel does not include the  following reported medications:   Allopurinol (Zyloprim)  Calcium  Colchicine  Cyanocobalamin  Levothyroxine (Synthroid)  Losartan (Cozaar)  Pantoprazole (Protonix)  Sennosides (Senokot)  Vitamin D3 ==================================================================== For clinical consultation, please call [phone]. ====================================================================    No results found for: "CBDTHCR", "D8THCCBX", "D9THCCBX"   Laboratory Chemistry Profile   Renal Lab Results  Component Value Date   BUN 14 03/17/2022   CREATININE 1.10 (H) 03/17/2022   GFRAA >60 05/20/2016   GFRNONAA 59 (L) 03/17/2022    Hepatic Lab Results  Component Value Date   AST 69 (H) 03/17/2022   ALT 104 (H) 03/17/2022   ALBUMIN 4.7 03/17/2022   ALKPHOS 88 03/17/2022    Electrolytes Lab Results  Component Value Date   NA 140 03/17/2022   K 4.3  03/17/2022   CL 105 03/17/2022   CALCIUM 10.0 03/17/2022   MG 2.0 03/17/2022    Bone Lab Results  Component Value Date   25OHVITD1 67 03/17/2022   25OHVITD2 61 03/17/2022   25OHVITD3 5.7 03/17/2022    Inflammation (CRP: Acute Phase) (ESR: Chronic Phase) Lab Results  Component Value Date   CRP 0.8 03/17/2022   ESRSEDRATE 7 03/17/2022         Note: Above Lab results reviewed.  Imaging  DG PAIN CLINIC C-ARM 1-60 MIN NO REPORT Fluoro was used, but no Radiologist interpretation will be provided.  Please refer to "NOTES" tab for provider progress note.  Assessment  There were no encounter diagnoses.  Plan of Care  Problem-specific:  No problem-specific Assessment & Plan notes found for this encounter.  Donna Cannon has a current medication list which includes the following long-term medication(s): allopurinol, calcium carbonate, levothyroxine, losartan, pantoprazole, and colchicine.  Pharmacotherapy (Medications Ordered): No orders of the defined types were placed in this encounter.  Orders:  No orders of the defined types were placed in this encounter.  Follow-up plan:   No follow-ups on file.  Interventional Therapies  Risk  Complexity Considerations:   Estimated body mass index is 32.28 kg/m as calculated from the following:   Height as of this encounter: 5' 6"  (1.676 m).   Weight as of this encounter: 200 lb (90.7 kg). WNL   Planned  Pending:   Therapeutic bilateral lumbar facet RFA #1 (starting with the left side and following with the right 2-6 weeks later)   Under consideration:   Therapeutic bilateral lumbar facet RFA #1  Diagnostic midline caudal ESI + epidurogram #1  Diagnostic bilateral sacroiliac joint Blk #1  Diagnostic bilateral IA hip inj. #1  Diagnostic left cervical ESI #1  Diagnostic bilateral cervical facet MBB #1    Completed:   Diagnostic bilateral lumbar facet MBB x2 (06/19/2022) (100/100/100 x 3 days/0) (LBP & LEP improved)     Completed by other providers:   Right sacroiliac joint fusion (canceled on 05/28/2016)  Diagnostic/therapeutic bilateral L4-5 IA facet inj. (12/12/2019) (by Ashley Jacobs, MD) ([facility])  Diagnostic/therapeutic left L4-5 and L5-S1 TFESI (05/22/2021) (by Ashley Jacobs, MD) ([facility])    Therapeutic  Palliative (PRN) options:   None established     Recent Visits Date Type Provider Dept  07/03/22 Office Visit Milinda Pointer, MD [facility]  06/19/22 Procedure visit Milinda Pointer, MD [facility]  06/03/22 Office Visit Milinda Pointer, MD [facility]  05/22/22 Procedure visit Milinda Pointer, MD [facility]  Showing recent visits within past 90 days and meeting all other requirements Future Appointments Date Type Provider Dept  08/11/22 Office Visit Milinda Pointer, MD [facility]  Showing future appointments within next 90 days and meeting all other requirements  I discussed the assessment and treatment plan with the patient. The patient was provided an opportunity to ask questions and all were answered. The patient agreed with the plan and demonstrated an understanding of the instructions.  Patient advised to call back or seek an in-person evaluation if the symptoms or condition worsens.  Duration of encounter: *** minutes.  Note by: Gaspar Cola, MD Date: 08/11/2022; Time: 9:01 AM

## 2022-08-11 ENCOUNTER — Ambulatory Visit: Payer: BC Managed Care – PPO | Attending: Pain Medicine | Admitting: Pain Medicine

## 2022-08-11 DIAGNOSIS — M5137 Other intervertebral disc degeneration, lumbosacral region: Secondary | ICD-10-CM

## 2022-08-11 DIAGNOSIS — M47816 Spondylosis without myelopathy or radiculopathy, lumbar region: Secondary | ICD-10-CM | POA: Diagnosis not present

## 2022-08-11 DIAGNOSIS — M47817 Spondylosis without myelopathy or radiculopathy, lumbosacral region: Secondary | ICD-10-CM | POA: Diagnosis not present

## 2022-08-11 DIAGNOSIS — R937 Abnormal findings on diagnostic imaging of other parts of musculoskeletal system: Secondary | ICD-10-CM

## 2022-08-11 DIAGNOSIS — G8929 Other chronic pain: Secondary | ICD-10-CM

## 2022-08-11 DIAGNOSIS — M961 Postlaminectomy syndrome, not elsewhere classified: Secondary | ICD-10-CM

## 2022-08-11 NOTE — Patient Instructions (Signed)
______________________________________________________________________________________________  Body mass index (BMI)  Body mass index (BMI) is a common tool for deciding whether a person has an appropriate body weight.  It measures a persons weight in relation to their height.   According to the [facility] of health (NIH): A BMI of less than 18.5 means that a person is underweight. A BMI of between 18.5 and 24.9 is ideal. A BMI of between 25 and 29.9 is overweight. A BMI over 30 indicates obesity.  Weight Management Required  URGENT: Your weight has been found to be adversely affecting your health.  Dear [name]:  Your current Estimated body mass index is 32.28 kg/m as calculated from the following:   Height as of 06/19/22: $RemoveBe'5\' 6"'hGFmrkpvI$  (1.676 m).   Weight as of 06/19/22: 200 lb (90.7 kg).  Please use the table below to identify your weight category and associated incidence of chronic pain, secondary to your weight.  Body Mass Index (BMI) Classification BMI level (kg/m2) Category Associated incidence of chronic pain  <18  Underweight   18.5-24.9 Ideal body weight   25-29.9 Overweight  20%  30-34.9 Obese (Class I)  68%  35-39.9 Severe obesity (Class II)  136%  >40 Extreme obesity (Class III)  254%   In addition: You will be considered "Morbidly Obese", if your BMI is above 30 and you have one or more of the following conditions which are known to be caused and/or directly associated with obesity: 1.    Type 2 Diabetes (Which in turn can lead to cardiovascular diseases (CVD), stroke, peripheral vascular diseases (PVD), retinopathy, nephropathy, and neuropathy) 2.    Cardiovascular Disease (High Blood Pressure; Congestive Heart Failure; High Cholesterol; Coronary Artery Disease; Angina; or History of Heart Attacks) 3.    Breathing problems (Asthma; obesity-hypoventilation syndrome; obstructive sleep apnea; chronic inflammatory airway disease; reactive airway disease; or shortness  of breath) 4.    Chronic kidney disease 5.    Liver disease (nonalcoholic fatty liver disease) 6.    High blood pressure 7.    Acid reflux (gastroesophageal reflux disease; heartburn) 8.    Osteoarthritis (OA) (with any of the following: hip pain; knee pain; and/or low back pain) 9.    Low back pain (Lumbar Facet Syndrome; and/or Degenerative Disc Disease) 10.  Hip pain (Osteoarthritis of hip) (For every 1 lbs of added body weight, there is a 2 lbs increase in pressure inside of each hip articulation. 1:2 mechanical relationship) 11.  Knee pain (Osteoarthritis of knee) (For every 1 lbs of added body weight, there is a 4 lbs increase in pressure inside of each knee articulation. 1:4 mechanical relationship) (patients with a BMI>30 kg/m2 were 6.8 times more likely to develop knee OA than normal-weight individuals) 12.  Cancer: Epidemiological studies have shown that obesity is a risk factor for: post-menopausal breast cancer; cancers of the endometrium, colon and kidney cancer; malignant adenomas of the oesophagus. Obese subjects have an approximately 1.5-3.5-fold increased risk of developing these cancers compared with normal-weight subjects, and it has been estimated that between 15 and 45% of these cancers can be attributed to overweight. More recent studies suggest that obesity may also increase the risk of other types of cancer, including pancreatic, hepatic and gallbladder cancer. (Ref: Obesity and cancer. Pischon T, Nthlings U, [employer] H. Proc Nutr Soc. 2008 May;67(2):128-45. doi: 87.6811/X7262035597416384.) The International Agency for Research on Cancer (IARC) has identified 13 cancers associated with overweight and obesity: meningioma, multiple myeloma, adenocarcinoma of the esophagus, and cancers of the thyroid,  postmenopausal breast cancer, gallbladder, stomach, liver, pancreas, kidney, ovaries, uterus, colon and rectal (colorectal) cancers. 52 percent of all cancers diagnosed in women and 24  percent of those diagnosed in men are associated with overweight and obesity.  Recommendation: At this point it is urgent that you take a step back and concentrate in loosing weight. Dedicate 100% of your efforts on this task. Nothing else will improve your health more than bringing your weight down and your BMI to less than 30. If you are here, you probably have chronic pain. We know that most chronic pain patients have difficulty exercising secondary to their pain. For this reason, you must rely on proper nutrition and diet in order to lose the weight. If your BMI is above 40, you should seriously consider bariatric surgery. A realistic goal is to lose 10% of your body weight over a period of 12 months.  Be honest to yourself, if over time you have unsuccessfully tried to lose weight, then it is time for you to seek professional help and to enter a medically supervised weight management program, and/or undergo bariatric surgery. Stop procrastinating.   Pain management considerations:  1.    Pharmacological Problems: Be advised that the use of opioid analgesics (oxycodone; hydrocodone; morphine; methadone; codeine; and all of their derivatives) have been associated with decreased metabolism and weight gain.  For this reason, should we see that you are unable to lose weight while taking these medications, it may become necessary for [place] to taper down and indefinitely discontinue them.  2.    Technical Problems: The incidence of successful interventional therapies decreases as the patient's BMI increases. It is much more difficult to accomplish a safe and effective interventional therapy on a patient with a BMI above 35. 3.    Radiation Exposure Problems: The x-rays machine, used to accomplish injection therapies, will automatically increase their x-ray output in order to capture an appropriate bone image. This means that radiation exposure increases exponentially with the patient's BMI. (The higher the BMI,  the higher the radiation exposure.) Although the level of radiation used at a given time is still safe to the patient, it is not for the physician and/or assisting staff. Unfortunately, radiation exposure is accumulative. Because physicians and the staff have to do procedures and be exposed on a daily basis, this can result in health problems such as cancer and radiation burns. Radiation exposure to the staff is monitored by the radiation batches that they wear. The exposure levels are reported back to the staff on a quarterly basis. Depending on levels of exposure, physicians and staff may be obligated by law to decrease this exposure. This means that they have the right and obligation to refuse providing therapies where they may be overexposed to radiation. For this reason, physicians may decline to offer therapies such as radiofrequency ablation or implants to patients with a BMI above 40. 4.    Current Trends: Be advised that the current trend is to no longer offer certain therapies to patients with a BMI equal to, or above 35, due to increase perioperative risks, increased technical procedural difficulties, and excessive radiation exposure to healthcare personnel.  ______________________________________________________________________________________________

## 2022-09-02 ENCOUNTER — Ambulatory Visit: Payer: BC Managed Care – PPO | Attending: Pain Medicine | Admitting: Physical Therapy

## 2022-09-02 DIAGNOSIS — M5459 Other low back pain: Secondary | ICD-10-CM | POA: Insufficient documentation

## 2022-09-02 DIAGNOSIS — M6281 Muscle weakness (generalized): Secondary | ICD-10-CM | POA: Diagnosis present

## 2022-09-02 DIAGNOSIS — G894 Chronic pain syndrome: Secondary | ICD-10-CM | POA: Diagnosis present

## 2022-09-02 NOTE — Therapy (Unsigned)
OUTPATIENT PHYSICAL THERAPY THORACOLUMBAR EVALUATION   Patient Name: VENNA BERBERICH MRN: [id number] DOB:Jun 17, 1966, 56 y.o., female Today's Date: 09/03/2022   PT End of Session - 09/03/22 1027     Visit Number 1    Number of Visits 13    Date for PT Re-Evaluation 10/16/22    Authorization Type BCBC 2023, 30 visit limit for PT/OT combined per year    Progress Note Due on Visit 10    PT Start Time 1305    PT Stop Time 1351    PT Time Calculation (min) 46 min    Activity Tolerance Patient limited by pain    Behavior During Therapy WFL for tasks assessed/performed             Past Medical History:  Diagnosis Date   Anxiety    Arthritis    Cancer ([place])    melanoma   GERD (gastroesophageal reflux disease)    Hypertension    Hypothyroidism    Mitral valve prolapse    " mild"   PONV (postoperative nausea and vomiting)    Sacroiliac dysfunction    left   Spinal headache    Wears glasses    Past Surgical History:  Procedure Laterality Date   BACK SURGERY     lumbar decompression   CESAREAN SECTION     COLONOSCOPY W/ BIOPSIES AND POLYPECTOMY     ENDOMETRIAL ABLATION     ESOPHAGOGASTRODUODENOSCOPY (EGD) WITH PROPOFOL N/A 12/12/2016   Procedure: ESOPHAGOGASTRODUODENOSCOPY (EGD) WITH PROPOFOL;  Surgeon: Jonathon Bellows, MD;  Location: [facility] ENDOSCOPY;  Service: Endoscopy;  Laterality: N/A;   TUBAL LIGATION     Patient Active Problem List   Diagnosis Date Noted   Lumbosacral facet arthropathy (Multilevel) (Bilateral) 03/17/2022   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 03/17/2022   Lumbar foraminal stenosis (L4-5) (Bilateral) 03/17/2022   DDD (degenerative disc disease), lumbosacral 03/17/2022   Cervical facet hypertrophy 03/17/2022   DDD (degenerative disc disease), cervical 03/17/2022   Chronic feet pain (5th area of Pain) (Bilateral) 03/17/2022   Chronic wrist pain (7th area of Pain) (Right) 03/17/2022   Chronic low back pain (1ry area of Pain)  (Bilateral) (L>R) w/o sciatica 03/17/2022   Failed back surgical syndrome (2015) 03/17/2022   Chronic lower extremity pain (2ry area of Pain) (Bilateral) (L>R) 03/17/2022   Chronic lower extremity radicular pain (S1 dermatome) (Bilateral) 03/17/2022   Chronic upper extremity pain (3ry area of Pain) (Left) 03/17/2022   Chronic neck pain (4th area of Pain) (Posterior) (Bilateral) (L>R) 03/17/2022   Chronic ankle pain (6th area of Pain) (Bilateral) 03/17/2022   Grade 1 Retrolisthesis of cervical spine (C3/C4 and C5/C6) 03/17/2022   Decreased range of motion of lumbar spine 03/17/2022   Painful cervical range of motion 03/17/2022   Impaired range of motion of cervical spine 03/17/2022   Lumbar facet syndrome 03/17/2022   Chronic hip pain (Bilateral) 03/17/2022   Chronic sacroiliac joint pain (Bilateral) 03/17/2022   Decreased GFR 03/17/2022   Elevated liver enzymes 03/17/2022   Chronic pain syndrome 03/13/2022   Pharmacologic therapy 03/13/2022   Disorder of skeletal system 03/13/2022   Problems influencing health status 03/13/2022   Abnormal MRI, cervical spine (11/18/2021) 03/13/2022   Abnormal MRI, lumbar spine (10/16/2019) 03/13/2022   Radiculopathy of cervical region 11/05/2021   Spinal stenosis of cervical region 11/05/2021   Gouty arthritis of great toe (Right) 06/06/2021   Intervertebral disc disorder with radiculopathy of lumbar region 04/30/2021   Thoracic spine pain 04/30/2021  Osteopenia of multiple sites 03/25/2021   Secondary hyperparathyroidism of renal origin ([place]) 03/25/2021   BMI 32.0-32.9,adult 09/14/2020   Spondylosis of lumbar region without myelopathy or radiculopathy 09/14/2020   Stage 3a chronic kidney disease ([place]) 07/11/2020   Intervertebral disc stenosis of neural canal of cervical region 08/30/2019   SOBOE (shortness of breath on exertion) 06/13/2019   PVC (premature ventricular contraction) 12/29/2018   Chronic low back pain (Bilateral) (L>R) w/ sciatica  (Bilateral) (L>R) 03/16/2017   Sciatica 03/16/2017   Gastroesophageal reflux disease 11/17/2016   Gastroesophageal reflux disease with esophagitis 11/13/2016   Benign essential hypertension 09/18/2015   Mixed hyperlipidemia 12/06/2014   Palpitations 12/06/2014   History of melanoma 11/20/2014   Mitral valve prolapse 11/16/2013   Basal cell carcinoma 10/21/2013   History of basal cell cancer 10/21/2013   Acquired hypothyroidism 09/12/2013   Anxiety 09/12/2013   Increased frequency of urination 09/12/2013    PCP: Hortencia Pilar, MD  REFERRING PROVIDER: Milinda Pointer, MD  REFERRING DIAGNOSIS:  M47.816 (ICD-10-CM) - Spondylosis without myelopathy or radiculopathy, lumbar region  M54.50 (ICD-10-CM) - Low back pain, unspecified  G89.29 (ICD-10-CM) - Other chronic pain  M51.37 (ICD-10-CM) - Other intervertebral disc degeneration, lumbosacral region  M96.1 (ICD-10-CM) - Postlaminectomy syndrome, not elsewhere classified  R93.7 (ICD-10-CM) - Abnormal findings on diagnostic imaging of other parts of musculoskeletal system    THERAPY DIAG: Other low back pain  Chronic pain syndrome  Muscle weakness (generalized)  RATIONALE FOR EVALUATION AND TREATMENT: Rehabilitation  ONSET DATE: 10 years ago with progressive worsening  FOLLOW UP APPT WITH PROVIDER: Yes , pt to f/u with Dr. Dossie Arbour next month    SUBJECTIVE:                                                                                                                                                                                         Chief Complaint: Patient is a 56 year old female with primary complaint of low back pain s/p lumbar laminectomy at L4-5 in 2016. Pt is s/p 2 medial branch blocks that did help significantly. Pt's referring provider is holding on additional injections until pt tries other conservative measures for her pain.  Pertinent History Patient is a 56 year old female with primary complaint of low back  pain s/p lumbar laminectomy at L4-5 in 2016; pt reports episodes of low back pain even at younger age. Pt has chronic low back pain with episodic flare-ups with involved history of conservative interventions and eventually L4-5 laminectomy in 2016. Pt is s/p 2 medial branch blocks that did help significantly. Patient has referring diagnosis of lumbar facet syndrome. Pt states her condition is degenerative and she  feels that it is not going to get better. She reports having difficulty with walking very far. Pt reports responding well to heat, but having to back down from this due to overuse and getting superficial burns. Pt works sedentary job. No sciatica; hx of ESI that notably improved sciatic-type symptoms. Patient has comorbid neck pain with Hx of C6-7 retrolisthesis. Pt reports disturbed sleep in the night with lying on her R side; she is able to get more comfortable with lying on L side. Pt denies bowel/bladder changes. No sudden weight change. (+) shopping cart sign.  Pain:  Pain Intensity: Present: 6/10, Best: 4/10, Worst: 8/10 Pain location: Varying between R and L lumbar flank Pain Quality: stabbing Radiating: Yes , varying between R and L flank Numbness/Tingling: Yes, in her feet intermittently Focal Weakness: Yes, weakness in her ankles, Hx of several ankle sprains Aggravating factors: twisting of her back, vacuuming/mopping, prolonged sitting, prolonged standing Relieving factors: lying flat  on bed, on the move  24-hour pain behavior: worse in PM How long can you sit: up to 1 hour prior to severe pain  How long can you stand: 10 minutes History of prior back injury, pain, surgery, or therapy: Yes, Hx of lumbar laminectomy in 2016 Falls: Has patient fallen in last 6 months? 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[employer], desk work  mainly [place]: going to [employer], walking out in nature Red flags (bowel/bladder changes, saddle paresthesia, personal history of cancer, h/o spinal tumors, h/o compression fx, h/o abdominal aneurysm, abdominal pain, chills/fever, night sweats, nausea, vomiting, unrelenting pain, first onset of insidious LBP <20 y/o): Positive for skin cancer  Precautions: Osteopenia  Weight Bearing Restrictions: No  Living Environment Lives with: lives with their spouse Lives in: House/apartment Has following equipment at home: None   Patient Goals: More strength, more stamina for performing activity     OBJECTIVE:   Patient Surveys  FOTO 30, predicted score of 69  Cognition Patient is oriented to person, place, and time.  Recent memory is intact.  Remote memory is intact.  Attention span and concentration are intact.  Expressive speech is intact.  Patient's fund of knowledge is within normal limits for educational level.     Gross Musculoskeletal Assessment Tremor: None Bulk: Normal No visible step-off along spinal column, no signs of scoliosis   GAIT: Unremarkable   Posture: Lumbar lordosis: Decreased Thoracic kyphosis: Increased Forward head, rounded shoulders Lumbar lateral shift: Negative   AROM  AROM (Normal range in degrees) AROM  09/03/2022  Lumbar   Flexion (65) 50%*   Extension (30) <25%*  Right lateral flexion (25) 50%*   Left lateral flexion (25) 50%*   Right rotation (30) 25%*  Left rotation (30) 25%*      Hip Right Left  Flexion (125) WNL* WNL*  Extension (15)    Abduction (40)    Adduction     Internal Rotation (45)    External Rotation (45) WNL WNL      Knee    Flexion (135)    Extension (0)        Ankle    Dorsiflexion (20)    Plantarflexion (50)    Inversion (35)    Eversion (15    (* = pain; Blank rows = not tested)   LE MMT:  MMT (out of 5) Right 09/03/2022 Left 09/03/2022  Hip flexion 4* 4+*  Hip extension    Hip abduction 4*  4*  Hip adduction 5 5  Hip internal rotation    Hip external rotation    Knee flexion 4+ 4+  Knee extension 4+ 4+  Ankle dorsiflexion 5 5  Ankle plantarflexion    Ankle inversion    Ankle eversion    (* = pain; Blank rows = not tested)   Sensation Grossly intact to light touch bilateral LEs as determined by testing dermatomes L2-S2. Proprioception, and hot/cold testing deferred on this date.   Reflexes R/L Knee Jerk (L3/4): 2+/2+  Ankle Jerk (S1/2): 2+/2+  UMN signs (-)   Muscle Length Hamstrings: R: 20 deg knee extension lacking,  L: Positive for pain prior to tissue restriction; 30 deg knee extension lacking [place] (quadriceps): R: Not examined L: Not examined   Palpation  Location LEFT  RIGHT           Lumbar paraspinals 2 2  Quadratus Lumborum    Gluteus Maximus 1 1  Gluteus Medius 1 2  Deep hip external rotators    PSIS 1 1  Fortin's Area (SIJ) 1 1  Greater Trochanter    (Blank rows = not tested) Graded on 0-4 scale (0 = no pain, 1 = pain, 2 = pain with wincing/grimacing/flinching, 3 = pain with withdrawal, 4 = unwilling to allow palpation), (Blank rows = not tested)   Passive Accessory Intervertebral Motion Reproduction of back pain with CPA L1-S1 and UPA bilaterally L1-L5, pain prior to restriction   SPECIAL TESTS Lumbar Radiculopathy and Discogenic: Centralization and Peripheralization (SN 92, -LR 0.12): Not examined Slump (SN 83, -LR 0.32): R: Negative L: Positive SLR (SN 92, -LR 0.29): R: Positive L:  Positive Crossed SLR (SP 90): R: Not examined L: Negative General lumbar distraction: Negative Compression: Negative   Facet Joint: Extension-Rotation (SN 100, -LR 0.0): R: Not done L: Not done  Lumbar Foraminal Stenosis: Lumbar quadrant (SN 70): R: Positive L: Positive  SIJ:  SI Compression: Positive      TODAY'S TREATMENT  Patient education on current condition, anatomy involved, prognosis, plan of care. Discussed at length normal degenerative  findings in asymptomatic patient populations and poor correlation with imaging findings and pain/symptoms. Discussed importance of daily movement and benefit of regular movement for joint health.     PATIENT EDUCATION:  Education details: see above for patient education details  Person educated: Patient Education method: Explanation Education comprehension: verbalized understanding   HOME EXERCISE PROGRAM: Formal HEP to be initiated next visit   ASSESSMENT:  CLINICAL IMPRESSION: Patient is a 56 y.o. female who was seen today for physical therapy evaluation and treatment for back pain s/p lumbar laminectomy at L4-5 in 2016 and multiple lumbar spine injections with pain management clinic considering radiofrequency ablation of affected spinal levels. Pt has complicated history with musculoskeletal pain with several episodes of low back pain at young age and several regions involved with MSK history on chart review today. Past medical history including: hypothyroidism, chronic kidney disease, anxiety, mixed HLD, osteopenia, GERD. Objective impairments include decreased activity tolerance, decreased mobility, difficulty walking, decreased ROM, decreased strength, hypomobility, impaired flexibility, postural dysfunction, and pain. These impairments are limiting patient from cleaning, driving, shopping, and community activity. Personal factors including Past/current experiences, Time since onset of injury/illness/exacerbation, 3+ comorbidities (see above) and patient's concern that her condition cannot get better are also affecting patient's functional outcome. Patient will benefit from skilled PT to address above impairments and improve overall  function.  REHAB POTENTIAL: Fair chronic pain with involved interventional history, complicated MSK pain history, comorbiditites  CLINICAL DECISION MAKING: Unstable/unpredictable  EVALUATION COMPLEXITY: Moderate   GOALS:  SHORT TERM GOALS: Target date:  09/17/2022  Pt will be independent with HEP to improve strength and decrease back pain to improve pain-free function at home and work. Baseline: 09/02/22: Baseline HEP to be initiated on visit # 2.  Goal status: INITIAL   LONG TERM GOALS: Target date: 10/16/2022  Pt will increase FOTO to at least 46 to demonstrate significant improvement in function at home and work related to back pain  Baseline: 09/02/22: 30 Goal status: INITIAL  2.  Pt will decrease worst back pain by at least 2 points on the NPRS in order to demonstrate clinically significant reduction in back pain. Baseline: 09/02/22: 8/10 pain at worst Goal status: INITIAL  3.  Pt will have thoracolumbar AROM at least 75% or greater for all motions without increase in baseline pain as needed for reaching, self-care ADLs, bending to retrive item       Baseline: 09/02/22: Pain and significant motion loss with all directions Goal status: INITIAL  4.  Patient will tolerate standing/ambulatory activity up to 30 minutes without increase in baseline pain as needed for improved ability to complete community outings, cooking/cleaning, and leisure activities with her family Baseline: 09/02/22: Pt tolerates standing up to 10 minutes Goal status: INITIAL  5.  Patient will tolerate sitting up to 1 hour or greater without increase in baseline pain as needed for improved tolerance of leisure activity with her spouse and family and driving/riding in vehicle Baseline: 09/02/22: Severe pain following 1 hour of sitting Goal status: INITIAL   PLAN: PT FREQUENCY: 2x/week  PT DURATION: 6 weeks  PLANNED INTERVENTIONS: Therapeutic exercises, Therapeutic activity, Neuromuscular re-education,  Patient/Family education, Joint mobilization, Aquatic Therapy, Dry Needling, Electrical stimulation, Spinal mobilization, Cryotherapy, Moist heat, and Manual therapy  PLAN FOR NEXT SESSION: Pain neuroscience/persistent pain education, review normal asymptomatic  imaging findings. Manual therapy and modalities as needed for pain, graded thoracolumbar and hip movement as tolerated. Utilize aerobic exercise for nervous system down-regulation.   Valentina Gu, PT, DPT #[id number]  Eilleen Kempf 09/03/2022, 10:29 AM

## 2022-09-03 ENCOUNTER — Encounter: Payer: Self-pay | Admitting: Physical Therapy

## 2022-09-04 ENCOUNTER — Encounter: Payer: Self-pay | Admitting: Physical Therapy

## 2022-09-04 ENCOUNTER — Ambulatory Visit: Payer: BC Managed Care – PPO | Admitting: Physical Therapy

## 2022-09-04 DIAGNOSIS — M5459 Other low back pain: Secondary | ICD-10-CM

## 2022-09-04 DIAGNOSIS — G894 Chronic pain syndrome: Secondary | ICD-10-CM

## 2022-09-04 DIAGNOSIS — M6281 Muscle weakness (generalized): Secondary | ICD-10-CM

## 2022-09-04 NOTE — Therapy (Signed)
OUTPATIENT PHYSICAL THERAPY TREATMENT NOTE   Patient Name: Donna Cannon MRN: [id number] DOB:1966/03/28, 56 y.o., female Today's Date: 09/04/2022    END OF SESSION:   PT End of Session - 09/04/22 1256     Visit Number 2    Number of Visits 13    Date for PT Re-Evaluation 10/16/22    Authorization Type BCBC 2023, 30 visit limit for PT/OT combined per year    Progress Note Due on Visit 10    PT Start Time 1258    PT Stop Time 1342    PT Time Calculation (min) 44 min    Activity Tolerance Patient limited by pain    Behavior During Therapy WFL for tasks assessed/performed             Past Medical History:  Diagnosis Date   Anxiety    Arthritis    Cancer ([place])    melanoma   GERD (gastroesophageal reflux disease)    Hypertension    Hypothyroidism    Mitral valve prolapse    " mild"   PONV (postoperative nausea and vomiting)    Sacroiliac dysfunction    left   Spinal headache    Wears glasses    Past Surgical History:  Procedure Laterality Date   BACK SURGERY     lumbar decompression   CESAREAN SECTION     COLONOSCOPY W/ BIOPSIES AND POLYPECTOMY     ENDOMETRIAL ABLATION     ESOPHAGOGASTRODUODENOSCOPY (EGD) WITH PROPOFOL N/A 12/12/2016   Procedure: ESOPHAGOGASTRODUODENOSCOPY (EGD) WITH PROPOFOL;  Surgeon: Jonathon Bellows, MD;  Location: [facility] ENDOSCOPY;  Service: Endoscopy;  Laterality: N/A;   TUBAL LIGATION     Patient Active Problem List   Diagnosis Date Noted   Lumbosacral facet arthropathy (Multilevel) (Bilateral) 03/17/2022   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 03/17/2022   Lumbar foraminal stenosis (L4-5) (Bilateral) 03/17/2022   DDD (degenerative disc disease), lumbosacral 03/17/2022   Cervical facet hypertrophy 03/17/2022   DDD (degenerative disc disease), cervical 03/17/2022   Chronic feet pain (5th area of Pain) (Bilateral) 03/17/2022   Chronic wrist pain (7th area of Pain) (Right) 03/17/2022   Chronic low back pain (1ry area of  Pain) (Bilateral) (L>R) w/o sciatica 03/17/2022   Failed back surgical syndrome (2015) 03/17/2022   Chronic lower extremity pain (2ry area of Pain) (Bilateral) (L>R) 03/17/2022   Chronic lower extremity radicular pain (S1 dermatome) (Bilateral) 03/17/2022   Chronic upper extremity pain (3ry area of Pain) (Left) 03/17/2022   Chronic neck pain (4th area of Pain) (Posterior) (Bilateral) (L>R) 03/17/2022   Chronic ankle pain (6th area of Pain) (Bilateral) 03/17/2022   Grade 1 Retrolisthesis of cervical spine (C3/C4 and C5/C6) 03/17/2022   Decreased range of motion of lumbar spine 03/17/2022   Painful cervical range of motion 03/17/2022   Impaired range of motion of cervical spine 03/17/2022   Lumbar facet syndrome 03/17/2022   Chronic hip pain (Bilateral) 03/17/2022   Chronic sacroiliac joint pain (Bilateral) 03/17/2022   Decreased GFR 03/17/2022   Elevated liver enzymes 03/17/2022   Chronic pain syndrome 03/13/2022   Pharmacologic therapy 03/13/2022   Disorder of skeletal system 03/13/2022   Problems influencing health status 03/13/2022   Abnormal MRI, cervical spine (11/18/2021) 03/13/2022   Abnormal MRI, lumbar spine (10/16/2019) 03/13/2022   Radiculopathy of cervical region 11/05/2021   Spinal stenosis of cervical region 11/05/2021   Gouty arthritis of great toe (Right) 06/06/2021   Intervertebral disc disorder with radiculopathy of lumbar region 04/30/2021  Thoracic spine pain 04/30/2021   Osteopenia of multiple sites 03/25/2021   Secondary hyperparathyroidism of renal origin ([place]) 03/25/2021   BMI 32.0-32.9,adult 09/14/2020   Spondylosis of lumbar region without myelopathy or radiculopathy 09/14/2020   Stage 3a chronic kidney disease ([place]) 07/11/2020   Intervertebral disc stenosis of neural canal of cervical region 08/30/2019   SOBOE (shortness of breath on exertion) 06/13/2019   PVC (premature ventricular contraction) 12/29/2018   Chronic low back pain (Bilateral) (L>R) w/  sciatica (Bilateral) (L>R) 03/16/2017   Sciatica 03/16/2017   Gastroesophageal reflux disease 11/17/2016   Gastroesophageal reflux disease with esophagitis 11/13/2016   Benign essential hypertension 09/18/2015   Mixed hyperlipidemia 12/06/2014   Palpitations 12/06/2014   History of melanoma 11/20/2014   Mitral valve prolapse 11/16/2013   Basal cell carcinoma 10/21/2013   History of basal cell cancer 10/21/2013   Acquired hypothyroidism 09/12/2013   Anxiety 09/12/2013   Increased frequency of urination 09/12/2013    PCP: Hortencia Pilar, MD   REFERRING PROVIDER: Milinda Pointer, MD   REFERRING DIAGNOSIS:  M47.816 (ICD-10-CM) - Spondylosis without myelopathy or radiculopathy, lumbar region  M54.50 (ICD-10-CM) - Low back pain, unspecified  G89.29 (ICD-10-CM) - Other chronic pain  M51.37 (ICD-10-CM) - Other intervertebral disc degeneration, lumbosacral region  M96.1 (ICD-10-CM) - Postlaminectomy syndrome, not elsewhere classified  R93.7 (ICD-10-CM) - Abnormal findings on diagnostic imaging of other parts of musculoskeletal system      THERAPY DIAG: Other low back pain   Chronic pain syndrome   Muscle weakness (generalized)   RATIONALE FOR EVALUATION AND TREATMENT: Rehabilitation   ONSET DATE: 10 years ago with progressive worsening   FOLLOW UP APPT WITH PROVIDER: Yes , pt to f/u with Dr. Dossie Arbour next month      PERTINENT HISTORY: Patient is a 56 year old female with primary complaint of low back pain s/p lumbar laminectomy at L4-5 in 2016; pt reports episodes of low back pain even at younger age. Pt has chronic low back pain with episodic flare-ups with involved history of conservative interventions and eventually L4-5 laminectomy in 2016. Pt is s/p 2 medial branch blocks that did help significantly. Patient has referring diagnosis of lumbar facet syndrome. Pt states her condition is degenerative and she feels that it is not going to get better. She reports having difficulty  with walking very far. Pt reports responding well to heat, but having to back down from this due to overuse and getting superficial burns. Pt works sedentary job. No sciatica; hx of ESI that notably improved sciatic-type symptoms. Patient has comorbid neck pain with Hx of C6-7 retrolisthesis. Pt reports disturbed sleep in the night with lying on her R side; she is able to get more comfortable with lying on L side. Pt denies bowel/bladder changes. No sudden weight change. (+) shopping cart sign.   Pain:  Pain Intensity: Present: 6/10, Best: 4/10, Worst: 8/10 Pain location: Varying between R and L lumbar flank Pain Quality: stabbing Radiating: Yes , varying between R and L flank Numbness/Tingling: Yes, in her feet intermittently Focal Weakness: Yes, weakness in her ankles, Hx of several ankle sprains Aggravating factors: twisting of her back, vacuuming/mopping, prolonged sitting, prolonged standing Relieving factors: lying flat  on bed, on the move  24-hour pain behavior: worse in PM How long can you sit: up to 1 hour prior to severe pain  How long can you stand: 10 minutes History of prior back injury, pain, surgery, or therapy: Yes, Hx of lumbar laminectomy in 2016 Falls: Has patient fallen  in last 6 months? 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[employer], desk work mainly [place]: going to [employer], walking out in nature Red flags (bowel/bladder changes, saddle paresthesia, personal history of cancer, h/o spinal tumors, h/o compression fx, h/o abdominal aneurysm, abdominal pain, chills/fever, night sweats, nausea, vomiting, unrelenting pain, first onset of insidious LBP <20 y/o): Positive for skin cancer   Precautions: Osteopenia   Weight Bearing Restrictions: No   Living Environment Lives with: lives with their spouse Lives in:  House/apartment Has following equipment at home: None     Patient Goals: More strength, more stamina for performing activity     OBJECTIVE: (objective measures completed at initial evaluation unless otherwise dated)  Patient Surveys  FOTO 30, predicted score of 66   Cognition Patient is oriented to person, place, and time.  Recent memory is intact.  Remote memory is intact.  Attention span and concentration are intact.  Expressive speech is intact.  Patient's fund of knowledge is within normal limits for educational level.                            Gross Musculoskeletal Assessment Tremor: None Bulk: Normal No visible step-off along spinal column, no signs of scoliosis     GAIT: Unremarkable     Posture: Lumbar lordosis: Decreased Thoracic kyphosis: Increased Forward head, rounded shoulders Lumbar lateral shift: Negative     AROM       AROM (Normal range in degrees) AROM  09/03/2022  Lumbar    Flexion (65) 50%*   Extension (30) <25%*  Right lateral flexion (25) 50%*   Left lateral flexion (25) 50%*   Right rotation (30) 25%*  Left rotation (30) 25%*         Hip Right Left  Flexion (125) WNL* WNL*  Extension (15)      Abduction (40)      Adduction       Internal Rotation (45)      External Rotation (45) WNL WNL         Knee      Flexion (135)      Extension (0)             Ankle      Dorsiflexion (20)      Plantarflexion (50)      Inversion (35)      Eversion (15      (* = pain; Blank rows = not tested)     LE MMT:   MMT (out of 5) Right 09/03/2022 Left 09/03/2022  Hip flexion 4* 4+*  Hip extension      Hip abduction 4* 4*  Hip adduction 5 5  Hip internal rotation      Hip external rotation      Knee flexion 4+ 4+  Knee extension 4+ 4+  Ankle dorsiflexion 5 5  Ankle plantarflexion      Ankle inversion      Ankle eversion      (* = pain; Blank rows = not tested)     Sensation Grossly intact to light touch bilateral LEs as  determined by testing dermatomes L2-S2. Proprioception, and hot/cold testing deferred on this date.     Reflexes R/L Knee Jerk (L3/4): 2+/2+  Ankle Jerk (S1/2): 2+/2+  UMN signs (-)  Muscle Length Hamstrings: R: 20 deg knee extension lacking,  L: Positive for pain prior to tissue restriction; 30 deg knee extension lacking [place] (quadriceps): R: Not examined L: Not examined     Palpation   Location LEFT  RIGHT           Lumbar paraspinals 2 2  Quadratus Lumborum      Gluteus Maximus 1 1  Gluteus Medius 1 2  Deep hip external rotators      PSIS 1 1  Fortin's Area (SIJ) 1 1  Greater Trochanter      (Blank rows = not tested) Graded on 0-4 scale (0 = no pain, 1 = pain, 2 = pain with wincing/grimacing/flinching, 3 = pain with withdrawal, 4 = unwilling to allow palpation), (Blank rows = not tested)     Passive Accessory Intervertebral Motion Reproduction of back pain with CPA L1-S1 and UPA bilaterally L1-L5, pain prior to restriction   SPECIAL TESTS Lumbar Radiculopathy and Discogenic: Centralization and Peripheralization (SN 92, -LR 0.12): Not examined Slump (SN 83, -LR 0.32): R: Negative L: Positive SLR (SN 92, -LR 0.29): R: Positive L:  Positive Crossed SLR (SP 90): R: Not examined L: Negative General lumbar distraction: Negative Compression: Negative    Facet Joint: Extension-Rotation (SN 100, -LR 0.0): R: Not done L: Not done   Lumbar Foraminal Stenosis: Lumbar quadrant (SN 70): R: Positive L: Positive   SIJ:  SI Compression: Positive          TODAY'S TREATMENT    SUBJECTIVE: Patient reports some soreness after IE and reports using heating pad for this. Patient reports moderate pain at arrival. She has ongoing pain affecting primarily lumbar paraspinal region bilaterally without lower limb referral. She reports moderate referral to lateral hips/iliolumbar region bilaterally.  PAIN:  Are you having pain? Yes: NPRS scale: 4-5/10 Pain location: Across low  back   MHP (unbilled) utilized during video-based patient education at beginning of session  for analgesic effect and improved soft tissue extensibility, along low back in prone x 5 minutes     Manual Therapy - for symptom modulation, soft tissue sensitivity and mobility, joint mobility, ROM  STM/DTM bilateral L1-L5 erector spinae and QL, IASTM with Hypervolt to bilateral lumbar paraspinals and bilateral gluteus medius/maximus CPA gr II-III along T7-T12, gr I-II L3-5 with transient pain reproduction  -Pt presents with mid to lower thoracic hypomobility   Therapeutic Exercise - for improved soft tissue flexibility and extensibility as needed for ROM, neurodyanmics to improve neural/dural tissue mobility and reduce pain related to adherent nerve root, graded movement to improve tolerance of accessing thoracolumbar ROM  PATIENT EDUCATION: Discussed PNE concepts (utilized video-based format to introduce these concepts for 4:30 min), asymptomatic imaging findings, benefit of aerobic exercise and graded movement to reduce threat with movement; discussed use of walking program with modified duration based on activity tolerance. HEP update and review.  Double knee to chest, with towel wrapped around knees to assist her hands in pulling; 2x10 Lower trunk rotations; 2x10 ea dir Open book; 1x10 on each side      PATIENT EDUCATION:  Education details: see above for patient education details   Person educated: Patient Education method: Explanation Education comprehension: verbalized understanding      HOME EXERCISE PROGRAM: Access Code: [email or web address] URL: [email or web address] Date: 09/06/2022 Prepared by: Valentina Gu  Exercises - Supine Double Knee to Chest  - 2-3 x daily - 7 x weekly - 1-2 sets - 10 reps - 1sec hold - Supine Lower  Trunk Rotation  - 2 x daily - 7 x weekly - 1-2 sets - 10 reps - 1sec hold - Sidelying Thoracic Rotation with Open Book  - 2 x daily - 7 x weekly  - 1-2 sets - 10 reps  Patient Education - What Is Pain?       ASSESSMENT:   CLINICAL IMPRESSION: Patient is very attentive and receptive with pain neuroscience concepts and strategies for abating the "alarm" of persistent pain. She has remaining sensitivity of lumbar paraspinals and thoracolumbar hypomobility, but pt tolerates manual therapy very well following use of moist heat. Pt does tolerate exercises in supine/sidelying well today in spite of history of significant difficulty with trunk rotation. Will progress with graded movement as tolerated using pain as guide with upcoming visits (mild to moderate symptoms only). Will further review pain science concepts with upcoming visits to ensure carryover of education and to limit focus purely on pathoanatomy. Patient has remaining deficits in thoracolumbar AROM, decreased thoracic and lumbar spine mobility, decreased hip and LE strength, deceased LE posterior chain flexibility, postural changes, and bilateral lumbar paraspinal and gluteal pain. Patient will benefit from continued skilled therapeutic intervention to address the above deficits as needed for improved function and QoL.     REHAB POTENTIAL: Fair chronic pain with involved interventional history, complicated MSK pain history, comorbiditites   CLINICAL DECISION MAKING: Unstable/unpredictable   EVALUATION COMPLEXITY: Moderate     GOALS:   SHORT TERM GOALS: Target date: 09/17/2022   Pt will be independent with HEP to improve strength and decrease back pain to improve pain-free function at home and work. Baseline: 09/02/22: Baseline HEP to be initiated on visit # 2.  Goal status: INITIAL     LONG TERM GOALS: Target date: 10/16/2022   Pt will increase FOTO to at least 46 to demonstrate significant improvement in function at home and work related to back pain  Baseline: 09/02/22: 30 Goal status: INITIAL   2.  Pt will decrease worst back pain by at least 2 points on the NPRS in  order to demonstrate clinically significant reduction in back pain. Baseline: 09/02/22: 8/10 pain at worst Goal status: INITIAL   3.  Pt will have thoracolumbar AROM at least 75% or greater for all motions without increase in baseline pain as needed for reaching, self-care ADLs, bending to retrive item       Baseline: 09/02/22: Pain and significant motion loss with all directions Goal status: INITIAL   4.  Patient will tolerate standing/ambulatory activity up to 30 minutes without increase in baseline pain as needed for improved ability to complete community outings, cooking/cleaning, and leisure activities with her family Baseline: 09/02/22: Pt tolerates standing up to 10 minutes Goal status: INITIAL   5.  Patient will tolerate sitting up to 1 hour or greater without increase in baseline pain as needed for improved tolerance of leisure activity with her spouse and family and driving/riding in vehicle Baseline: 09/02/22: Severe pain following 1 hour of sitting Goal status: INITIAL     PLAN: PT FREQUENCY: 2x/week   PT DURATION: 6 weeks   PLANNED INTERVENTIONS: Therapeutic exercises, Therapeutic activity, Neuromuscular re-education,  Patient/Family education, Joint mobilization, Aquatic Therapy, Dry Needling, Electrical stimulation, Spinal mobilization, Cryotherapy, Moist heat, and Manual therapy   PLAN FOR NEXT SESSION: Pain neuroscience/persistent pain education, review normal asymptomatic imaging findings. Manual therapy and modalities as needed for pain, graded thoracolumbar and hip movement as tolerated. Utilize aerobic exercise for nervous system down-regulation.    Ysidro Evert  Terance Hart, PT, DPT #[id number]  Eilleen Kempf, PT 09/04/2022, 12:58 PM

## 2022-09-09 ENCOUNTER — Ambulatory Visit: Payer: BC Managed Care – PPO

## 2022-09-09 DIAGNOSIS — M5459 Other low back pain: Secondary | ICD-10-CM | POA: Diagnosis not present

## 2022-09-09 DIAGNOSIS — G894 Chronic pain syndrome: Secondary | ICD-10-CM

## 2022-09-09 DIAGNOSIS — M6281 Muscle weakness (generalized): Secondary | ICD-10-CM

## 2022-09-09 NOTE — Therapy (Signed)
OUTPATIENT PHYSICAL THERAPY TREATMENT NOTE   Patient Name: Donna Cannon MRN: [id number] DOB:11/12/1966, 56 y.o., female Today's Date: 09/09/2022    END OF SESSION:   PT End of Session - 09/09/22 1256     Visit Number 3    Number of Visits 13    Date for PT Re-Evaluation 10/16/22    Authorization Type BCBC 2023, 30 visit limit for PT/OT combined per year    Progress Note Due on Visit 10    PT Start Time 1300    PT Stop Time 1350    PT Time Calculation (min) 50 min    Activity Tolerance Patient limited by pain    Behavior During Therapy WFL for tasks assessed/performed             Past Medical History:  Diagnosis Date   Anxiety    Arthritis    Cancer ([place])    melanoma   GERD (gastroesophageal reflux disease)    Hypertension    Hypothyroidism    Mitral valve prolapse    " mild"   PONV (postoperative nausea and vomiting)    Sacroiliac dysfunction    left   Spinal headache    Wears glasses    Past Surgical History:  Procedure Laterality Date   BACK SURGERY     lumbar decompression   CESAREAN SECTION     COLONOSCOPY W/ BIOPSIES AND POLYPECTOMY     ENDOMETRIAL ABLATION     ESOPHAGOGASTRODUODENOSCOPY (EGD) WITH PROPOFOL N/A 12/12/2016   Procedure: ESOPHAGOGASTRODUODENOSCOPY (EGD) WITH PROPOFOL;  Surgeon: Jonathon Bellows, MD;  Location: [facility] ENDOSCOPY;  Service: Endoscopy;  Laterality: N/A;   TUBAL LIGATION     Patient Active Problem List   Diagnosis Date Noted   Lumbosacral facet arthropathy (Multilevel) (Bilateral) 03/17/2022   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 03/17/2022   Lumbar foraminal stenosis (L4-5) (Bilateral) 03/17/2022   DDD (degenerative disc disease), lumbosacral 03/17/2022   Cervical facet hypertrophy 03/17/2022   DDD (degenerative disc disease), cervical 03/17/2022   Chronic feet pain (5th area of Pain) (Bilateral) 03/17/2022   Chronic wrist pain (7th area of Pain) (Right) 03/17/2022   Chronic low back pain (1ry area of  Pain) (Bilateral) (L>R) w/o sciatica 03/17/2022   Failed back surgical syndrome (2015) 03/17/2022   Chronic lower extremity pain (2ry area of Pain) (Bilateral) (L>R) 03/17/2022   Chronic lower extremity radicular pain (S1 dermatome) (Bilateral) 03/17/2022   Chronic upper extremity pain (3ry area of Pain) (Left) 03/17/2022   Chronic neck pain (4th area of Pain) (Posterior) (Bilateral) (L>R) 03/17/2022   Chronic ankle pain (6th area of Pain) (Bilateral) 03/17/2022   Grade 1 Retrolisthesis of cervical spine (C3/C4 and C5/C6) 03/17/2022   Decreased range of motion of lumbar spine 03/17/2022   Painful cervical range of motion 03/17/2022   Impaired range of motion of cervical spine 03/17/2022   Lumbar facet syndrome 03/17/2022   Chronic hip pain (Bilateral) 03/17/2022   Chronic sacroiliac joint pain (Bilateral) 03/17/2022   Decreased GFR 03/17/2022   Elevated liver enzymes 03/17/2022   Chronic pain syndrome 03/13/2022   Pharmacologic therapy 03/13/2022   Disorder of skeletal system 03/13/2022   Problems influencing health status 03/13/2022   Abnormal MRI, cervical spine (11/18/2021) 03/13/2022   Abnormal MRI, lumbar spine (10/16/2019) 03/13/2022   Radiculopathy of cervical region 11/05/2021   Spinal stenosis of cervical region 11/05/2021   Gouty arthritis of great toe (Right) 06/06/2021   Intervertebral disc disorder with radiculopathy of lumbar region 04/30/2021  Thoracic spine pain 04/30/2021   Osteopenia of multiple sites 03/25/2021   Secondary hyperparathyroidism of renal origin ([place]) 03/25/2021   BMI 32.0-32.9,adult 09/14/2020   Spondylosis of lumbar region without myelopathy or radiculopathy 09/14/2020   Stage 3a chronic kidney disease ([place]) 07/11/2020   Intervertebral disc stenosis of neural canal of cervical region 08/30/2019   SOBOE (shortness of breath on exertion) 06/13/2019   PVC (premature ventricular contraction) 12/29/2018   Chronic low back pain (Bilateral) (L>R) w/  sciatica (Bilateral) (L>R) 03/16/2017   Sciatica 03/16/2017   Gastroesophageal reflux disease 11/17/2016   Gastroesophageal reflux disease with esophagitis 11/13/2016   Benign essential hypertension 09/18/2015   Mixed hyperlipidemia 12/06/2014   Palpitations 12/06/2014   History of melanoma 11/20/2014   Mitral valve prolapse 11/16/2013   Basal cell carcinoma 10/21/2013   History of basal cell cancer 10/21/2013   Acquired hypothyroidism 09/12/2013   Anxiety 09/12/2013   Increased frequency of urination 09/12/2013    PCP: Hortencia Pilar, MD   REFERRING PROVIDER: Milinda Pointer, MD   REFERRING DIAGNOSIS:  M47.816 (ICD-10-CM) - Spondylosis without myelopathy or radiculopathy, lumbar region  M54.50 (ICD-10-CM) - Low back pain, unspecified  G89.29 (ICD-10-CM) - Other chronic pain  M51.37 (ICD-10-CM) - Other intervertebral disc degeneration, lumbosacral region  M96.1 (ICD-10-CM) - Postlaminectomy syndrome, not elsewhere classified  R93.7 (ICD-10-CM) - Abnormal findings on diagnostic imaging of other parts of musculoskeletal system      THERAPY DIAG: Other low back pain   Chronic pain syndrome   Muscle weakness (generalized)   RATIONALE FOR EVALUATION AND TREATMENT: Rehabilitation   ONSET DATE: 10 years ago with progressive worsening   FOLLOW UP APPT WITH PROVIDER: Yes , pt to f/u with Dr. Dossie Arbour next month      PERTINENT HISTORY: Patient is a 56 year old female with primary complaint of low back pain s/p lumbar laminectomy at L4-5 in 2016; pt reports episodes of low back pain even at younger age. Pt has chronic low back pain with episodic flare-ups with involved history of conservative interventions and eventually L4-5 laminectomy in 2016. Pt is s/p 2 medial branch blocks that did help significantly. Patient has referring diagnosis of lumbar facet syndrome. Pt states her condition is degenerative and she feels that it is not going to get better. She reports having difficulty  with walking very far. Pt reports responding well to heat, but having to back down from this due to overuse and getting superficial burns. Pt works sedentary job. No sciatica; hx of ESI that notably improved sciatic-type symptoms. Patient has comorbid neck pain with Hx of C6-7 retrolisthesis. Pt reports disturbed sleep in the night with lying on her R side; she is able to get more comfortable with lying on L side. Pt denies bowel/bladder changes. No sudden weight change. (+) shopping cart sign.   Pain:  Pain Intensity: Present: 6/10, Best: 4/10, Worst: 8/10 Pain location: Varying between R and L lumbar flank Pain Quality: stabbing Radiating: Yes , varying between R and L flank Numbness/Tingling: Yes, in her feet intermittently Focal Weakness: Yes, weakness in her ankles, Hx of several ankle sprains Aggravating factors: twisting of her back, vacuuming/mopping, prolonged sitting, prolonged standing Relieving factors: lying flat  on bed, on the move  24-hour pain behavior: worse in PM How long can you sit: up to 1 hour prior to severe pain  How long can you stand: 10 minutes History of prior back injury, pain, surgery, or therapy: Yes, Hx of lumbar laminectomy in 2016 Falls: Has patient fallen  in last 6 months? 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[employer], desk work mainly [place]: going to [employer], walking out in nature Red flags (bowel/bladder changes, saddle paresthesia, personal history of cancer, h/o spinal tumors, h/o compression fx, h/o abdominal aneurysm, abdominal pain, chills/fever, night sweats, nausea, vomiting, unrelenting pain, first onset of insidious LBP <20 y/o): Positive for skin cancer   Precautions: Osteopenia   Weight Bearing Restrictions: No   Living Environment Lives with: lives with their spouse Lives in:  House/apartment Has following equipment at home: None     Patient Goals: More strength, more stamina for performing activity     OBJECTIVE: (objective measures completed at initial evaluation unless otherwise dated)  Patient Surveys  FOTO 30, predicted score of 67   Cognition Patient is oriented to person, place, and time.  Recent memory is intact.  Remote memory is intact.  Attention span and concentration are intact.  Expressive speech is intact.  Patient's fund of knowledge is within normal limits for educational level.                            Gross Musculoskeletal Assessment Tremor: None Bulk: Normal No visible step-off along spinal column, no signs of scoliosis     GAIT: Unremarkable     Posture: Lumbar lordosis: Decreased Thoracic kyphosis: Increased Forward head, rounded shoulders Lumbar lateral shift: Negative     AROM       AROM (Normal range in degrees) AROM  09/03/2022  Lumbar    Flexion (65) 50%*   Extension (30) <25%*  Right lateral flexion (25) 50%*   Left lateral flexion (25) 50%*   Right rotation (30) 25%*  Left rotation (30) 25%*         Hip Right Left  Flexion (125) WNL* WNL*  Extension (15)      Abduction (40)      Adduction       Internal Rotation (45)      External Rotation (45) WNL WNL         Knee      Flexion (135)      Extension (0)             Ankle      Dorsiflexion (20)      Plantarflexion (50)      Inversion (35)      Eversion (15      (* = pain; Blank rows = not tested)     LE MMT:   MMT (out of 5) Right 09/03/2022 Left 09/03/2022  Hip flexion 4* 4+*  Hip extension      Hip abduction 4* 4*  Hip adduction 5 5  Hip internal rotation      Hip external rotation      Knee flexion 4+ 4+  Knee extension 4+ 4+  Ankle dorsiflexion 5 5  Ankle plantarflexion      Ankle inversion      Ankle eversion      (* = pain; Blank rows = not tested)     Sensation Grossly intact to light touch bilateral LEs as  determined by testing dermatomes L2-S2. Proprioception, and hot/cold testing deferred on this date.     Reflexes R/L Knee Jerk (L3/4): 2+/2+  Ankle Jerk (S1/2): 2+/2+  UMN signs (-)  Muscle Length Hamstrings: R: 20 deg knee extension lacking,  L: Positive for pain prior to tissue restriction; 30 deg knee extension lacking [place] (quadriceps): R: Not examined L: Not examined     Palpation   Location LEFT  RIGHT           Lumbar paraspinals 2 2  Quadratus Lumborum      Gluteus Maximus 1 1  Gluteus Medius 1 2  Deep hip external rotators      PSIS 1 1  Fortin's Area (SIJ) 1 1  Greater Trochanter      (Blank rows = not tested) Graded on 0-4 scale (0 = no pain, 1 = pain, 2 = pain with wincing/grimacing/flinching, 3 = pain with withdrawal, 4 = unwilling to allow palpation), (Blank rows = not tested)     Passive Accessory Intervertebral Motion Reproduction of back pain with CPA L1-S1 and UPA bilaterally L1-L5, pain prior to restriction   SPECIAL TESTS Lumbar Radiculopathy and Discogenic: Centralization and Peripheralization (SN 92, -LR 0.12): Not examined Slump (SN 83, -LR 0.32): R: Negative L: Positive SLR (SN 92, -LR 0.29): R: Positive L:  Positive Crossed SLR (SP 90): R: Not examined L: Negative General lumbar distraction: Negative Compression: Negative    Facet Joint: Extension-Rotation (SN 100, -LR 0.0): R: Not done L: Not done   Lumbar Foraminal Stenosis: Lumbar quadrant (SN 70): R: Positive L: Positive   SIJ:  SI Compression: Positive          TODAY'S TREATMENT   SUBJECTIVE: Patient reports her R great toe is sore- she associated with her gout; her lower back is sore upon arrival- she attributes it to sitting a lot today (she has already worked a full day before coming to PT).  Overall she expresses that improving her mobility and building stamina for walking are important PT goals.  She has a big trip at the end of October to the mountains in [place] and this will  involve some walking.    PAIN:  Are you having pain? Yes: NPRS scale: 6/10 Pain location: Across low back  MHP (unbilled) upon arrival for pain control; along low back in prone x 10 minutes    Manual Therapy - for symptom modulation, soft tissue sensitivity and mobility, joint mobility, ROM  STM/DTM bilateral L1-L5 erector spinae and QL, IASTM with Hypervolt to bilateral lumbar paraspinals and bilateral gluteus medius/maximus CPA gr II-III along T7-T12, gr I-II L3-5   -Pt presents with mid to lower thoracic hypomobility   Therapeutic Exercise - for improved soft tissue flexibility and extensibility as needed for ROM, neurodyanmics to improve neural/dural tissue mobility and reduce pain related to adherent nerve root, graded movement to improve tolerance of accessing thoracolumbar ROM  Double knee to chest, with towel wrapped around knees to assist her hands in pulling; 2x10- performed as single knee to chest today Lower trunk rotations; 2x10 ea dir Open book; 1x10 on each side Supine gentle piriformis stretch/glute stretch 3x ea LE x 20 sec each Seated thoracic extension in chair: 5 seconds x10  Added supine piriformis/glute stretch to HEP today  (Not today) PATIENT EDUCATION: Discussed PNE concepts (utilized video-based format to introduce these concepts for 4:30 min), asymptomatic imaging findings, benefit of aerobic exercise and graded movement to reduce threat with movement; discussed use of walking program with modified duration based on activity tolerance. HEP update and review.   PATIENT EDUCATION:  Education details: Technical brewer, HEP   Person educated: Patient Education method: Explanation Education comprehension:  verbalized understanding      HOME EXERCISE PROGRAM: Access Code: [email or web address] URL: [email or web address] Date: 09/06/2022 Prepared by: Valentina Gu  Exercises - Supine Double Knee to Chest  - 2-3 x daily - 7 x weekly - 1-2  sets - 10 reps - 1sec hold - Supine Lower Trunk Rotation  - 2 x daily - 7 x weekly - 1-2 sets - 10 reps - 1sec hold - Sidelying Thoracic Rotation with Open Book  - 2 x daily - 7 x weekly - 1-2 sets - 10 reps  Patient Education - What Is Pain?   ASSESSMENT:   CLINICAL IMPRESSION: Pt continues to present with thoracic and lumbar spine hypomobility; most significant area today is mid thoracic spine.  She tolerated manual therapy techniques for pain/hypomobility well.  Pt is motivated to participate in therapeutic exercise/graded movement progression; she did note R lumbar paraspinal "spasm" after seated thoracic extension self mobilization technique today.  Sx resolved with position change and lumbar flexion AROM.  Patient will benefit from continued skilled therapeutic intervention to address the above deficits as needed for improved function and QoL.     REHAB POTENTIAL: Fair chronic pain with involved interventional history, complicated MSK pain history, comorbiditites   CLINICAL DECISION MAKING: Unstable/unpredictable   EVALUATION COMPLEXITY: Moderate     GOALS:   SHORT TERM GOALS: Target date: 09/17/2022   Pt will be independent with HEP to improve strength and decrease back pain to improve pain-free function at home and work. Baseline: 09/02/22: Baseline HEP to be initiated on visit # 2.  Goal status: INITIAL     LONG TERM GOALS: Target date: 10/16/2022   Pt will increase FOTO to at least 46 to demonstrate significant improvement in function at home and work related to back pain  Baseline: 09/02/22: 30 Goal status: INITIAL   2.  Pt will decrease worst back pain by at least 2 points on the NPRS in order to demonstrate clinically significant reduction in back pain. Baseline: 09/02/22: 8/10 pain at worst Goal status: INITIAL   3.  Pt will have thoracolumbar AROM at least 75% or greater for all motions without increase in baseline pain as needed for reaching, self-care ADLs, bending  to retrive item       Baseline: 09/02/22: Pain and significant motion loss with all directions Goal status: INITIAL   4.  Patient will tolerate standing/ambulatory activity up to 30 minutes without increase in baseline pain as needed for improved ability to complete community outings, cooking/cleaning, and leisure activities with her family Baseline: 09/02/22: Pt tolerates standing up to 10 minutes Goal status: INITIAL   5.  Patient will tolerate sitting up to 1 hour or greater without increase in baseline pain as needed for improved tolerance of leisure activity with her spouse and family and driving/riding in vehicle Baseline: 09/02/22: Severe pain following 1 hour of sitting Goal status: INITIAL     PLAN: PT FREQUENCY: 2x/week   PT DURATION: 6 weeks   PLANNED INTERVENTIONS: Therapeutic exercises, Therapeutic activity, Neuromuscular re-education,  Patient/Family education, Joint mobilization, Aquatic Therapy, Dry Needling, Electrical stimulation, Spinal mobilization, Cryotherapy, Moist heat, and Manual therapy   PLAN FOR NEXT SESSION: Pain neuroscience/persistent pain education, review normal asymptomatic imaging findings. Manual therapy and modalities as needed for pain, graded thoracolumbar and hip movement as tolerated. Utilize aerobic exercise for nervous system down-regulation.    Merdis Delay, PT, DPT, OCS  #[id number]   Pincus Badder, PT 09/09/2022, 2:00 PM    [facility] [address] [place], [place], [address] Phone: [phone]   Fax:  [phone]  Patient Details  Name: ORAH SONNEN MRN: [id number] Date of Birth: 1966/03/04 Referring Provider:  Milinda Pointer, MD  Encounter Date: 09/09/2022  Pincus Badder, PT 09/09/2022, 2:00 PM  [place] [facility] [address] [place], [place], [address] Phone: [phone]   Fax:  [phone]

## 2022-09-11 ENCOUNTER — Encounter: Payer: Self-pay | Admitting: Physical Therapy

## 2022-09-11 ENCOUNTER — Ambulatory Visit: Payer: BC Managed Care – PPO

## 2022-09-11 DIAGNOSIS — M5459 Other low back pain: Secondary | ICD-10-CM | POA: Diagnosis not present

## 2022-09-11 DIAGNOSIS — M6281 Muscle weakness (generalized): Secondary | ICD-10-CM

## 2022-09-11 DIAGNOSIS — G894 Chronic pain syndrome: Secondary | ICD-10-CM

## 2022-09-11 NOTE — Therapy (Signed)
OUTPATIENT PHYSICAL THERAPY TREATMENT NOTE   Patient Name: Donna Cannon MRN: [id number] DOB:May 04, 1966, 56 y.o., female Today's Date: 09/11/2022    END OF SESSION:   PT End of Session - 09/11/22 1255     Visit Number 4    Number of Visits 13    Date for PT Re-Evaluation 10/16/22    Authorization Type BCBC 2023, 30 visit limit for PT/OT combined per year    Progress Note Due on Visit 10    PT Start Time 1300    PT Stop Time 1348    PT Time Calculation (min) 48 min    Activity Tolerance Patient limited by pain;Patient tolerated treatment well    Behavior During Therapy WFL for tasks assessed/performed             Past Medical History:  Diagnosis Date   Anxiety    Arthritis    Cancer ([place])    melanoma   GERD (gastroesophageal reflux disease)    Hypertension    Hypothyroidism    Mitral valve prolapse    " mild"   PONV (postoperative nausea and vomiting)    Sacroiliac dysfunction    left   Spinal headache    Wears glasses    Past Surgical History:  Procedure Laterality Date   BACK SURGERY     lumbar decompression   CESAREAN SECTION     COLONOSCOPY W/ BIOPSIES AND POLYPECTOMY     ENDOMETRIAL ABLATION     ESOPHAGOGASTRODUODENOSCOPY (EGD) WITH PROPOFOL N/A 12/12/2016   Procedure: ESOPHAGOGASTRODUODENOSCOPY (EGD) WITH PROPOFOL;  Surgeon: Jonathon Bellows, MD;  Location: [facility] ENDOSCOPY;  Service: Endoscopy;  Laterality: N/A;   TUBAL LIGATION     Patient Active Problem List   Diagnosis Date Noted   Lumbosacral facet arthropathy (Multilevel) (Bilateral) 03/17/2022   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 03/17/2022   Lumbar foraminal stenosis (L4-5) (Bilateral) 03/17/2022   DDD (degenerative disc disease), lumbosacral 03/17/2022   Cervical facet hypertrophy 03/17/2022   DDD (degenerative disc disease), cervical 03/17/2022   Chronic feet pain (5th area of Pain) (Bilateral) 03/17/2022   Chronic wrist pain (7th area of Pain) (Right) 03/17/2022    Chronic low back pain (1ry area of Pain) (Bilateral) (L>R) w/o sciatica 03/17/2022   Failed back surgical syndrome (2015) 03/17/2022   Chronic lower extremity pain (2ry area of Pain) (Bilateral) (L>R) 03/17/2022   Chronic lower extremity radicular pain (S1 dermatome) (Bilateral) 03/17/2022   Chronic upper extremity pain (3ry area of Pain) (Left) 03/17/2022   Chronic neck pain (4th area of Pain) (Posterior) (Bilateral) (L>R) 03/17/2022   Chronic ankle pain (6th area of Pain) (Bilateral) 03/17/2022   Grade 1 Retrolisthesis of cervical spine (C3/C4 and C5/C6) 03/17/2022   Decreased range of motion of lumbar spine 03/17/2022   Painful cervical range of motion 03/17/2022   Impaired range of motion of cervical spine 03/17/2022   Lumbar facet syndrome 03/17/2022   Chronic hip pain (Bilateral) 03/17/2022   Chronic sacroiliac joint pain (Bilateral) 03/17/2022   Decreased GFR 03/17/2022   Elevated liver enzymes 03/17/2022   Chronic pain syndrome 03/13/2022   Pharmacologic therapy 03/13/2022   Disorder of skeletal system 03/13/2022   Problems influencing health status 03/13/2022   Abnormal MRI, cervical spine (11/18/2021) 03/13/2022   Abnormal MRI, lumbar spine (10/16/2019) 03/13/2022   Radiculopathy of cervical region 11/05/2021   Spinal stenosis of cervical region 11/05/2021   Gouty arthritis of great toe (Right) 06/06/2021   Intervertebral disc disorder with radiculopathy of lumbar region  04/30/2021   Thoracic spine pain 04/30/2021   Osteopenia of multiple sites 03/25/2021   Secondary hyperparathyroidism of renal origin ([place]) 03/25/2021   BMI 32.0-32.9,adult 09/14/2020   Spondylosis of lumbar region without myelopathy or radiculopathy 09/14/2020   Stage 3a chronic kidney disease ([place]) 07/11/2020   Intervertebral disc stenosis of neural canal of cervical region 08/30/2019   SOBOE (shortness of breath on exertion) 06/13/2019   PVC (premature ventricular contraction) 12/29/2018   Chronic low  back pain (Bilateral) (L>R) w/ sciatica (Bilateral) (L>R) 03/16/2017   Sciatica 03/16/2017   Gastroesophageal reflux disease 11/17/2016   Gastroesophageal reflux disease with esophagitis 11/13/2016   Benign essential hypertension 09/18/2015   Mixed hyperlipidemia 12/06/2014   Palpitations 12/06/2014   History of melanoma 11/20/2014   Mitral valve prolapse 11/16/2013   Basal cell carcinoma 10/21/2013   History of basal cell cancer 10/21/2013   Acquired hypothyroidism 09/12/2013   Anxiety 09/12/2013   Increased frequency of urination 09/12/2013    PCP: Hortencia Pilar, MD   REFERRING PROVIDER: Milinda Pointer, MD   REFERRING DIAGNOSIS:  M47.816 (ICD-10-CM) - Spondylosis without myelopathy or radiculopathy, lumbar region  M54.50 (ICD-10-CM) - Low back pain, unspecified  G89.29 (ICD-10-CM) - Other chronic pain  M51.37 (ICD-10-CM) - Other intervertebral disc degeneration, lumbosacral region  M96.1 (ICD-10-CM) - Postlaminectomy syndrome, not elsewhere classified  R93.7 (ICD-10-CM) - Abnormal findings on diagnostic imaging of other parts of musculoskeletal system      THERAPY DIAG: Other low back pain   Chronic pain syndrome   Muscle weakness (generalized)   RATIONALE FOR EVALUATION AND TREATMENT: Rehabilitation   ONSET DATE: 10 years ago with progressive worsening   FOLLOW UP APPT WITH PROVIDER: Yes , pt to f/u with Dr. Dossie Arbour next month      PERTINENT HISTORY: Patient is a 56 year old female with primary complaint of low back pain s/p lumbar laminectomy at L4-5 in 2016; pt reports episodes of low back pain even at younger age. Pt has chronic low back pain with episodic flare-ups with involved history of conservative interventions and eventually L4-5 laminectomy in 2016. Pt is s/p 2 medial branch blocks that did help significantly. Patient has referring diagnosis of lumbar facet syndrome. Pt states her condition is degenerative and she feels that it is not going to get better.  She reports having difficulty with walking very far. Pt reports responding well to heat, but having to back down from this due to overuse and getting superficial burns. Pt works sedentary job. No sciatica; hx of ESI that notably improved sciatic-type symptoms. Patient has comorbid neck pain with Hx of C6-7 retrolisthesis. Pt reports disturbed sleep in the night with lying on her R side; she is able to get more comfortable with lying on L side. Pt denies bowel/bladder changes. No sudden weight change. (+) shopping cart sign.   Pain:  Pain Intensity: Present: 6/10, Best: 4/10, Worst: 8/10 Pain location: Varying between R and L lumbar flank Pain Quality: stabbing Radiating: Yes , varying between R and L flank Numbness/Tingling: Yes, in her feet intermittently Focal Weakness: Yes, weakness in her ankles, Hx of several ankle sprains Aggravating factors: twisting of her back, vacuuming/mopping, prolonged sitting, prolonged standing Relieving factors: lying flat  on bed, on the move  24-hour pain behavior: worse in PM How long can you sit: up to 1 hour prior to severe pain  How long can you stand: 10 minutes History of prior back injury, pain, surgery, or therapy: Yes, Hx of lumbar laminectomy in 2016 Falls:  Has patient fallen in last 6 months? 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[employer], desk work mainly [place]: going to [employer], walking out in nature Red flags (bowel/bladder changes, saddle paresthesia, personal history of cancer, h/o spinal tumors, h/o compression fx, h/o abdominal aneurysm, abdominal pain, chills/fever, night sweats, nausea, vomiting, unrelenting pain, first onset of insidious LBP <20 y/o): Positive for skin cancer   Precautions: Osteopenia   Weight Bearing Restrictions: No   Living Environment Lives with: lives with their  spouse Lives in: House/apartment Has following equipment at home: None     Patient Goals: More strength, more stamina for performing activity     OBJECTIVE: (objective measures completed at initial evaluation unless otherwise dated)  Patient Surveys  FOTO 30, predicted score of 46   Cognition Patient is oriented to person, place, and time.  Recent memory is intact.  Remote memory is intact.  Attention span and concentration are intact.  Expressive speech is intact.  Patient's fund of knowledge is within normal limits for educational level.                            Gross Musculoskeletal Assessment Tremor: None Bulk: Normal No visible step-off along spinal column, no signs of scoliosis     GAIT: Unremarkable     Posture: Lumbar lordosis: Decreased Thoracic kyphosis: Increased Forward head, rounded shoulders Lumbar lateral shift: Negative     AROM       AROM (Normal range in degrees) AROM  09/03/2022  Lumbar    Flexion (65) 50%*   Extension (30) <25%*  Right lateral flexion (25) 50%*   Left lateral flexion (25) 50%*   Right rotation (30) 25%*  Left rotation (30) 25%*         Hip Right Left  Flexion (125) WNL* WNL*  Extension (15)      Abduction (40)      Adduction       Internal Rotation (45)      External Rotation (45) WNL WNL         Knee      Flexion (135)      Extension (0)             Ankle      Dorsiflexion (20)      Plantarflexion (50)      Inversion (35)      Eversion (15      (* = pain; Blank rows = not tested)     LE MMT:   MMT (out of 5) Right 09/03/2022 Left 09/03/2022  Hip flexion 4* 4+*  Hip extension      Hip abduction 4* 4*  Hip adduction 5 5  Hip internal rotation      Hip external rotation      Knee flexion 4+ 4+  Knee extension 4+ 4+  Ankle dorsiflexion 5 5  Ankle plantarflexion      Ankle inversion      Ankle eversion      (* = pain; Blank rows = not tested)     Sensation Grossly intact to light touch  bilateral LEs as determined by testing dermatomes L2-S2. Proprioception, and hot/cold testing deferred on this date.     Reflexes R/L Knee Jerk (L3/4): 2+/2+  Ankle Jerk (S1/2): 2+/2+  UMN signs (-)  Muscle Length Hamstrings: R: 20 deg knee extension lacking,  L: Positive for pain prior to tissue restriction; 30 deg knee extension lacking [place] (quadriceps): R: Not examined L: Not examined     Palpation   Location LEFT  RIGHT           Lumbar paraspinals 2 2  Quadratus Lumborum      Gluteus Maximus 1 1  Gluteus Medius 1 2  Deep hip external rotators      PSIS 1 1  Fortin's Area (SIJ) 1 1  Greater Trochanter      (Blank rows = not tested) Graded on 0-4 scale (0 = no pain, 1 = pain, 2 = pain with wincing/grimacing/flinching, 3 = pain with withdrawal, 4 = unwilling to allow palpation), (Blank rows = not tested)     Passive Accessory Intervertebral Motion Reproduction of back pain with CPA L1-S1 and UPA bilaterally L1-L5, pain prior to restriction   SPECIAL TESTS Lumbar Radiculopathy and Discogenic: Centralization and Peripheralization (SN 92, -LR 0.12): Not examined Slump (SN 83, -LR 0.32): R: Negative L: Positive SLR (SN 92, -LR 0.29): R: Positive L:  Positive Crossed SLR (SP 90): R: Not examined L: Negative General lumbar distraction: Negative Compression: Negative    Facet Joint: Extension-Rotation (SN 100, -LR 0.0): R: Not done L: Not done   Lumbar Foraminal Stenosis: Lumbar quadrant (SN 70): R: Positive L: Positive   SIJ:  SI Compression: Positive          TODAY'S TREATMENT  SUBJECTIVE: Pt reports her pain remains 6/10 NPS. Unsure if her pain is going to improve overall. Reports no relief from PT so far.   PAIN:  Are you having pain? Yes: NPRS scale: 6/10 Pain location: Across low back, mainly R side today  MHP (unbilled) upon arrival for pain control; along low back in prone x 8 minutes. Discussion on POC going forward, plan if PT Is unsuccessful.  Education on pain science, graded physical activity.        Manual Therapy - for symptom modulation, soft tissue sensitivity and mobility, joint mobility, ROM. 8 minutes total with patient in prone.    IASTM with Hypervolt to bilateral lumbar paraspinals for 5 minutes to improve tissues extensibility, CNS pain sensitization.   CPA grade 2 T7-T12, 2x5sec bouts per segment. Progressed to grade 3 T7-T12 2x5 sec bouts per segment.  CPA grade 2 L5-T12 to decrease LBP. 10 sec bouts/segment      Therapeutic Exercise - for improved soft tissue flexibility and extensibility as needed for ROM, neurodyanmics to improve neural/dural tissue mobility and reduce pain related to adherent nerve root, graded movement to improve tolerance of accessing thoracolumbar ROM    Posterior pelvic tilts: 2x12. Min TC's on lumbar spine. Good carryover.     Gentle glut bridge + posterior tilt: x3. Onset of LBP so discontinued. Regressed to posterior tilt + glut max isometric. X10.    Standing scap retractions at [place]: 1x6, 30#. 2x6, 20#. Decreased weight due to pain.     Seated lat pull down at [place]: 3x6, 30#.    Standing D2 with YTB: x8/UE. VC's to limit thoracic rotation.      Pt overall required min to mod multimodal cuing for form/technique to maintain neutral spine positioning and sequencing of UE's with resistance exercise. Good carryover after PT demo and cues. Education provided on DOMS and expectations post resistance exercise. Educated pt to update primary PT on how today's session went with LBP symptom response over the  weekend.     PATIENT EDUCATION:  Education details: Technical brewer, HEP   Person educated: Patient Education method: Explanation Education comprehension: verbalized understanding      HOME EXERCISE PROGRAM: Access Code: [email or web address] URL: [email or web address] Date: 09/06/2022 Prepared by: Valentina Gu  Exercises - Supine Double Knee to  Chest  - 2-3 x daily - 7 x weekly - 1-2 sets - 10 reps - 1sec hold - Supine Lower Trunk Rotation  - 2 x daily - 7 x weekly - 1-2 sets - 10 reps - 1sec hold - Sidelying Thoracic Rotation with Open Book  - 2 x daily - 7 x weekly - 1-2 sets - 10 reps  Patient Education - What Is Pain?   ASSESSMENT:   CLINICAL IMPRESSION: Session focused on gentle mobility and resistance strengthening this date to trial alternative exercises to attempt to improve pain in lower back. Pt overall tolerated session well with minor increases in pain with hip extension and spine rotation exercises. Able to adapt/modify exercises with reduced resistance and decreased ranges of motion with return to baseline pain levels. Pt educated on DOMS versus exacerbation of pain and to update primary PT at following session of response to today's treatment. Pt will continue to benefit from skilled PT services to continue to address deficits and pain to improve functional mobility and QoL.    REHAB POTENTIAL: Fair chronic pain with involved interventional history, complicated MSK pain history, comorbiditites   CLINICAL DECISION MAKING: Unstable/unpredictable   EVALUATION COMPLEXITY: Moderate     GOALS:   SHORT TERM GOALS: Target date: 09/17/2022   Pt will be independent with HEP to improve strength and decrease back pain to improve pain-free function at home and work. Baseline: 09/02/22: Baseline HEP to be initiated on visit # 2.  Goal status: INITIAL     LONG TERM GOALS: Target date: 10/16/2022   Pt will increase FOTO to at least 46 to demonstrate significant improvement in function at home and work related to back pain  Baseline: 09/02/22: 30 Goal status: INITIAL   2.  Pt will decrease worst back pain by at least 2 points on the NPRS in order to demonstrate clinically significant reduction in back pain. Baseline: 09/02/22: 8/10 pain at worst Goal status: INITIAL   3.  Pt will have thoracolumbar AROM at least 75% or  greater for all motions without increase in baseline pain as needed for reaching, self-care ADLs, bending to retrive item       Baseline: 09/02/22: Pain and significant motion loss with all directions Goal status: INITIAL   4.  Patient will tolerate standing/ambulatory activity up to 30 minutes without increase in baseline pain as needed for improved ability to complete community outings, cooking/cleaning, and leisure activities with her family Baseline: 09/02/22: Pt tolerates standing up to 10 minutes Goal status: INITIAL   5.  Patient will tolerate sitting up to 1 hour or greater without increase in baseline pain as needed for improved tolerance of leisure activity with her spouse and family and driving/riding in vehicle Baseline: 09/02/22: Severe pain following 1 hour of sitting Goal status: INITIAL     PLAN: PT FREQUENCY: 2x/week   PT DURATION: 6 weeks   PLANNED INTERVENTIONS: Therapeutic exercises, Therapeutic activity, Neuromuscular re-education,  Patient/Family education, Joint mobilization, Aquatic Therapy, Dry Needling, Electrical stimulation, Spinal mobilization, Cryotherapy, Moist heat, and Manual therapy   PLAN FOR NEXT SESSION: Pain neuroscience/persistent pain education, review normal asymptomatic imaging findings. Manual therapy and modalities as needed for pain, graded  thoracolumbar and hip movement as tolerated. Utilize aerobic exercise for nervous system down-regulation.    Salem Caster. Fairly IV, PT, DPT Physical Therapist- [facility]  09/11/2022, 2:09 PM

## 2022-09-16 ENCOUNTER — Encounter: Payer: BC Managed Care – PPO | Admitting: Physical Therapy

## 2022-09-16 IMAGING — CR DG CERVICAL SPINE COMP WITH FLEX & EXTEND
1 series · 7 of 7 positions shown · non-contrast
Comparison: None.

CLINICAL DATA: Cervicalgia

EXAM:
CERVICAL SPINE COMPLETE WITH FLEXION AND EXTENSION VIEWS

[Series 1: dg cervical spine with flex & extend · 0.14mm/px · 7 of 7 slices shown]
[im 1/7]
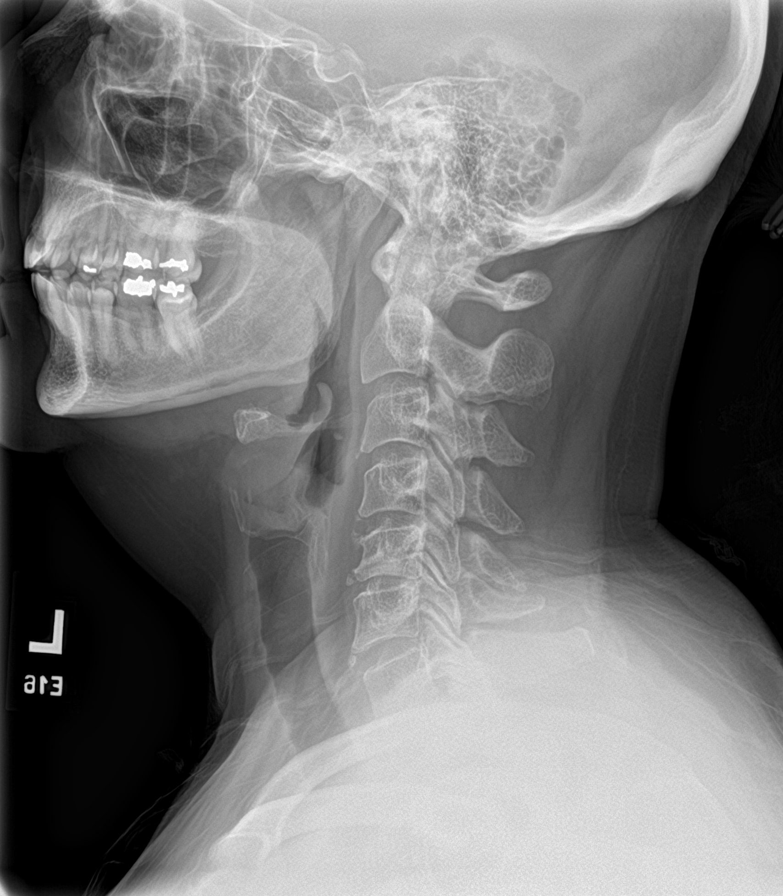
[im 2/7]
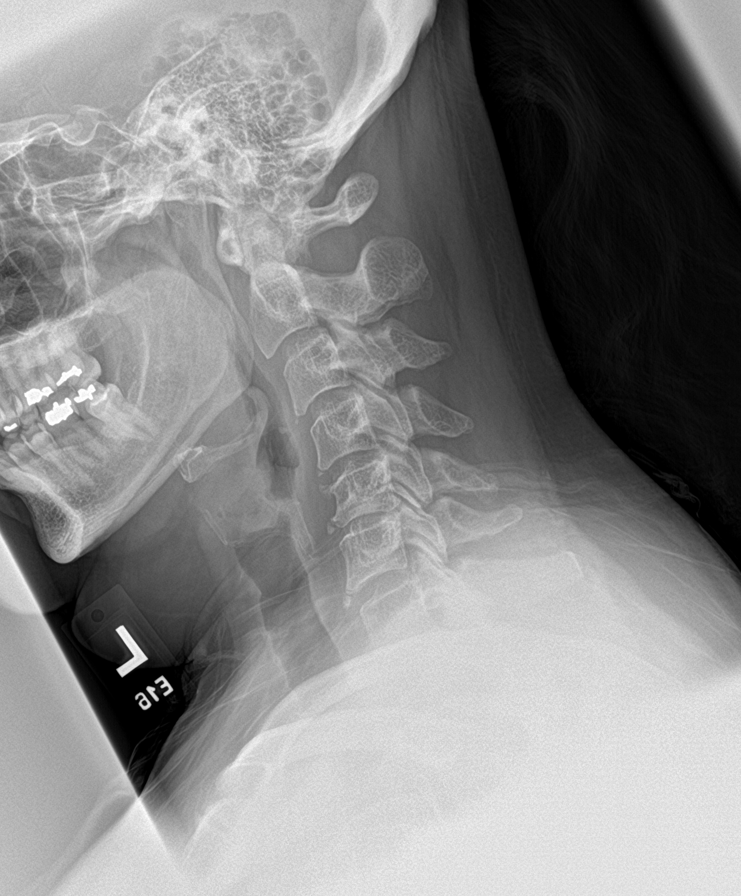
[im 3/7]
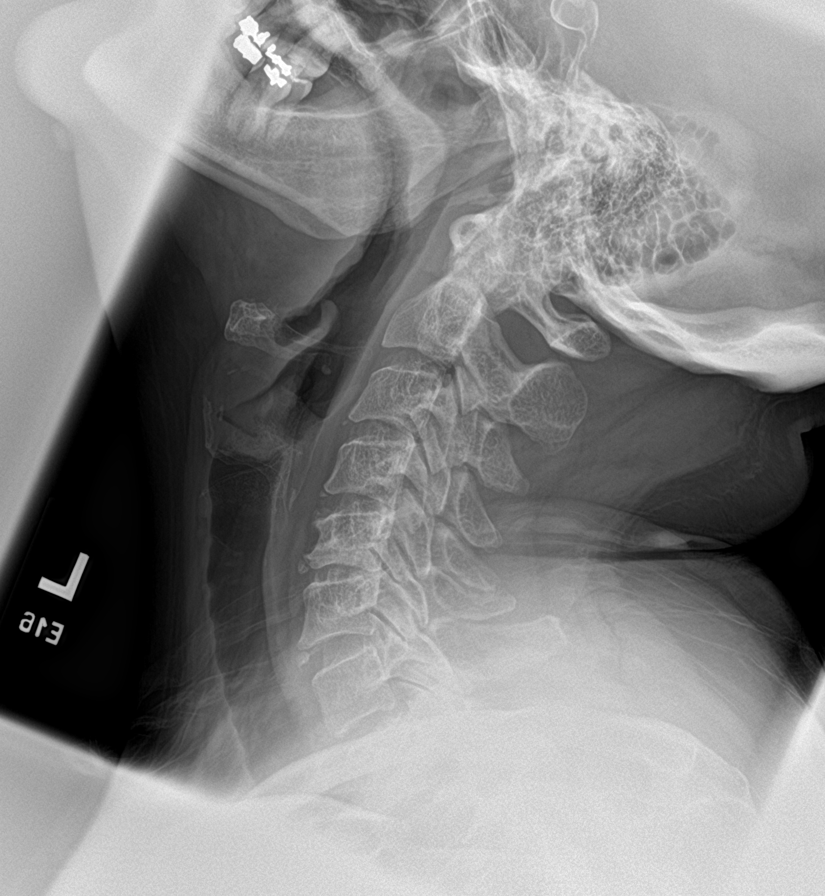
[im 4/7]
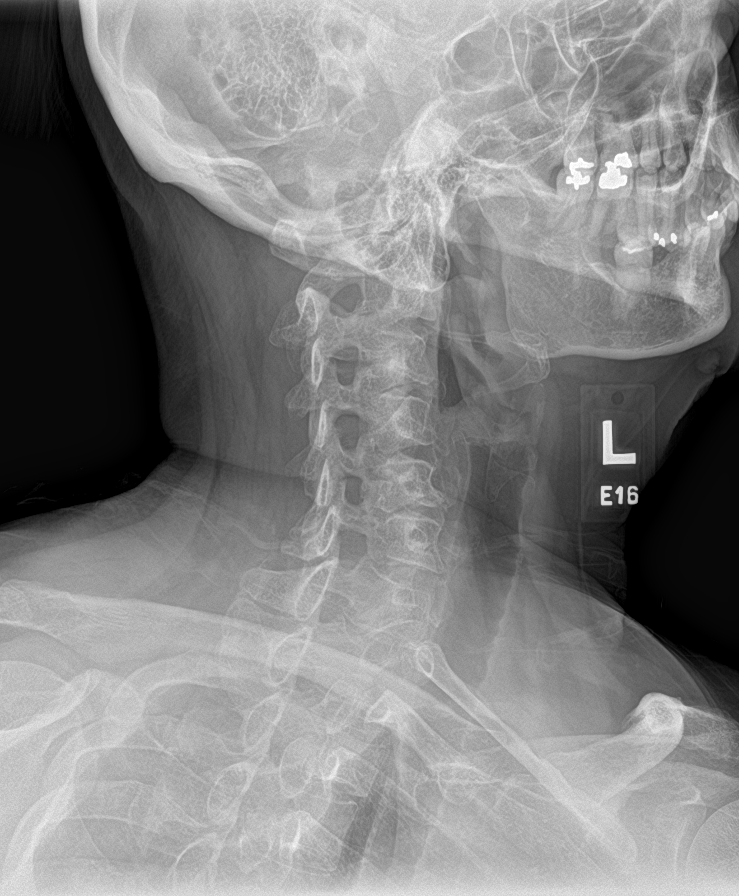
[im 5/7]
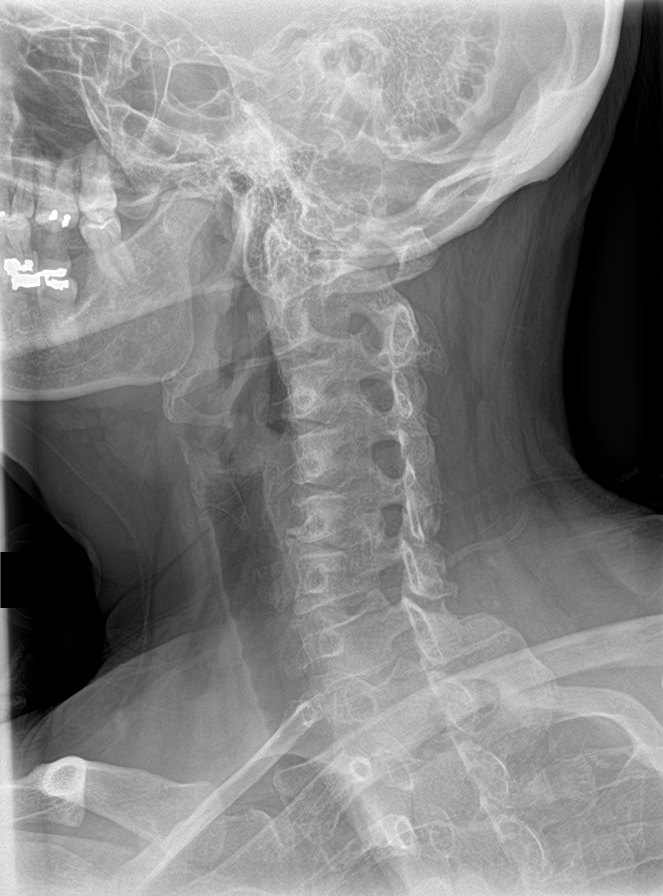
[im 6/7]
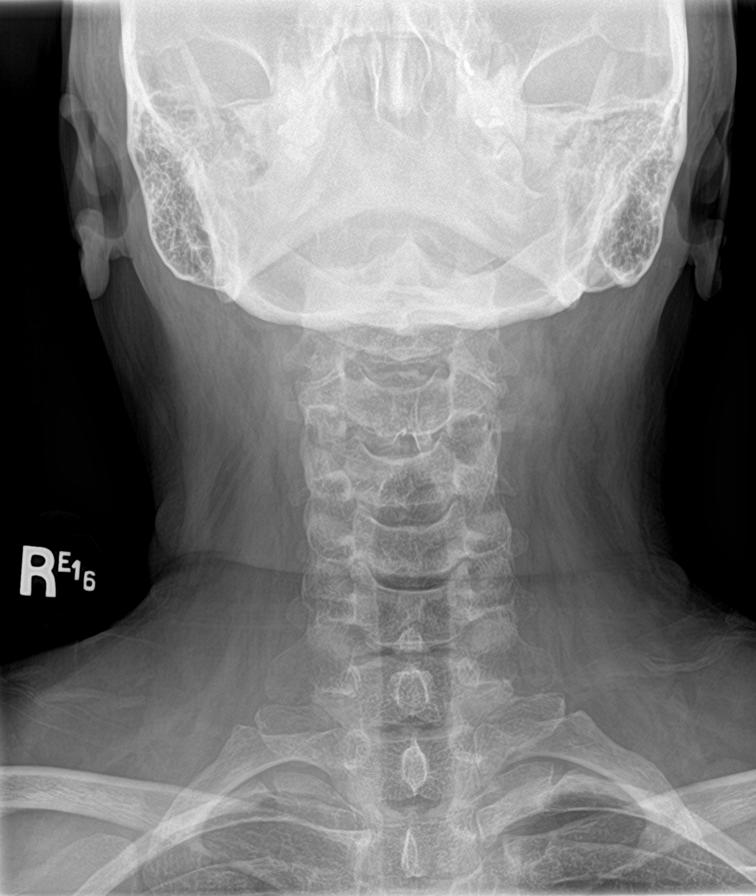
[im 7/7]
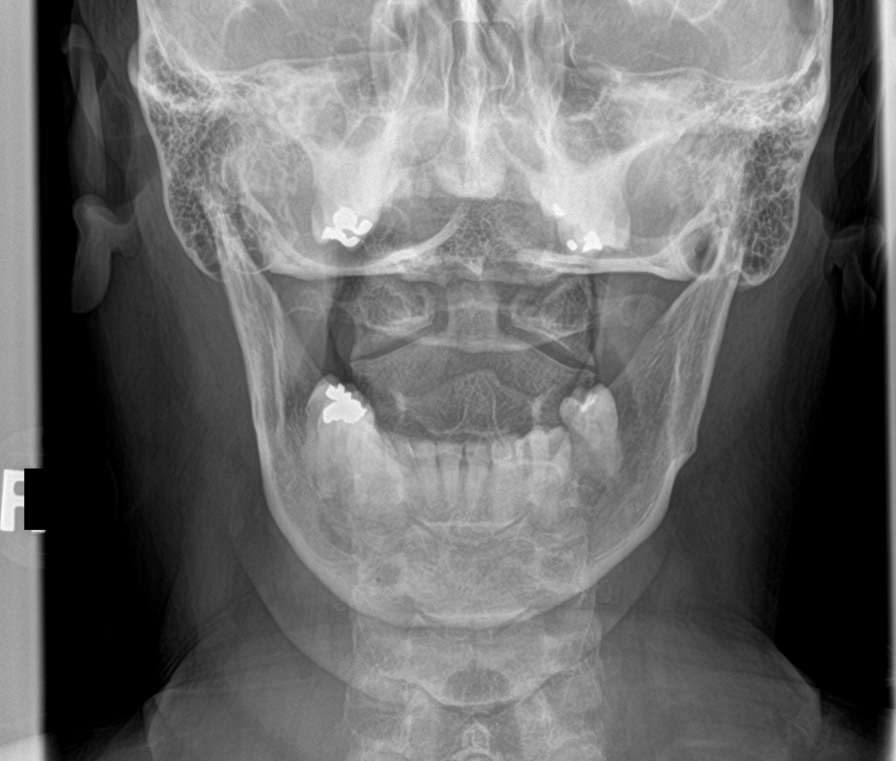

[7 of 7 positions shown; findings below may reference images not displayed]

FINDINGS: Mild straightening of the cervical spine. No acute fracture or
listhesis of the cervical spine. Vertebral body height is preserved.
Intervertebral disc space narrowing and endplate remodeling of
C4-C7, most severe at C5-C6 is present in keeping with changes of
mild degenerative disc disease. The prevertebral soft tissues are
not thickened. Spinal canal is widely patent. Oblique views
demonstrate mild bilateral neuroforaminal narrowing at C5-6
secondary to uncovertebral arthrosis. Flexion extension views
demonstrate no abnormal angulatory or translatory motion.
IMPRESSION: Mild degenerative disc disease and degenerative joint disease, as
described above, with mild bilateral neuroforaminal narrowing at
C5-6.

## 2022-09-16 IMAGING — CR DG FOOT COMPLETE 3+V*L*
1 series · 3 of 3 positions shown · non-contrast
Comparison: None.

CLINICAL DATA: Left foot pain

EXAM:
LEFT FOOT - COMPLETE 3+ VIEW

[Series 1: dg foot complete left · 0.14mm/px · 3 of 3 slices shown]
[im 1/3]
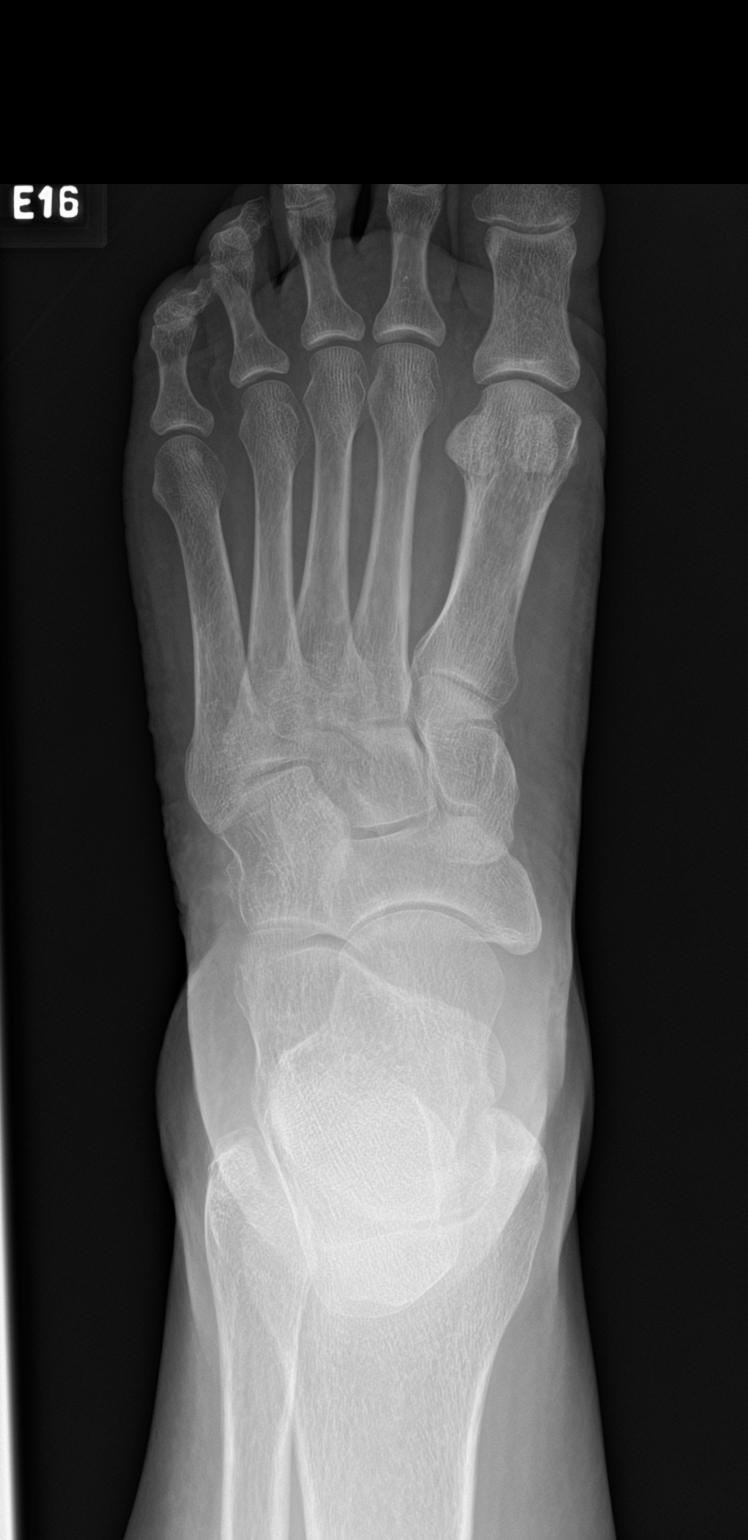
[im 2/3]
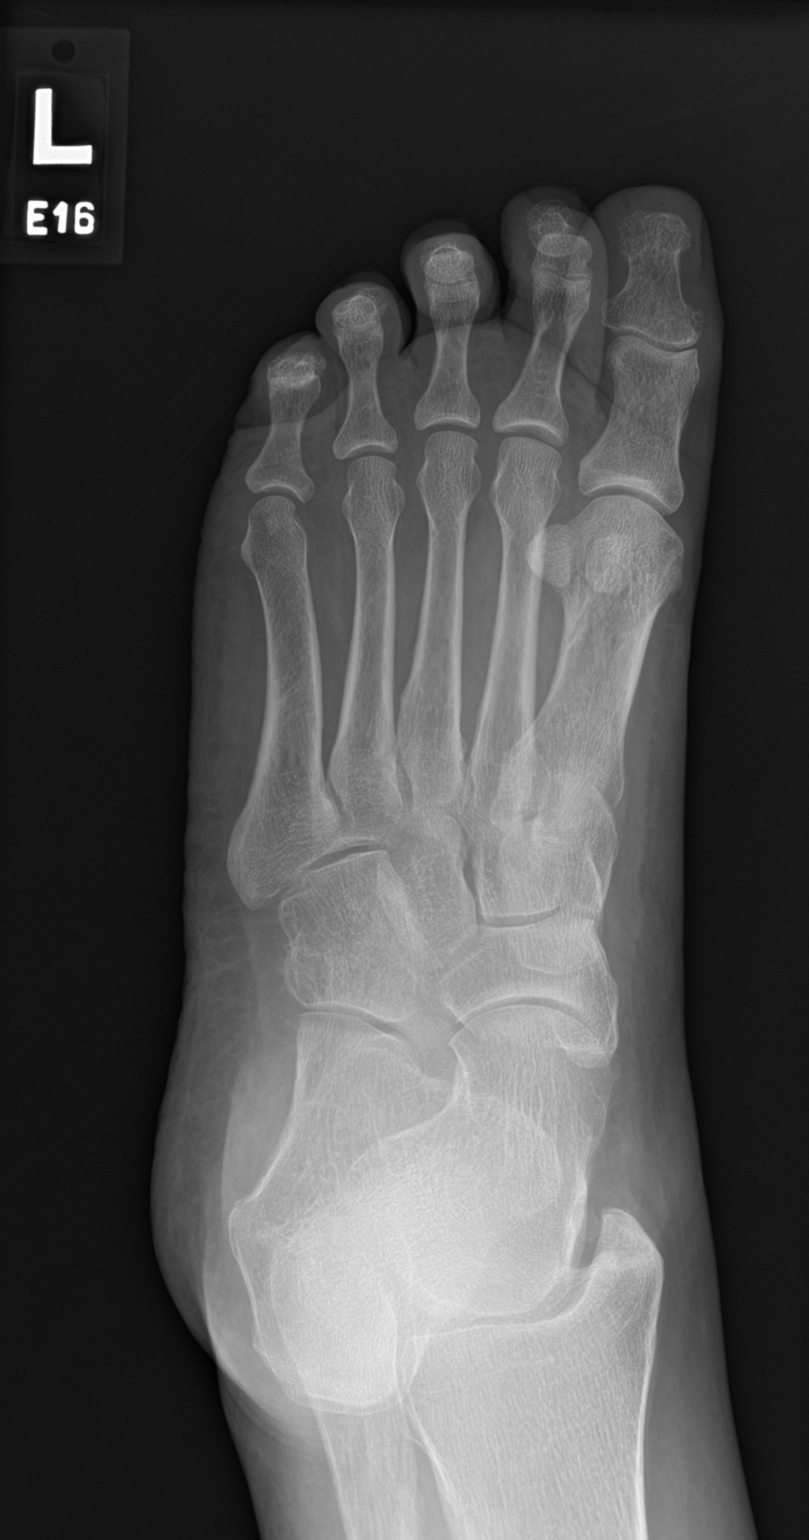
[im 3/3]
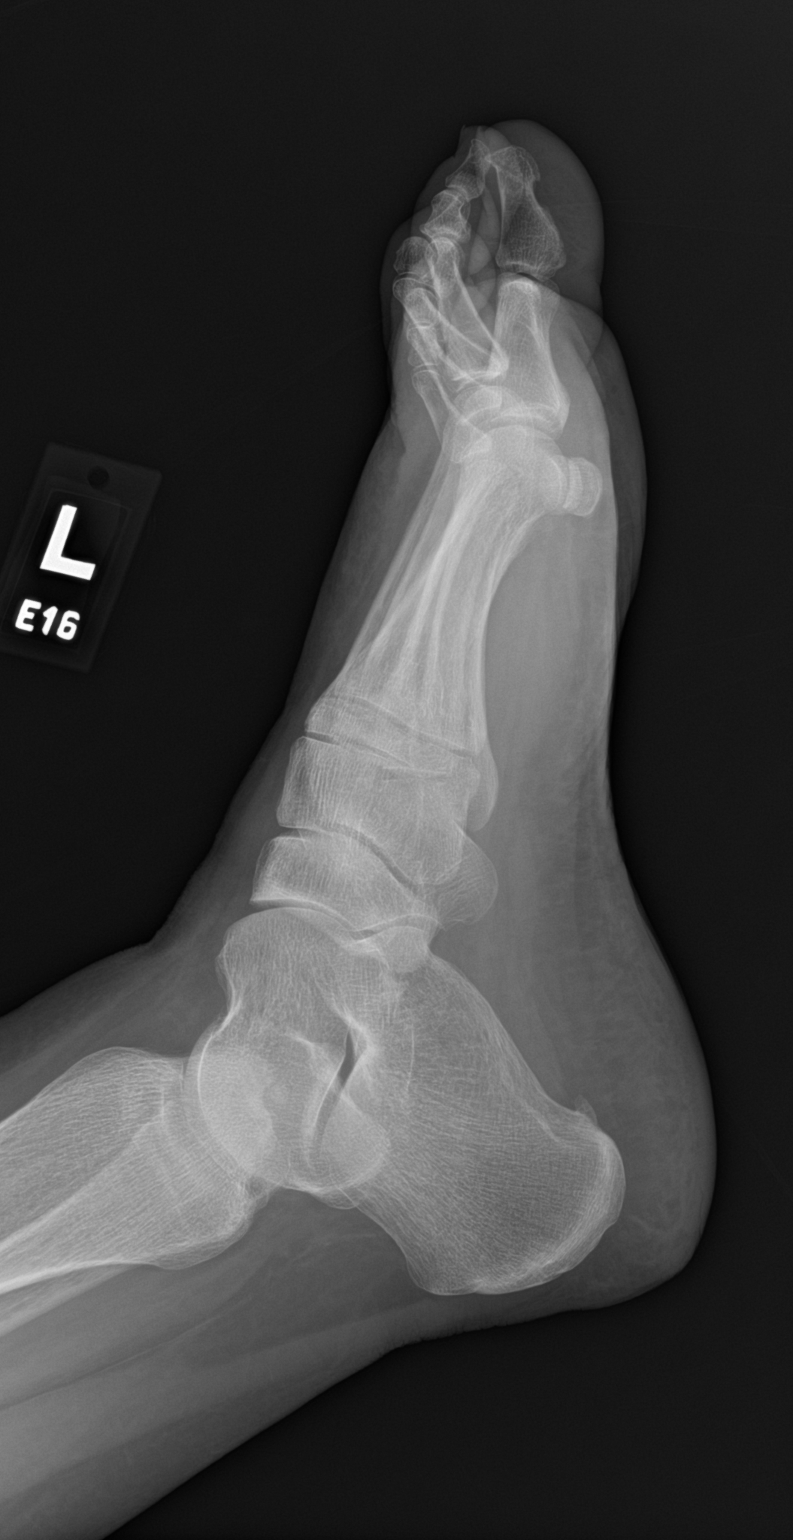

[3 of 3 positions shown; findings below may reference images not displayed]

FINDINGS: There is no evidence of fracture or dislocation. There is no
evidence of arthropathy or other focal bone abnormality. Soft
tissues are unremarkable.
IMPRESSION: Negative.

## 2022-09-16 IMAGING — CR DG LUMBAR SPINE COMPLETE W/ BEND
1 series · 7 of 7 positions shown · non-contrast
Comparison: None.

CLINICAL DATA: Low back pain

EXAM:
LUMBAR SPINE - COMPLETE WITH BENDING VIEWS

[Series 1: dg lumbar spine complete w/bend 6+v · 0.14mm/px · 7 of 7 slices shown]
[im 1/7]
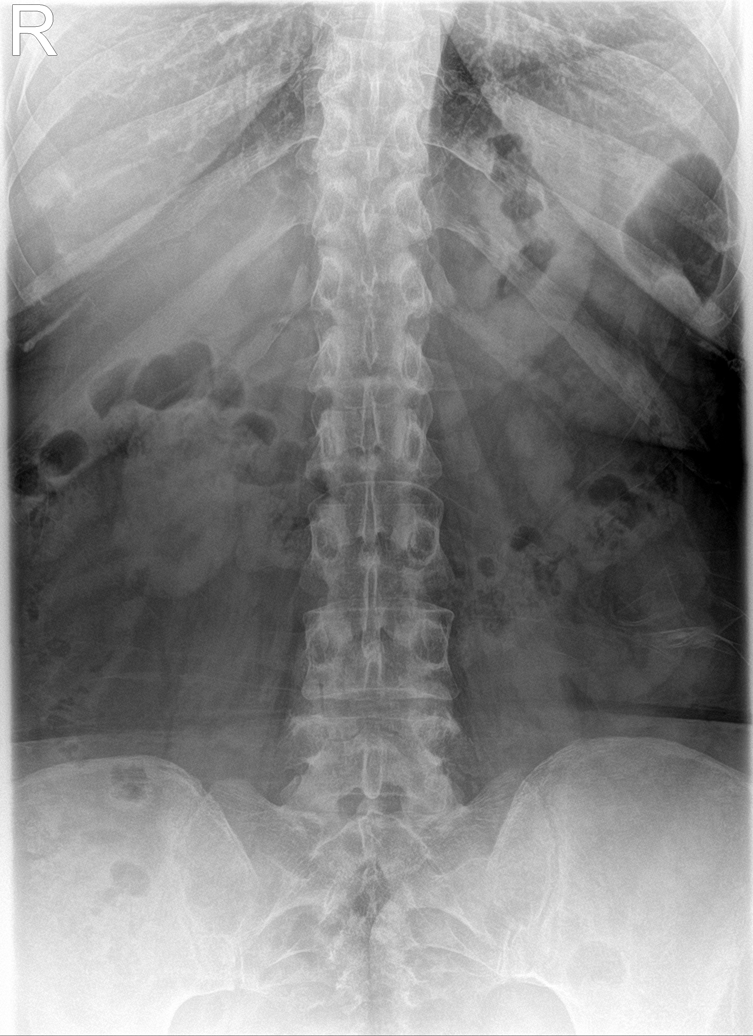
[im 2/7]
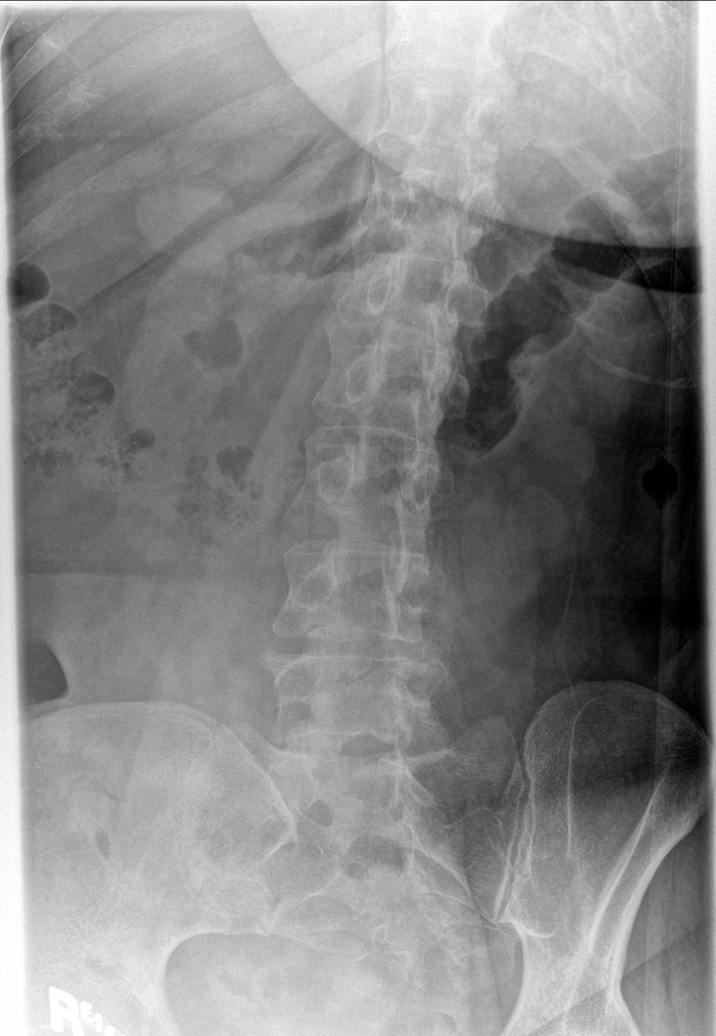
[im 3/7]
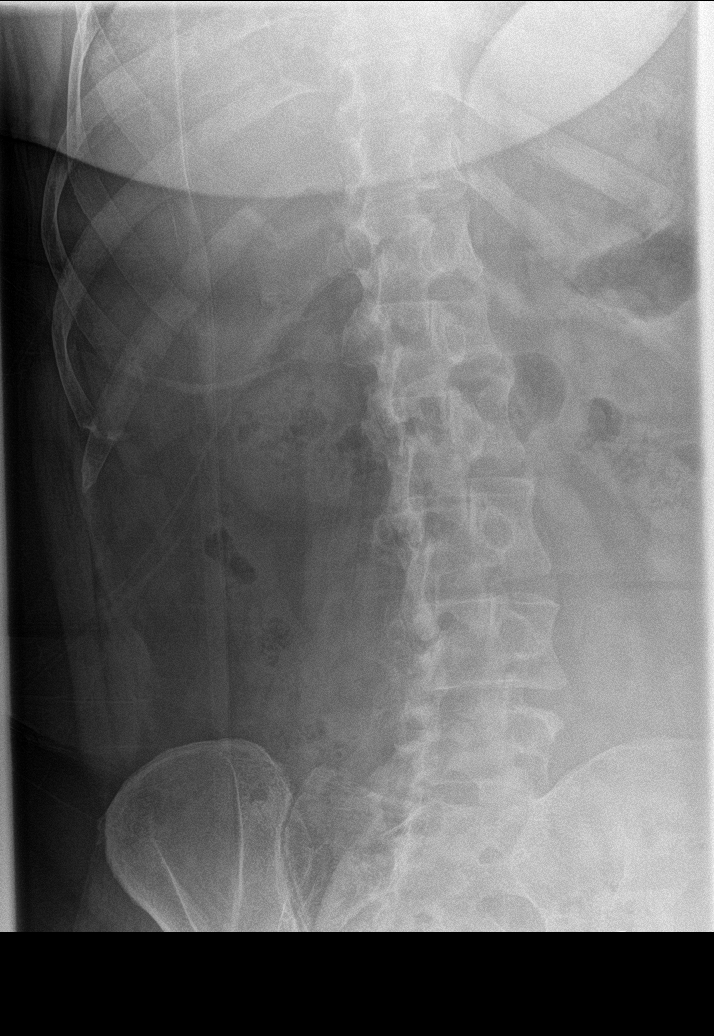
[im 4/7]
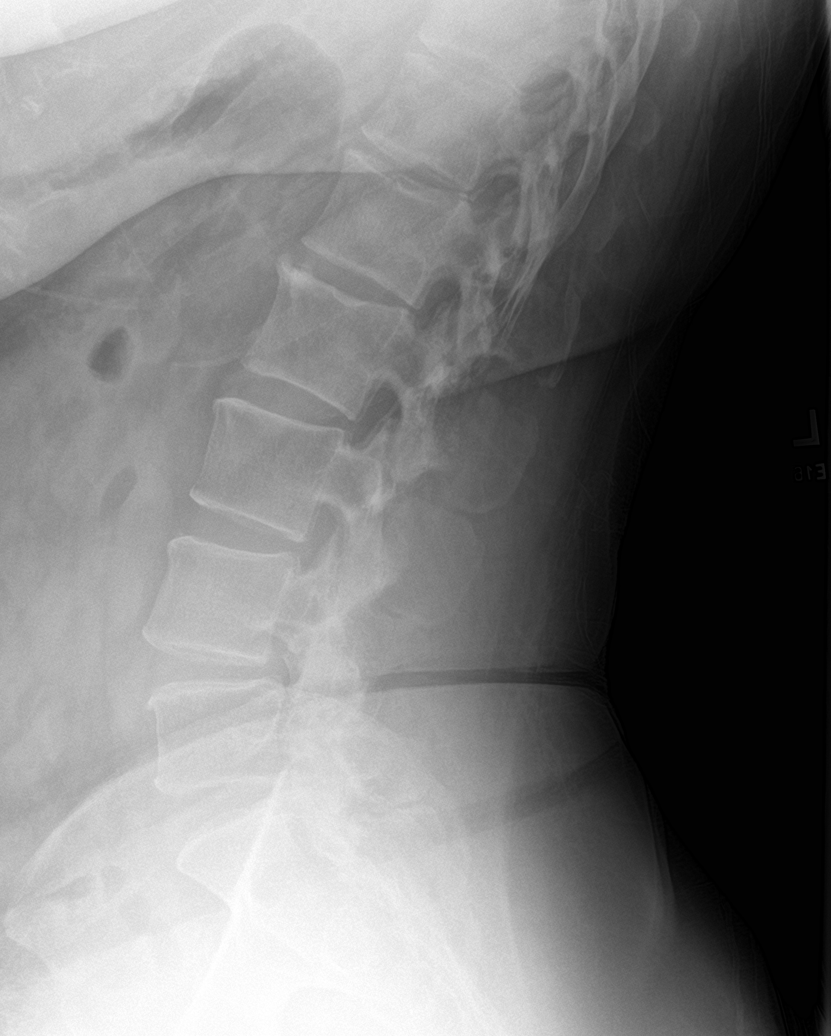
[im 5/7]
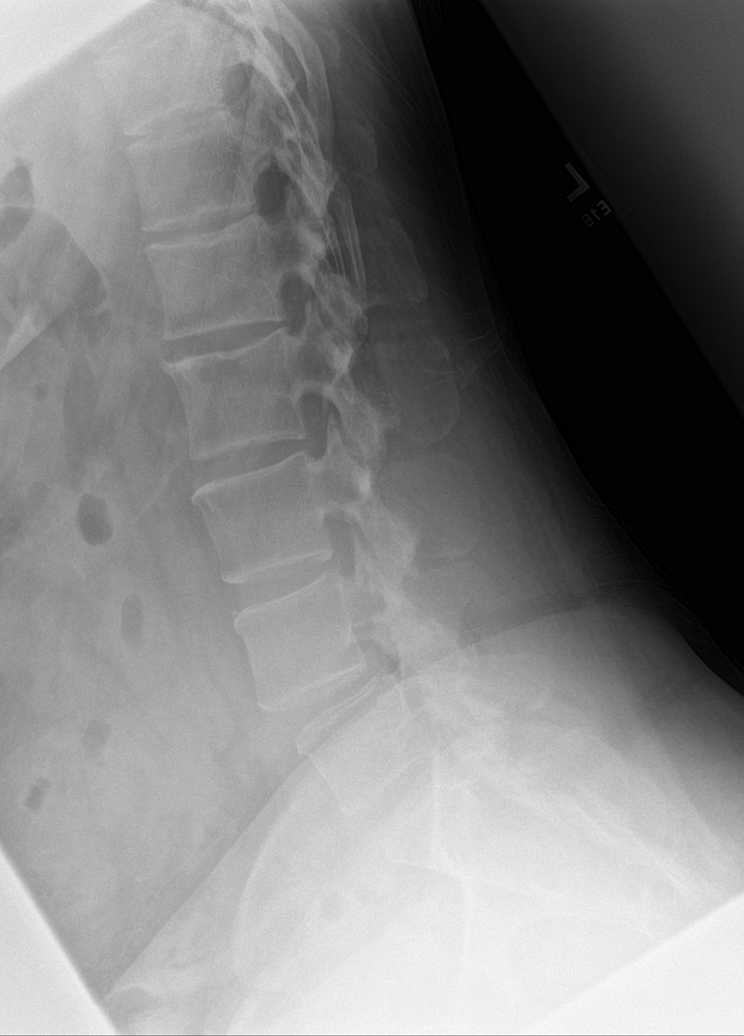
[im 6/7]
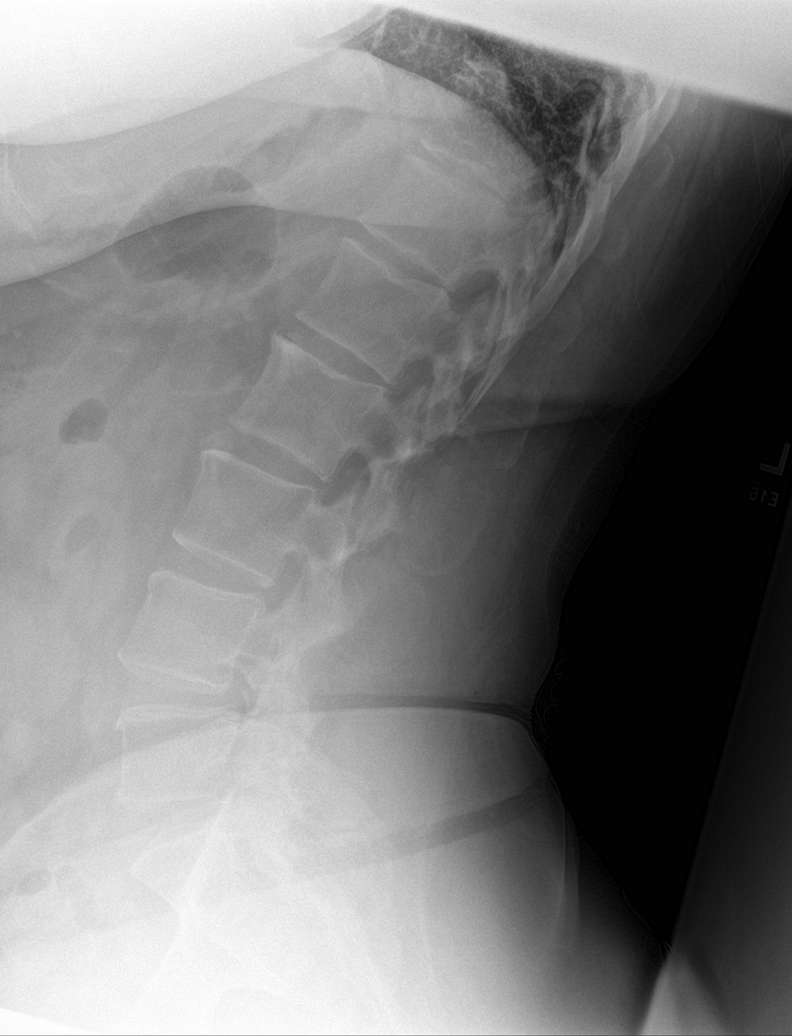
[im 7/7]
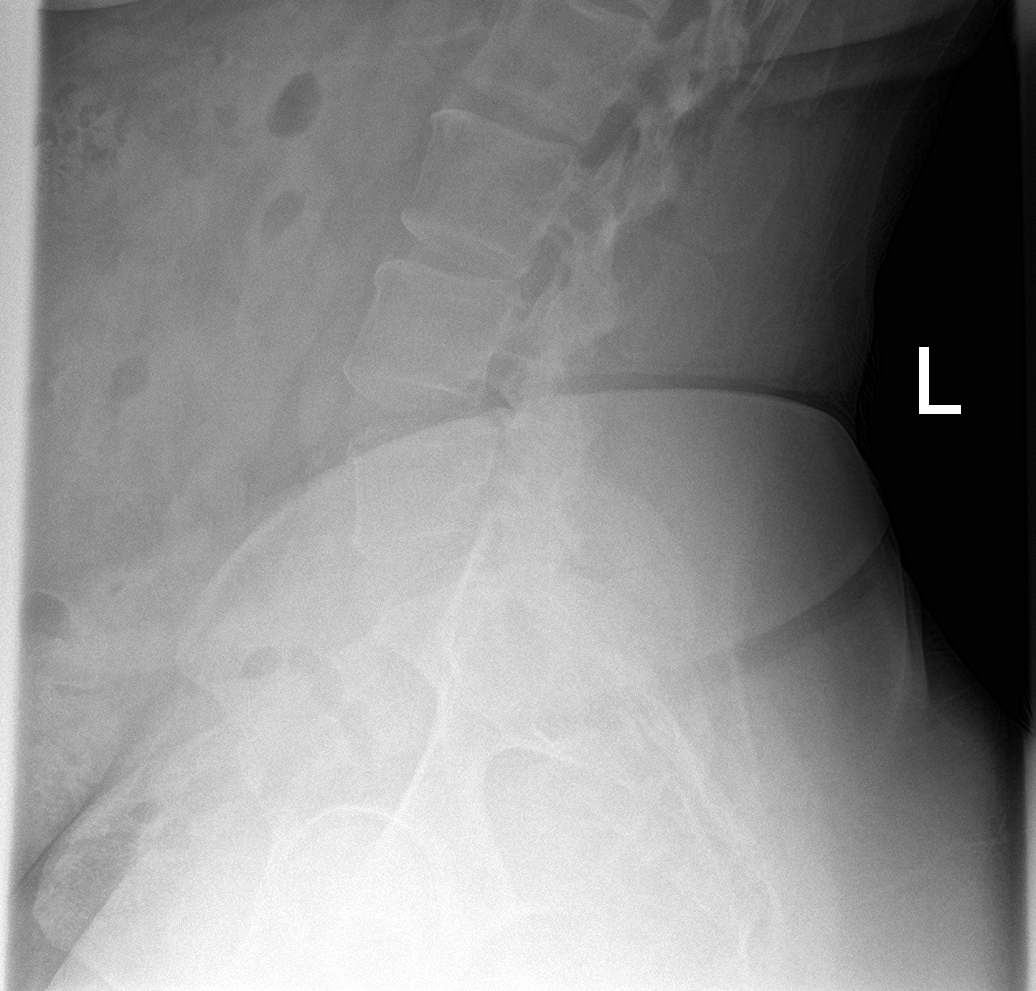

[7 of 7 positions shown; findings below may reference images not displayed]

FINDINGS: Facet arthrosis of L4-S1 is not well profiled on this examination.
Grade 1 anterolisthesis L4-5 is present. Otherwise normal lumbar
lordosis. No acute fracture of the lumbar spine. Vertebral body
height is preserved. Rib intervertebral disc height is preserved.
Oblique views demonstrate no evidence of pars defect. Flexion
extension positioning demonstrates no abnormal angulatory or
translatory motion. The paraspinal soft tissues are unremarkable.
IMPRESSION: Facet arthrosis L4-S1 with associated grade 1 anterolisthesis L4-5.

## 2022-09-18 ENCOUNTER — Encounter: Payer: BC Managed Care – PPO | Admitting: Physical Therapy

## 2022-09-23 ENCOUNTER — Ambulatory Visit: Payer: BC Managed Care – PPO | Attending: Pain Medicine | Admitting: Physical Therapy

## 2022-09-23 DIAGNOSIS — G894 Chronic pain syndrome: Secondary | ICD-10-CM | POA: Diagnosis present

## 2022-09-23 DIAGNOSIS — M5459 Other low back pain: Secondary | ICD-10-CM | POA: Diagnosis present

## 2022-09-23 DIAGNOSIS — M6281 Muscle weakness (generalized): Secondary | ICD-10-CM | POA: Insufficient documentation

## 2022-09-23 NOTE — Therapy (Signed)
OUTPATIENT PHYSICAL THERAPY TREATMENT NOTE   Patient Name: Donna Cannon MRN: [id number] DOB:January 10, 1966, 56 y.o., female Today's Date: 09/29/2022    END OF SESSION:   PT End of Session - 09/29/22 0009     Visit Number 5    Number of Visits 13    Date for PT Re-Evaluation 10/16/22    Authorization Type BCBC 2023, 30 visit limit for PT/OT combined per year    Progress Note Due on Visit 10    PT Start Time 1303    PT Stop Time 1345    PT Time Calculation (min) 42 min    Activity Tolerance Patient limited by pain;Patient tolerated treatment well    Behavior During Therapy WFL for tasks assessed/performed              Past Medical History:  Diagnosis Date   Anxiety    Arthritis    Cancer ([place])    melanoma   GERD (gastroesophageal reflux disease)    Hypertension    Hypothyroidism    Mitral valve prolapse    " mild"   PONV (postoperative nausea and vomiting)    Sacroiliac dysfunction    left   Spinal headache    Wears glasses    Past Surgical History:  Procedure Laterality Date   BACK SURGERY     lumbar decompression   CESAREAN SECTION     COLONOSCOPY W/ BIOPSIES AND POLYPECTOMY     ENDOMETRIAL ABLATION     ESOPHAGOGASTRODUODENOSCOPY (EGD) WITH PROPOFOL N/A 12/12/2016   Procedure: ESOPHAGOGASTRODUODENOSCOPY (EGD) WITH PROPOFOL;  Surgeon: Jonathon Bellows, MD;  Location: [facility] ENDOSCOPY;  Service: Endoscopy;  Laterality: N/A;   TUBAL LIGATION     Patient Active Problem List   Diagnosis Date Noted   Lumbosacral facet arthropathy (Multilevel) (Bilateral) 03/17/2022   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 03/17/2022   Lumbar foraminal stenosis (L4-5) (Bilateral) 03/17/2022   DDD (degenerative disc disease), lumbosacral 03/17/2022   Cervical facet hypertrophy 03/17/2022   DDD (degenerative disc disease), cervical 03/17/2022   Chronic feet pain (5th area of Pain) (Bilateral) 03/17/2022   Chronic wrist pain (7th area of Pain) (Right) 03/17/2022    Chronic low back pain (1ry area of Pain) (Bilateral) (L>R) w/o sciatica 03/17/2022   Failed back surgical syndrome (2015) 03/17/2022   Chronic lower extremity pain (2ry area of Pain) (Bilateral) (L>R) 03/17/2022   Chronic lower extremity radicular pain (S1 dermatome) (Bilateral) 03/17/2022   Chronic upper extremity pain (3ry area of Pain) (Left) 03/17/2022   Chronic neck pain (4th area of Pain) (Posterior) (Bilateral) (L>R) 03/17/2022   Chronic ankle pain (6th area of Pain) (Bilateral) 03/17/2022   Grade 1 Retrolisthesis of cervical spine (C3/C4 and C5/C6) 03/17/2022   Decreased range of motion of lumbar spine 03/17/2022   Painful cervical range of motion 03/17/2022   Impaired range of motion of cervical spine 03/17/2022   Lumbar facet syndrome 03/17/2022   Chronic hip pain (Bilateral) 03/17/2022   Chronic sacroiliac joint pain (Bilateral) 03/17/2022   Decreased GFR 03/17/2022   Elevated liver enzymes 03/17/2022   Chronic pain syndrome 03/13/2022   Pharmacologic therapy 03/13/2022   Disorder of skeletal system 03/13/2022   Problems influencing health status 03/13/2022   Abnormal MRI, cervical spine (11/18/2021) 03/13/2022   Abnormal MRI, lumbar spine (10/16/2019) 03/13/2022   Radiculopathy of cervical region 11/05/2021   Spinal stenosis of cervical region 11/05/2021   Gouty arthritis of great toe (Right) 06/06/2021   Intervertebral disc disorder with radiculopathy of lumbar  region 04/30/2021   Thoracic spine pain 04/30/2021   Osteopenia of multiple sites 03/25/2021   Secondary hyperparathyroidism of renal origin ([place]) 03/25/2021   BMI 32.0-32.9,adult 09/14/2020   Spondylosis of lumbar region without myelopathy or radiculopathy 09/14/2020   Stage 3a chronic kidney disease ([place]) 07/11/2020   Intervertebral disc stenosis of neural canal of cervical region 08/30/2019   SOBOE (shortness of breath on exertion) 06/13/2019   PVC (premature ventricular contraction) 12/29/2018   Chronic low  back pain (Bilateral) (L>R) w/ sciatica (Bilateral) (L>R) 03/16/2017   Sciatica 03/16/2017   Gastroesophageal reflux disease 11/17/2016   Gastroesophageal reflux disease with esophagitis 11/13/2016   Benign essential hypertension 09/18/2015   Mixed hyperlipidemia 12/06/2014   Palpitations 12/06/2014   History of melanoma 11/20/2014   Mitral valve prolapse 11/16/2013   Basal cell carcinoma 10/21/2013   History of basal cell cancer 10/21/2013   Acquired hypothyroidism 09/12/2013   Anxiety 09/12/2013   Increased frequency of urination 09/12/2013    PCP: Hortencia Pilar, MD   REFERRING PROVIDER: Milinda Pointer, MD   REFERRING DIAGNOSIS:  M47.816 (ICD-10-CM) - Spondylosis without myelopathy or radiculopathy, lumbar region  M54.50 (ICD-10-CM) - Low back pain, unspecified  G89.29 (ICD-10-CM) - Other chronic pain  M51.37 (ICD-10-CM) - Other intervertebral disc degeneration, lumbosacral region  M96.1 (ICD-10-CM) - Postlaminectomy syndrome, not elsewhere classified  R93.7 (ICD-10-CM) - Abnormal findings on diagnostic imaging of other parts of musculoskeletal system      THERAPY DIAG: Other low back pain   Chronic pain syndrome   Muscle weakness (generalized)   RATIONALE FOR EVALUATION AND TREATMENT: Rehabilitation   ONSET DATE: 10 years ago with progressive worsening   FOLLOW UP APPT WITH PROVIDER: Yes , pt to f/u with Dr. Dossie Arbour next month      PERTINENT HISTORY: Patient is a 56 year old female with primary complaint of low back pain s/p lumbar laminectomy at L4-5 in 2016; pt reports episodes of low back pain even at younger age. Pt has chronic low back pain with episodic flare-ups with involved history of conservative interventions and eventually L4-5 laminectomy in 2016. Pt is s/p 2 medial branch blocks that did help significantly. Patient has referring diagnosis of lumbar facet syndrome. Pt states her condition is degenerative and she feels that it is not going to get better.  She reports having difficulty with walking very far. Pt reports responding well to heat, but having to back down from this due to overuse and getting superficial burns. Pt works sedentary job. No sciatica; hx of ESI that notably improved sciatic-type symptoms. Patient has comorbid neck pain with Hx of C6-7 retrolisthesis. Pt reports disturbed sleep in the night with lying on her R side; she is able to get more comfortable with lying on L side. Pt denies bowel/bladder changes. No sudden weight change. (+) shopping cart sign.   Pain:  Pain Intensity: Present: 6/10, Best: 4/10, Worst: 8/10 Pain location: Varying between R and L lumbar flank Pain Quality: stabbing Radiating: Yes , varying between R and L flank Numbness/Tingling: Yes, in her feet intermittently Focal Weakness: Yes, weakness in her ankles, Hx of several ankle sprains Aggravating factors: twisting of her back, vacuuming/mopping, prolonged sitting, prolonged standing Relieving factors: lying flat on bed, on the move  24-hour pain behavior: worse in PM How long can you sit: up to 1 hour prior to severe pain  How long can you stand: 10 minutes History of prior back injury, pain, surgery, or therapy: Yes, Hx of lumbar laminectomy in 2016 Falls:  Has patient fallen in last 6 months? 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[employer], desk work mainly [place]: going to [employer], walking out in nature Red flags (bowel/bladder changes, saddle paresthesia, personal history of cancer, h/o spinal tumors, h/o compression fx, h/o abdominal aneurysm, abdominal pain, chills/fever, night sweats, nausea, vomiting, unrelenting pain, first onset of insidious LBP <20 y/o): Positive for skin cancer   Precautions: Osteopenia   Weight Bearing Restrictions: No   Living Environment Lives with: lives with their  spouse Lives in: House/apartment Has following equipment at home: None     Patient Goals: More strength, more stamina for performing activity     OBJECTIVE: (objective measures completed at initial evaluation unless otherwise dated)  Patient Surveys  FOTO 30, predicted score of 43   Cognition Patient is oriented to person, place, and time.  Recent memory is intact.  Remote memory is intact.  Attention span and concentration are intact.  Expressive speech is intact.  Patient's fund of knowledge is within normal limits for educational level.                            Gross Musculoskeletal Assessment Tremor: None Bulk: Normal No visible step-off along spinal column, no signs of scoliosis     GAIT: Unremarkable     Posture: Lumbar lordosis: Decreased Thoracic kyphosis: Increased Forward head, rounded shoulders Lumbar lateral shift: Negative     AROM       AROM (Normal range in degrees) AROM  09/03/2022  Lumbar    Flexion (65) 50%*   Extension (30) <25%*  Right lateral flexion (25) 50%*   Left lateral flexion (25) 50%*   Right rotation (30) 25%*  Left rotation (30) 25%*         Hip Right Left  Flexion (125) WNL* WNL*  Extension (15)      Abduction (40)      Adduction       Internal Rotation (45)      External Rotation (45) WNL WNL         Knee      Flexion (135)      Extension (0)             Ankle      Dorsiflexion (20)      Plantarflexion (50)      Inversion (35)      Eversion (15      (* = pain; Blank rows = not tested)     LE MMT:   MMT (out of 5) Right 09/03/2022 Left 09/03/2022  Hip flexion 4* 4+*  Hip extension      Hip abduction 4* 4*  Hip adduction 5 5  Hip internal rotation      Hip external rotation      Knee flexion 4+ 4+  Knee extension 4+ 4+  Ankle dorsiflexion 5 5  Ankle plantarflexion      Ankle inversion      Ankle eversion      (* = pain; Blank rows = not tested)     Sensation Grossly intact to light touch  bilateral LEs as determined by testing dermatomes L2-S2. Proprioception, and hot/cold testing deferred on this date.     Reflexes R/L Knee Jerk (L3/4): 2+/2+  Ankle Jerk (S1/2): 2+/2+  UMN signs (-)  Muscle Length Hamstrings: R: 20 deg knee extension lacking,  L: Positive for pain prior to tissue restriction; 30 deg knee extension lacking [place] (quadriceps): R: Not examined L: Not examined     Palpation   Location LEFT  RIGHT           Lumbar paraspinals 2 2  Quadratus Lumborum      Gluteus Maximus 1 1  Gluteus Medius 1 2  Deep hip external rotators      PSIS 1 1  Fortin's Area (SIJ) 1 1  Greater Trochanter      (Blank rows = not tested) Graded on 0-4 scale (0 = no pain, 1 = pain, 2 = pain with wincing/grimacing/flinching, 3 = pain with withdrawal, 4 = unwilling to allow palpation), (Blank rows = not tested)     Passive Accessory Intervertebral Motion Reproduction of back pain with CPA L1-S1 and UPA bilaterally L1-L5, pain prior to restriction   SPECIAL TESTS Lumbar Radiculopathy and Discogenic: Centralization and Peripheralization (SN 92, -LR 0.12): Not examined Slump (SN 83, -LR 0.32): R: Negative L: Positive SLR (SN 92, -LR 0.29): R: Positive L:  Positive Crossed SLR (SP 90): R: Not examined L: Negative General lumbar distraction: Negative Compression: Negative    Facet Joint: Extension-Rotation (SN 100, -LR 0.0): R: Not done L: Not done   Lumbar Foraminal Stenosis: Lumbar quadrant (SN 70): R: Positive L: Positive   SIJ:  SI Compression: Positive          TODAY'S TREATMENT  SUBJECTIVE: Pt reports notable pain described as shock along L lumbar flank; she's experienced this since yesterday and today with standing. Patient is unsure if it is muscle spasm. Patient reports 5/10 pain at arrival to PT. Patient voices notable distress related to persistent pain, and she feels that it is something she will have to live with.    PAIN:  Are you having pain? Yes:  NPRS scale: 5/10 Pain location: Across low back, mainly R side today   MHP (unbilled) upon arrival for pain control; along low back while seated on bike - for pain modulation and soft tissue mobility/extensibility.    Manual Therapy - for symptom modulation, soft tissue sensitivity and mobility, joint mobility, ROM.    In supine: Bilateral long-leg distraction for lumbar nerve root decompression and pain modulation; intermittent, 10 sec on, 5 sec off; x 5 minutes   In prone:  IASTM with Hypervolt to bilateral lumbar paraspinals for 10 minutes to improve tissues extensibility, CNS pain sensitization.   CPA grade 2 T5-T12 for symptom modulation 2x30 each segment, grade 3 at T7-T12 for thoracic mobility 1x30 sec each segment  Initiated lumbar spine CPA, held due to significant pain with L1-L5 PA mobilization      Therapeutic Exercise - for improved soft tissue flexibility and extensibility as needed for ROM, neurodynamics to improve neural/dural tissue mobility and reduce pain related to adherent nerve root, graded movement to improve tolerance of accessing thoracolumbar ROM    NuStep; x 3:45  minutes,L2; with conjunct use of MHP; for nervous system down-regulation/decreased central sensitivity    -stopped early due to c/o pain   Lower trunk rotations, hooklying; 1x10 ea dir   Open book, sidelying R and L; x10 ea dir    Posterior pelvic tilts: 2x12. Min TC's on lumbar spine. Good carryover.     Supine sciatic nerve flossing, no reproduction of leg pain or numbness; 2x10     PATIENT EDUCATION: Reviewed concepts of pain science education and ensured  patient that her current imaging findings and diagnoses do not guarantee persisting pain. Reviewed concept of pain serving as alarm and using graded movement, gentle aerobic work, and gradual exposure to challenging movements and positions to improve pain experience.     *not today*  Gentle glut bridge + posterior tilt: x3. Onset of LBP  so discontinued. Regressed to posterior tilt + glut max isometric. X10.   Standing scap retractions at [place]: 1x6, 30#. 2x6, 20#. Decreased weight due to pain.    Seated lat pull down at [place]: 3x6, 30#.   Standing D2 with YTB: x8/UE. VC's to limit thoracic rotation.     PATIENT EDUCATION:  Education details: exercise technique for HEP carryover, pain science education   Person educated: Patient Education method: Explanation Education comprehension: verbalized understanding      HOME EXERCISE PROGRAM: Access Code: [email or web address] URL: [email or web address] Date: 09/06/2022 Prepared by: Valentina Gu  Exercises - Supine Double Knee to Chest  - 2-3 x daily - 7 x weekly - 1-2 sets - 10 reps - 1sec hold - Supine Lower Trunk Rotation  - 2 x daily - 7 x weekly - 1-2 sets - 10 reps - 1sec hold - Sidelying Thoracic Rotation with Open Book  - 2 x daily - 7 x weekly - 1-2 sets - 10 reps  Patient Education - What Is Pain?   ASSESSMENT:   CLINICAL IMPRESSION: Patient unfortunately is significantly distressed given persistent nature of her condition and no improvement with previous intervention including laminectomy. She has limited tolerance of seated aerobic work today, but she does respond well with manual work with exception of L1-5 P-A mobilization. Patient did experience flare-up with recent travels with significant time spent sitting in car; reviewed positional recommendations to improve ability to tolerate this position and further re-assured patient that her condition can improve in spite of degenerative changes, with pt voicing sound knowledge of typical age-related findings.  Pt will continue to benefit from skilled PT services to continue to address deficits and pain to improve functional mobility and QoL.    REHAB POTENTIAL: Fair chronic pain with involved interventional history, complicated MSK pain history, comorbiditites   CLINICAL DECISION MAKING:  Unstable/unpredictable   EVALUATION COMPLEXITY: Moderate     GOALS:   SHORT TERM GOALS: Target date: 09/17/2022   Pt will be independent with HEP to improve strength and decrease back pain to improve pain-free function at home and work. Baseline: 09/02/22: Baseline HEP to be initiated on visit # 2.  Goal status: INITIAL     LONG TERM GOALS: Target date: 10/16/2022   Pt will increase FOTO to at least 46 to demonstrate significant improvement in function at home and work related to back pain  Baseline: 09/02/22: 30 Goal status: INITIAL   2.  Pt will decrease worst back pain by at least 2 points on the NPRS in order to demonstrate clinically significant reduction in back pain. Baseline: 09/02/22: 8/10 pain at worst Goal status: INITIAL   3.  Pt will have thoracolumbar AROM at least 75% or greater for all motions without increase in baseline pain as needed for reaching, self-care ADLs, bending to retrive item       Baseline: 09/02/22: Pain and significant motion loss with all directions Goal status: INITIAL   4.  Patient will tolerate standing/ambulatory activity up to 30 minutes without increase in baseline pain as needed for improved ability to complete community outings, cooking/cleaning, and leisure activities with her family Baseline: 09/02/22: Pt tolerates standing  up to 10 minutes Goal status: INITIAL   5.  Patient will tolerate sitting up to 1 hour or greater without increase in baseline pain as needed for improved tolerance of leisure activity with her spouse and family and driving/riding in vehicle Baseline: 09/02/22: Severe pain following 1 hour of sitting Goal status: INITIAL     PLAN: PT FREQUENCY: 2x/week   PT DURATION: 6 weeks   PLANNED INTERVENTIONS: Therapeutic exercises, Therapeutic activity, Neuromuscular re-education,  Patient/Family education, Joint mobilization, Aquatic Therapy, Dry Needling, Electrical stimulation, Spinal mobilization, Cryotherapy, Moist heat, and  Manual therapy   PLAN FOR NEXT SESSION: Pain neuroscience/persistent pain education, review normal asymptomatic imaging findings. Manual therapy and modalities as needed for pain, graded thoracolumbar and hip movement as tolerated. Utilize aerobic exercise for nervous system down-regulation.   Valentina Gu, PT, DPT #[id number]  Eilleen Kempf 09/29/2022, 12:10 AM

## 2022-09-25 ENCOUNTER — Encounter: Payer: BC Managed Care – PPO | Admitting: Physical Therapy

## 2022-09-29 ENCOUNTER — Encounter: Payer: Self-pay | Admitting: Physical Therapy

## 2022-09-30 ENCOUNTER — Encounter: Payer: Self-pay | Admitting: Physical Therapy

## 2022-09-30 ENCOUNTER — Ambulatory Visit: Payer: BC Managed Care – PPO | Admitting: Physical Therapy

## 2022-09-30 DIAGNOSIS — M5459 Other low back pain: Secondary | ICD-10-CM

## 2022-09-30 DIAGNOSIS — M6281 Muscle weakness (generalized): Secondary | ICD-10-CM

## 2022-09-30 DIAGNOSIS — G894 Chronic pain syndrome: Secondary | ICD-10-CM

## 2022-09-30 NOTE — Therapy (Unsigned)
OUTPATIENT PHYSICAL THERAPY TREATMENT NOTE   Patient Name: Donna Cannon MRN: [id number] DOB:13-Aug-1966, 56 y.o., female Today's Date: 09/30/2022    END OF SESSION:   PT End of Session - 09/30/22 1257     Visit Number 6    Number of Visits 13    Date for PT Re-Evaluation 10/16/22    Authorization Type BCBC 2023, 30 visit limit for PT/OT combined per year    Progress Note Due on Visit 10    PT Start Time 1300    PT Stop Time 1344    PT Time Calculation (min) 44 min    Activity Tolerance Patient limited by pain;Patient tolerated treatment well    Behavior During Therapy WFL for tasks assessed/performed               Past Medical History:  Diagnosis Date   Anxiety    Arthritis    Cancer ([place])    melanoma   GERD (gastroesophageal reflux disease)    Hypertension    Hypothyroidism    Mitral valve prolapse    " mild"   PONV (postoperative nausea and vomiting)    Sacroiliac dysfunction    left   Spinal headache    Wears glasses    Past Surgical History:  Procedure Laterality Date   BACK SURGERY     lumbar decompression   CESAREAN SECTION     COLONOSCOPY W/ BIOPSIES AND POLYPECTOMY     ENDOMETRIAL ABLATION     ESOPHAGOGASTRODUODENOSCOPY (EGD) WITH PROPOFOL N/A 12/12/2016   Procedure: ESOPHAGOGASTRODUODENOSCOPY (EGD) WITH PROPOFOL;  Surgeon: Jonathon Bellows, MD;  Location: [facility] ENDOSCOPY;  Service: Endoscopy;  Laterality: N/A;   TUBAL LIGATION     Patient Active Problem List   Diagnosis Date Noted   Lumbosacral facet arthropathy (Multilevel) (Bilateral) 03/17/2022   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 03/17/2022   Lumbar foraminal stenosis (L4-5) (Bilateral) 03/17/2022   DDD (degenerative disc disease), lumbosacral 03/17/2022   Cervical facet hypertrophy 03/17/2022   DDD (degenerative disc disease), cervical 03/17/2022   Chronic feet pain (5th area of Pain) (Bilateral) 03/17/2022   Chronic wrist pain (7th area of Pain) (Right) 03/17/2022    Chronic low back pain (1ry area of Pain) (Bilateral) (L>R) w/o sciatica 03/17/2022   Failed back surgical syndrome (2015) 03/17/2022   Chronic lower extremity pain (2ry area of Pain) (Bilateral) (L>R) 03/17/2022   Chronic lower extremity radicular pain (S1 dermatome) (Bilateral) 03/17/2022   Chronic upper extremity pain (3ry area of Pain) (Left) 03/17/2022   Chronic neck pain (4th area of Pain) (Posterior) (Bilateral) (L>R) 03/17/2022   Chronic ankle pain (6th area of Pain) (Bilateral) 03/17/2022   Grade 1 Retrolisthesis of cervical spine (C3/C4 and C5/C6) 03/17/2022   Decreased range of motion of lumbar spine 03/17/2022   Painful cervical range of motion 03/17/2022   Impaired range of motion of cervical spine 03/17/2022   Lumbar facet syndrome 03/17/2022   Chronic hip pain (Bilateral) 03/17/2022   Chronic sacroiliac joint pain (Bilateral) 03/17/2022   Decreased GFR 03/17/2022   Elevated liver enzymes 03/17/2022   Chronic pain syndrome 03/13/2022   Pharmacologic therapy 03/13/2022   Disorder of skeletal system 03/13/2022   Problems influencing health status 03/13/2022   Abnormal MRI, cervical spine (11/18/2021) 03/13/2022   Abnormal MRI, lumbar spine (10/16/2019) 03/13/2022   Radiculopathy of cervical region 11/05/2021   Spinal stenosis of cervical region 11/05/2021   Gouty arthritis of great toe (Right) 06/06/2021   Intervertebral disc disorder with radiculopathy of  lumbar region 04/30/2021   Thoracic spine pain 04/30/2021   Osteopenia of multiple sites 03/25/2021   Secondary hyperparathyroidism of renal origin ([place]) 03/25/2021   BMI 32.0-32.9,adult 09/14/2020   Spondylosis of lumbar region without myelopathy or radiculopathy 09/14/2020   Stage 3a chronic kidney disease ([place]) 07/11/2020   Intervertebral disc stenosis of neural canal of cervical region 08/30/2019   SOBOE (shortness of breath on exertion) 06/13/2019   PVC (premature ventricular contraction) 12/29/2018   Chronic low  back pain (Bilateral) (L>R) w/ sciatica (Bilateral) (L>R) 03/16/2017   Sciatica 03/16/2017   Gastroesophageal reflux disease 11/17/2016   Gastroesophageal reflux disease with esophagitis 11/13/2016   Benign essential hypertension 09/18/2015   Mixed hyperlipidemia 12/06/2014   Palpitations 12/06/2014   History of melanoma 11/20/2014   Mitral valve prolapse 11/16/2013   Basal cell carcinoma 10/21/2013   History of basal cell cancer 10/21/2013   Acquired hypothyroidism 09/12/2013   Anxiety 09/12/2013   Increased frequency of urination 09/12/2013    PCP: Hortencia Pilar, MD   REFERRING PROVIDER: Milinda Pointer, MD   REFERRING DIAGNOSIS:  M47.816 (ICD-10-CM) - Spondylosis without myelopathy or radiculopathy, lumbar region  M54.50 (ICD-10-CM) - Low back pain, unspecified  G89.29 (ICD-10-CM) - Other chronic pain  M51.37 (ICD-10-CM) - Other intervertebral disc degeneration, lumbosacral region  M96.1 (ICD-10-CM) - Postlaminectomy syndrome, not elsewhere classified  R93.7 (ICD-10-CM) - Abnormal findings on diagnostic imaging of other parts of musculoskeletal system      THERAPY DIAG: Other low back pain   Chronic pain syndrome   Muscle weakness (generalized)   RATIONALE FOR EVALUATION AND TREATMENT: Rehabilitation   ONSET DATE: 10 years ago with progressive worsening   FOLLOW UP APPT WITH PROVIDER: Yes , pt to f/u with Dr. Dossie Arbour next month      PERTINENT HISTORY: Patient is a 56 year old female with primary complaint of low back pain s/p lumbar laminectomy at L4-5 in 2016; pt reports episodes of low back pain even at younger age. Pt has chronic low back pain with episodic flare-ups with involved history of conservative interventions and eventually L4-5 laminectomy in 2016. Pt is s/p 2 medial branch blocks that did help significantly. Patient has referring diagnosis of lumbar facet syndrome. Pt states her condition is degenerative and she feels that it is not going to get better.  She reports having difficulty with walking very far. Pt reports responding well to heat, but having to back down from this due to overuse and getting superficial burns. Pt works sedentary job. No sciatica; hx of ESI that notably improved sciatic-type symptoms. Patient has comorbid neck pain with Hx of C6-7 retrolisthesis. Pt reports disturbed sleep in the night with lying on her R side; she is able to get more comfortable with lying on L side. Pt denies bowel/bladder changes. No sudden weight change. (+) shopping cart sign.   Pain:  Pain Intensity: Present: 6/10, Best: 4/10, Worst: 8/10 Pain location: Varying between R and L lumbar flank Pain Quality: stabbing Radiating: Yes , varying between R and L flank Numbness/Tingling: Yes, in her feet intermittently Focal Weakness: Yes, weakness in her ankles, Hx of several ankle sprains Aggravating factors: twisting of her back, vacuuming/mopping, prolonged sitting, prolonged standing Relieving factors: lying flat on bed, on the move  24-hour pain behavior: worse in PM How long can you sit: up to 1 hour prior to severe pain  How long can you stand: 10 minutes History of prior back injury, pain, surgery, or therapy: Yes, Hx of lumbar laminectomy in 2016  Falls: Has patient fallen in last 6 months? 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[employer], desk work mainly [employer]: going to [employer], walking out in nature Red flags (bowel/bladder changes, saddle paresthesia, personal history of cancer, h/o spinal tumors, h/o compression fx, h/o abdominal aneurysm, abdominal pain, chills/fever, night sweats, nausea, vomiting, unrelenting pain, first onset of insidious LBP <20 y/o): Positive for skin cancer   Precautions: Osteopenia   Weight Bearing Restrictions: No   Living Environment Lives with: lives with their  spouse Lives in: House/apartment Has following equipment at home: None     Patient Goals: More strength, more stamina for performing activity     OBJECTIVE: (objective measures completed at initial evaluation unless otherwise dated)  Patient Surveys  FOTO 30, predicted score of 67   Cognition Patient is oriented to person, place, and time.  Recent memory is intact.  Remote memory is intact.  Attention span and concentration are intact.  Expressive speech is intact.  Patient's fund of knowledge is within normal limits for educational level.                            Gross Musculoskeletal Assessment Tremor: None Bulk: Normal No visible step-off along spinal column, no signs of scoliosis     GAIT: Unremarkable     Posture: Lumbar lordosis: Decreased Thoracic kyphosis: Increased Forward head, rounded shoulders Lumbar lateral shift: Negative     AROM       AROM (Normal range in degrees) AROM  09/03/2022  Lumbar    Flexion (65) 50%*   Extension (30) <25%*  Right lateral flexion (25) 50%*   Left lateral flexion (25) 50%*   Right rotation (30) 25%*  Left rotation (30) 25%*         Hip Right Left  Flexion (125) WNL* WNL*  Extension (15)      Abduction (40)      Adduction       Internal Rotation (45)      External Rotation (45) WNL WNL         Knee      Flexion (135)      Extension (0)             Ankle      Dorsiflexion (20)      Plantarflexion (50)      Inversion (35)      Eversion (15      (* = pain; Blank rows = not tested)     LE MMT:   MMT (out of 5) Right 09/03/2022 Left 09/03/2022  Hip flexion 4* 4+*  Hip extension      Hip abduction 4* 4*  Hip adduction 5 5  Hip internal rotation      Hip external rotation      Knee flexion 4+ 4+  Knee extension 4+ 4+  Ankle dorsiflexion 5 5  Ankle plantarflexion      Ankle inversion      Ankle eversion      (* = pain; Blank rows = not tested)     Sensation Grossly intact to light touch  bilateral LEs as determined by testing dermatomes L2-S2. Proprioception, and hot/cold testing deferred on this date.     Reflexes R/L Knee Jerk (L3/4): 2+/2+  Ankle Jerk (S1/2): 2+/2+  UMN  signs (-)    Muscle Length Hamstrings: R: 20 deg knee extension lacking,  L: Positive for pain prior to tissue restriction; 30 deg knee extension lacking [place] (quadriceps): R: Not examined L: Not examined     Palpation   Location LEFT  RIGHT           Lumbar paraspinals 2 2  Quadratus Lumborum      Gluteus Maximus 1 1  Gluteus Medius 1 2  Deep hip external rotators      PSIS 1 1  Fortin's Area (SIJ) 1 1  Greater Trochanter      (Blank rows = not tested) Graded on 0-4 scale (0 = no pain, 1 = pain, 2 = pain with wincing/grimacing/flinching, 3 = pain with withdrawal, 4 = unwilling to allow palpation), (Blank rows = not tested)     Passive Accessory Intervertebral Motion Reproduction of back pain with CPA L1-S1 and UPA bilaterally L1-L5, pain prior to restriction   SPECIAL TESTS Lumbar Radiculopathy and Discogenic: Centralization and Peripheralization (SN 92, -LR 0.12): Not examined Slump (SN 83, -LR 0.32): R: Negative L: Positive SLR (SN 92, -LR 0.29): R: Positive L:  Positive Crossed SLR (SP 90): R: Not examined L: Negative General lumbar distraction: Negative Compression: Negative    Facet Joint: Extension-Rotation (SN 100, -LR 0.0): R: Not done L: Not done   Lumbar Foraminal Stenosis: Lumbar quadrant (SN 70): R: Positive L: Positive   SIJ:  SI Compression: Positive          TODAY'S TREATMENT  SUBJECTIVE: Pt reports completing her walking program over this past week; she is able to complete up to 10 minute bouts of walking at this time prior to notable onset of pain. Pt reports having notable pain with vacuuming and mopping in her home. Patient reports pain more affecting R flank at arrival to PT.    PAIN:  Are you having pain? Yes: NPRS scale: 5/10 Pain location: Across  low back, mainly R side today   MHP (unbilled) upon arrival for pain control; along low back while seated on bike - for pain modulation and soft tissue mobility/extensibility. X 5 minutes in prone lying   Manual Therapy - for symptom modulation, soft tissue sensitivity and mobility, joint mobility, ROM.    In supine: Bilateral long-leg distraction for lumbar nerve root decompression and pain modulation; intermittent, 10 sec on, 5 sec off; x 5 minutes   *not today* Initiated lumbar spine CPA, held due to significant pain with L1-L5 PA mobilization   In prone:  IASTM with Hypervolt to bilateral lumbar paraspinals for 10 minutes to improve tissues extensibility, CNS pain sensitization.  CPA grade 2 T5-T12 for symptom modulation 2x30 each segment, grade 3 at T7-T12 for thoracic mobility 1x30 sec each segment     Therapeutic Exercise - for improved soft tissue flexibility and extensibility as needed for ROM, neurodynamics to improve neural/dural tissue mobility and reduce pain related to adherent nerve root, graded movement to improve tolerance of accessing thoracolumbar ROM    NuStep; x 5 minutes,L2; for nervous system down-regulation/decreased central sensitivity - subjective information discussed and pain science concepts reviewed during this time as well as anticipated progression of aerobic activity      Lower trunk rotations, hooklying; 1x10 ea dir   Open book, sidelying R and L; x10 ea dir    Quadruped sidebend, alternating R/L; x10 ea dir    Cat Camel; x10 ea dir, quadruped    Posterior pelvic tilts: 2x10. Min TC's  on lumbar spine to attain posterior tilt.    Supine sciatic nerve flossing, no reproduction of leg pain or numbness; 1x10     PATIENT EDUCATION: HEP update and review. Discussed role of graded movement to reduce threat to movement over time and decrease pain experience with challenging movements.      *not today*  Gentle glut bridge + posterior tilt: x3. Onset  of LBP so discontinued. Regressed to posterior tilt + glut max isometric. X10.   Standing scap retractions at [place]: 1x6, 30#. 2x6, 20#. Decreased weight due to pain.    Seated lat pull down at [place]: 3x6, 30#.   Standing D2 with YTB: x8/UE. VC's to limit thoracic rotation.     PATIENT EDUCATION:  Education details: exercise technique for HEP carryover, pain science education   Person educated: Patient Education method: Explanation Education comprehension: verbalized understanding      HOME EXERCISE PROGRAM: Access Code: [email or web address] URL: [email or web address] Date: 09/06/2022 Prepared by: Valentina Gu  Exercises - Supine Double Knee to Chest  - 2-3 x daily - 7 x weekly - 1-2 sets - 10 reps - 1sec hold - Supine Lower Trunk Rotation  - 2 x daily - 7 x weekly - 1-2 sets - 10 reps - 1sec hold - Sidelying Thoracic Rotation with Open Book  - 2 x daily - 7 x weekly - 1-2 sets - 10 reps  Patient Education - What Is Pain?   ASSESSMENT:   CLINICAL IMPRESSION: Patient is able to tolerate activity including stationary biking for light aerobic work today without significant exacerbation of symptoms. Pt is able to progress volume of thoracolumbar mobility work on table with gravity-eliminated positions with minimal effect on pain. Pt does have good response with manual lumbar traction today - utilized to enable pt to feel more comfortable with modestly progressing exercise. Will continue progressing movement in upright positions and ease into standing activity as tolerated.  Pt will continue to benefit from skilled PT services to continue to address deficits and pain to improve functional mobility and QoL.    REHAB POTENTIAL: Fair chronic pain with involved interventional history, complicated MSK pain history, comorbiditites   CLINICAL DECISION MAKING: Unstable/unpredictable   EVALUATION COMPLEXITY: Moderate     GOALS:   SHORT TERM GOALS: Target date: 09/17/2022    Pt will be independent with HEP to improve strength and decrease back pain to improve pain-free function at home and work. Baseline: 09/02/22: Baseline HEP to be initiated on visit # 2.  Goal status: INITIAL     LONG TERM GOALS: Target date: 10/16/2022   Pt will increase FOTO to at least 46 to demonstrate significant improvement in function at home and work related to back pain  Baseline: 09/02/22: 30 Goal status: INITIAL   2.  Pt will decrease worst back pain by at least 2 points on the NPRS in order to demonstrate clinically significant reduction in back pain. Baseline: 09/02/22: 8/10 pain at worst Goal status: INITIAL   3.  Pt will have thoracolumbar AROM at least 75% or greater for all motions without increase in baseline pain as needed for reaching, self-care ADLs, bending to retrive item       Baseline: 09/02/22: Pain and significant motion loss with all directions Goal status: INITIAL   4.  Patient will tolerate standing/ambulatory activity up to 30 minutes without increase in baseline pain as needed for improved ability to complete community outings, cooking/cleaning, and leisure activities with her family Baseline: 09/02/22: Pt tolerates standing  up to 10 minutes Goal status: INITIAL   5.  Patient will tolerate sitting up to 1 hour or greater without increase in baseline pain as needed for improved tolerance of leisure activity with her spouse and family and driving/riding in vehicle Baseline: 09/02/22: Severe pain following 1 hour of sitting Goal status: INITIAL     PLAN: PT FREQUENCY: 2x/week   PT DURATION: 6 weeks   PLANNED INTERVENTIONS: Therapeutic exercises, Therapeutic activity, Neuromuscular re-education,  Patient/Family education, Joint mobilization, Aquatic Therapy, Dry Needling, Electrical stimulation, Spinal mobilization, Cryotherapy, Moist heat, and Manual therapy   PLAN FOR NEXT SESSION: Pain neuroscience/persistent pain education, review normal asymptomatic  imaging findings. Manual therapy and modalities as needed for pain, graded thoracolumbar and hip movement as tolerated. Utilize aerobic exercise for nervous system down-regulation.   Valentina Gu, PT, DPT #[id number]  Eilleen Kempf 09/30/2022, 12:57 PM

## 2022-10-02 ENCOUNTER — Ambulatory Visit: Payer: BC Managed Care – PPO | Admitting: Physical Therapy

## 2022-10-02 NOTE — Therapy (Deleted)
OUTPATIENT PHYSICAL THERAPY TREATMENT NOTE   Patient Name: Donna Cannon MRN: [id number] DOB:Apr 24, 1966, 56 y.o., female Today's Date: 10/02/2022    END OF SESSION:       Past Medical History:  Diagnosis Date   Anxiety    Arthritis    Cancer ([place])    melanoma   GERD (gastroesophageal reflux disease)    Hypertension    Hypothyroidism    Mitral valve prolapse    " mild"   PONV (postoperative nausea and vomiting)    Sacroiliac dysfunction    left   Spinal headache    Wears glasses    Past Surgical History:  Procedure Laterality Date   BACK SURGERY     lumbar decompression   CESAREAN SECTION     COLONOSCOPY W/ BIOPSIES AND POLYPECTOMY     ENDOMETRIAL ABLATION     ESOPHAGOGASTRODUODENOSCOPY (EGD) WITH PROPOFOL N/A 12/12/2016   Procedure: ESOPHAGOGASTRODUODENOSCOPY (EGD) WITH PROPOFOL;  Surgeon: Jonathon Bellows, MD;  Location: [facility] ENDOSCOPY;  Service: Endoscopy;  Laterality: N/A;   TUBAL LIGATION     Patient Active Problem List   Diagnosis Date Noted   Lumbosacral facet arthropathy (Multilevel) (Bilateral) 03/17/2022   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 03/17/2022   Lumbar foraminal stenosis (L4-5) (Bilateral) 03/17/2022   DDD (degenerative disc disease), lumbosacral 03/17/2022   Cervical facet hypertrophy 03/17/2022   DDD (degenerative disc disease), cervical 03/17/2022   Chronic feet pain (5th area of Pain) (Bilateral) 03/17/2022   Chronic wrist pain (7th area of Pain) (Right) 03/17/2022   Chronic low back pain (1ry area of Pain) (Bilateral) (L>R) w/o sciatica 03/17/2022   Failed back surgical syndrome (2015) 03/17/2022   Chronic lower extremity pain (2ry area of Pain) (Bilateral) (L>R) 03/17/2022   Chronic lower extremity radicular pain (S1 dermatome) (Bilateral) 03/17/2022   Chronic upper extremity pain (3ry area of Pain) (Left) 03/17/2022   Chronic neck pain (4th area of Pain) (Posterior) (Bilateral) (L>R) 03/17/2022   Chronic ankle pain  (6th area of Pain) (Bilateral) 03/17/2022   Grade 1 Retrolisthesis of cervical spine (C3/C4 and C5/C6) 03/17/2022   Decreased range of motion of lumbar spine 03/17/2022   Painful cervical range of motion 03/17/2022   Impaired range of motion of cervical spine 03/17/2022   Lumbar facet syndrome 03/17/2022   Chronic hip pain (Bilateral) 03/17/2022   Chronic sacroiliac joint pain (Bilateral) 03/17/2022   Decreased GFR 03/17/2022   Elevated liver enzymes 03/17/2022   Chronic pain syndrome 03/13/2022   Pharmacologic therapy 03/13/2022   Disorder of skeletal system 03/13/2022   Problems influencing health status 03/13/2022   Abnormal MRI, cervical spine (11/18/2021) 03/13/2022   Abnormal MRI, lumbar spine (10/16/2019) 03/13/2022   Radiculopathy of cervical region 11/05/2021   Spinal stenosis of cervical region 11/05/2021   Gouty arthritis of great toe (Right) 06/06/2021   Intervertebral disc disorder with radiculopathy of lumbar region 04/30/2021   Thoracic spine pain 04/30/2021   Osteopenia of multiple sites 03/25/2021   Secondary hyperparathyroidism of renal origin ([place]) 03/25/2021   BMI 32.0-32.9,adult 09/14/2020   Spondylosis of lumbar region without myelopathy or radiculopathy 09/14/2020   Stage 3a chronic kidney disease ([place]) 07/11/2020   Intervertebral disc stenosis of neural canal of cervical region 08/30/2019   SOBOE (shortness of breath on exertion) 06/13/2019   PVC (premature ventricular contraction) 12/29/2018   Chronic low back pain (Bilateral) (L>R) w/ sciatica (Bilateral) (L>R) 03/16/2017   Sciatica 03/16/2017   Gastroesophageal reflux disease 11/17/2016   Gastroesophageal reflux disease with esophagitis  11/13/2016   Benign essential hypertension 09/18/2015   Mixed hyperlipidemia 12/06/2014   Palpitations 12/06/2014   History of melanoma 11/20/2014   Mitral valve prolapse 11/16/2013   Basal cell carcinoma 10/21/2013   History of basal cell cancer 10/21/2013   Acquired  hypothyroidism 09/12/2013   Anxiety 09/12/2013   Increased frequency of urination 09/12/2013    PCP: Hortencia Pilar, MD   REFERRING PROVIDER: Milinda Pointer, MD   REFERRING DIAGNOSIS:  M47.816 (ICD-10-CM) - Spondylosis without myelopathy or radiculopathy, lumbar region  M54.50 (ICD-10-CM) - Low back pain, unspecified  G89.29 (ICD-10-CM) - Other chronic pain  M51.37 (ICD-10-CM) - Other intervertebral disc degeneration, lumbosacral region  M96.1 (ICD-10-CM) - Postlaminectomy syndrome, not elsewhere classified  R93.7 (ICD-10-CM) - Abnormal findings on diagnostic imaging of other parts of musculoskeletal system      THERAPY DIAG: Other low back pain   Chronic pain syndrome   Muscle weakness (generalized)   RATIONALE FOR EVALUATION AND TREATMENT: Rehabilitation   ONSET DATE: 10 years ago with progressive worsening   FOLLOW UP APPT WITH PROVIDER: Yes , pt to f/u with Dr. Dossie Arbour next month      PERTINENT HISTORY: Patient is a 56 year old female with primary complaint of low back pain s/p lumbar laminectomy at L4-5 in 2016; pt reports episodes of low back pain even at younger age. Pt has chronic low back pain with episodic flare-ups with involved history of conservative interventions and eventually L4-5 laminectomy in 2016. Pt is s/p 2 medial branch blocks that did help significantly. Patient has referring diagnosis of lumbar facet syndrome. Pt states her condition is degenerative and she feels that it is not going to get better. She reports having difficulty with walking very far. Pt reports responding well to heat, but having to back down from this due to overuse and getting superficial burns. Pt works sedentary job. No sciatica; hx of ESI that notably improved sciatic-type symptoms. Patient has comorbid neck pain with Hx of C6-7 retrolisthesis. Pt reports disturbed sleep in the night with lying on her R side; she is able to get more comfortable with lying on L side. Pt denies  bowel/bladder changes. No sudden weight change. (+) shopping cart sign.   Pain:  Pain Intensity: Present: 6/10, Best: 4/10, Worst: 8/10 Pain location: Varying between R and L lumbar flank Pain Quality: stabbing Radiating: Yes , varying between R and L flank Numbness/Tingling: Yes, in her feet intermittently Focal Weakness: Yes, weakness in her ankles, Hx of several ankle sprains Aggravating factors: twisting of her back, vacuuming/mopping, prolonged sitting, prolonged standing Relieving factors: lying flat on bed, on the move  24-hour pain behavior: worse in PM How long can you sit: up to 1 hour prior to severe pain  How long can you stand: 10 minutes History of prior back injury, pain, surgery, or therapy: Yes, Hx of lumbar laminectomy in 2016 Falls: Has patient fallen in last 6 months? 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[employer], desk work mainly [place]: going to [employer], walking out in nature Red flags (bowel/bladder changes, saddle paresthesia, personal history of cancer, h/o spinal tumors, h/o compression fx, h/o abdominal aneurysm, abdominal pain, chills/fever, night sweats, nausea, vomiting, unrelenting pain, first onset of insidious LBP <20 y/o): Positive for skin cancer   Precautions: Osteopenia   Weight Bearing Restrictions: No   Living Environment Lives with:  lives with their spouse Lives in: House/apartment Has following equipment at home: None     Patient Goals: More strength, more stamina for performing activity     OBJECTIVE: (objective measures completed at initial evaluation unless otherwise dated)  Patient Surveys  FOTO 30, predicted score of 64   Cognition Patient is oriented to person, place, and time.  Recent memory is intact.  Remote memory is intact.  Attention span and concentration are intact.   Expressive speech is intact.  Patient's fund of knowledge is within normal limits for educational level.                            Gross Musculoskeletal Assessment Tremor: None Bulk: Normal No visible step-off along spinal column, no signs of scoliosis     GAIT: Unremarkable     Posture: Lumbar lordosis: Decreased Thoracic kyphosis: Increased Forward head, rounded shoulders Lumbar lateral shift: Negative     AROM       AROM (Normal range in degrees) AROM  09/03/2022  Lumbar    Flexion (65) 50%*   Extension (30) <25%*  Right lateral flexion (25) 50%*   Left lateral flexion (25) 50%*   Right rotation (30) 25%*  Left rotation (30) 25%*         Hip Right Left  Flexion (125) WNL* WNL*  Extension (15)      Abduction (40)      Adduction       Internal Rotation (45)      External Rotation (45) WNL WNL         Knee      Flexion (135)      Extension (0)             Ankle      Dorsiflexion (20)      Plantarflexion (50)      Inversion (35)      Eversion (15      (* = pain; Blank rows = not tested)     LE MMT:   MMT (out of 5) Right 09/03/2022 Left 09/03/2022  Hip flexion 4* 4+*  Hip extension      Hip abduction 4* 4*  Hip adduction 5 5  Hip internal rotation      Hip external rotation      Knee flexion 4+ 4+  Knee extension 4+ 4+  Ankle dorsiflexion 5 5  Ankle plantarflexion      Ankle inversion      Ankle eversion      (* = pain; Blank rows = not tested)     Sensation Grossly intact to light touch bilateral LEs as determined by testing dermatomes L2-S2. Proprioception, and hot/cold testing deferred on this date.     Reflexes R/L Knee Jerk (L3/4): 2+/2+  Ankle Jerk (S1/2): 2+/2+  UMN signs (-)    Muscle Length Hamstrings: R: 20 deg knee extension lacking,  L: Positive for pain prior to tissue restriction; 30 deg knee extension lacking [place] (quadriceps): R: Not examined L: Not examined     Palpation   Location LEFT  RIGHT           Lumbar  paraspinals 2 2  Quadratus Lumborum      Gluteus Maximus 1 1  Gluteus Medius 1 2  Deep hip external rotators      PSIS 1 1  Fortin's Area (SIJ) 1 1  Greater Trochanter      (Blank rows =  not tested) Graded on 0-4 scale (0 = no pain, 1 = pain, 2 = pain with wincing/grimacing/flinching, 3 = pain with withdrawal, 4 = unwilling to allow palpation), (Blank rows = not tested)     Passive Accessory Intervertebral Motion Reproduction of back pain with CPA L1-S1 and UPA bilaterally L1-L5, pain prior to restriction   SPECIAL TESTS Lumbar Radiculopathy and Discogenic: Centralization and Peripheralization (SN 92, -LR 0.12): Not examined Slump (SN 83, -LR 0.32): R: Negative L: Positive SLR (SN 92, -LR 0.29): R: Positive L:  Positive Crossed SLR (SP 90): R: Not examined L: Negative General lumbar distraction: Negative Compression: Negative    Facet Joint: Extension-Rotation (SN 100, -LR 0.0): R: Not done L: Not done   Lumbar Foraminal Stenosis: Lumbar quadrant (SN 70): R: Positive L: Positive   SIJ:  SI Compression: Positive          TODAY'S TREATMENT  SUBJECTIVE: Pt reports completing her walking program over this past week; she is able to complete up to 10 minute bouts of walking at this time prior to notable onset of pain. Pt reports having notable pain with vacuuming and mopping in her home. Patient reports pain more affecting R flank at arrival to PT.    PAIN:  Are you having pain? Yes: NPRS scale: 5/10 Pain location: Across low back, mainly R side today   MHP (unbilled) upon arrival for pain control; along low back while seated on bike - for pain modulation and soft tissue mobility/extensibility. X 5 minutes in prone lying   Manual Therapy - for symptom modulation, soft tissue sensitivity and mobility, joint mobility, ROM.    In supine: Bilateral long-leg distraction for lumbar nerve root decompression and pain modulation; intermittent, 10 sec on, 5 sec off; x 5  minutes   *not today* Initiated lumbar spine CPA, held due to significant pain with L1-L5 PA mobilization   In prone:  IASTM with Hypervolt to bilateral lumbar paraspinals for 10 minutes to improve tissues extensibility, CNS pain sensitization.  CPA grade 2 T5-T12 for symptom modulation 2x30 each segment, grade 3 at T7-T12 for thoracic mobility 1x30 sec each segment     Therapeutic Exercise - for improved soft tissue flexibility and extensibility as needed for ROM, neurodynamics to improve neural/dural tissue mobility and reduce pain related to adherent nerve root, graded movement to improve tolerance of accessing thoracolumbar ROM    NuStep; x 5 minutes,L2; for nervous system down-regulation/decreased central sensitivity - subjective information discussed and pain science concepts reviewed during this time as well as anticipated progression of aerobic activity      Lower trunk rotations, hooklying; 1x10 ea dir   Open book, sidelying R and L; x10 ea dir    Quadruped sidebend, alternating R/L; x10 ea dir    Cat Camel; x10 ea dir, quadruped    Posterior pelvic tilts: 2x10. Min TC's on lumbar spine to attain posterior tilt.    Supine sciatic nerve flossing, no reproduction of leg pain or numbness; 1x10     PATIENT EDUCATION: HEP update and review. Discussed role of graded movement to reduce threat to movement over time and decrease pain experience with challenging movements.      *not today*  Gentle glut bridge + posterior tilt: x3. Onset of LBP so discontinued. Regressed to posterior tilt + glut max isometric. X10.   Standing scap retractions at [place]: 1x6, 30#. 2x6, 20#. Decreased weight due to pain.    Seated lat pull down at [place]: 3x6, 30#.  Standing D2 with YTB: x8/UE. VC's to limit thoracic rotation.     PATIENT EDUCATION:  Education details: exercise technique for HEP carryover, pain science education   Person educated: Patient Education method:  Explanation Education comprehension: verbalized understanding      HOME EXERCISE PROGRAM: Access Code: [email or web address] URL: [email or web address] Date: 09/06/2022 Prepared by: Donna Cannon  Exercises - Supine Double Knee to Chest  - 2-3 x daily - 7 x weekly - 1-2 sets - 10 reps - 1sec hold - Supine Lower Trunk Rotation  - 2 x daily - 7 x weekly - 1-2 sets - 10 reps - 1sec hold - Sidelying Thoracic Rotation with Open Book  - 2 x daily - 7 x weekly - 1-2 sets - 10 reps  Patient Education - What Is Pain?   ASSESSMENT:   CLINICAL IMPRESSION: Patient is able to tolerate activity including stationary biking for light aerobic work today without significant exacerbation of symptoms. Pt is able to progress volume of thoracolumbar mobility work on table with gravity-eliminated positions with minimal effect on pain. Pt does have good response with manual lumbar traction today - utilized to enable pt to feel more comfortable with modestly progressing exercise. Will continue progressing movement in upright positions and ease into standing activity as tolerated.  Pt will continue to benefit from skilled PT services to continue to address deficits and pain to improve functional mobility and QoL.    REHAB POTENTIAL: Fair chronic pain with involved interventional history, complicated MSK pain history, comorbiditites   CLINICAL DECISION MAKING: Unstable/unpredictable   EVALUATION COMPLEXITY: Moderate     GOALS:   SHORT TERM GOALS: Target date: 09/17/2022   Pt will be independent with HEP to improve strength and decrease back pain to improve pain-free function at home and work. Baseline: 09/02/22: Baseline HEP to be initiated on visit # 2.  Goal status: INITIAL     LONG TERM GOALS: Target date: 10/16/2022   Pt will increase FOTO to at least 46 to demonstrate significant improvement in function at home and work related to back pain  Baseline: 09/02/22: 30 Goal status: INITIAL   2.   Pt will decrease worst back pain by at least 2 points on the NPRS in order to demonstrate clinically significant reduction in back pain. Baseline: 09/02/22: 8/10 pain at worst Goal status: INITIAL   3.  Pt will have thoracolumbar AROM at least 75% or greater for all motions without increase in baseline pain as needed for reaching, self-care ADLs, bending to retrive item       Baseline: 09/02/22: Pain and significant motion loss with all directions Goal status: INITIAL   4.  Patient will tolerate standing/ambulatory activity up to 30 minutes without increase in baseline pain as needed for improved ability to complete community outings, cooking/cleaning, and leisure activities with her family Baseline: 09/02/22: Pt tolerates standing up to 10 minutes Goal status: INITIAL   5.  Patient will tolerate sitting up to 1 hour or greater without increase in baseline pain as needed for improved tolerance of leisure activity with her spouse and family and driving/riding in vehicle Baseline: 09/02/22: Severe pain following 1 hour of sitting Goal status: INITIAL     PLAN: PT FREQUENCY: 2x/week   PT DURATION: 6 weeks   PLANNED INTERVENTIONS: Therapeutic exercises, Therapeutic activity, Neuromuscular re-education,  Patient/Family education, Joint mobilization, Aquatic Therapy, Dry Needling, Electrical stimulation, Spinal mobilization, Cryotherapy, Moist heat, and Manual therapy   PLAN FOR NEXT SESSION: Pain neuroscience/persistent pain education, review  normal asymptomatic imaging findings. Manual therapy and modalities as needed for pain, graded thoracolumbar and hip movement as tolerated. Utilize aerobic exercise for nervous system down-regulation.   Donna Cannon, PT, DPT #[id number]  Donna Cannon 10/02/2022, 12:43 PM

## 2022-10-07 ENCOUNTER — Ambulatory Visit: Payer: BC Managed Care – PPO | Admitting: Physical Therapy

## 2022-10-07 ENCOUNTER — Encounter: Payer: Self-pay | Admitting: Physical Therapy

## 2022-10-07 DIAGNOSIS — M5459 Other low back pain: Secondary | ICD-10-CM

## 2022-10-07 DIAGNOSIS — M6281 Muscle weakness (generalized): Secondary | ICD-10-CM

## 2022-10-07 DIAGNOSIS — G894 Chronic pain syndrome: Secondary | ICD-10-CM

## 2022-10-07 NOTE — Therapy (Signed)
OUTPATIENT PHYSICAL THERAPY TREATMENT NOTE   Patient Name: Donna Cannon MRN: [id number] DOB:18-May-1966, 56 y.o., female Today's Date: 10/07/2022    END OF SESSION:   PT End of Session - 10/07/22 1525     Visit Number 7    Number of Visits 13    Date for PT Re-Evaluation 10/16/22    Authorization Type BCBC 2023, 30 visit limit for PT/OT combined per year    Progress Note Due on Visit 10    PT Start Time 4098    PT Stop Time 1600    PT Time Calculation (min) 43 min    Activity Tolerance Patient limited by pain;Patient tolerated treatment well    Behavior During Therapy WFL for tasks assessed/performed                Past Medical History:  Diagnosis Date   Anxiety    Arthritis    Cancer ([place])    melanoma   GERD (gastroesophageal reflux disease)    Hypertension    Hypothyroidism    Mitral valve prolapse    " mild"   PONV (postoperative nausea and vomiting)    Sacroiliac dysfunction    left   Spinal headache    Wears glasses    Past Surgical History:  Procedure Laterality Date   BACK SURGERY     lumbar decompression   CESAREAN SECTION     COLONOSCOPY W/ BIOPSIES AND POLYPECTOMY     ENDOMETRIAL ABLATION     ESOPHAGOGASTRODUODENOSCOPY (EGD) WITH PROPOFOL N/A 12/12/2016   Procedure: ESOPHAGOGASTRODUODENOSCOPY (EGD) WITH PROPOFOL;  Surgeon: Jonathon Bellows, MD;  Location: [facility] ENDOSCOPY;  Service: Endoscopy;  Laterality: N/A;   TUBAL LIGATION     Patient Active Problem List   Diagnosis Date Noted   Lumbosacral facet arthropathy (Multilevel) (Bilateral) 03/17/2022   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 03/17/2022   Lumbar foraminal stenosis (L4-5) (Bilateral) 03/17/2022   DDD (degenerative disc disease), lumbosacral 03/17/2022   Cervical facet hypertrophy 03/17/2022   DDD (degenerative disc disease), cervical 03/17/2022   Chronic feet pain (5th area of Pain) (Bilateral) 03/17/2022   Chronic wrist pain (7th area of Pain) (Right) 03/17/2022    Chronic low back pain (1ry area of Pain) (Bilateral) (L>R) w/o sciatica 03/17/2022   Failed back surgical syndrome (2015) 03/17/2022   Chronic lower extremity pain (2ry area of Pain) (Bilateral) (L>R) 03/17/2022   Chronic lower extremity radicular pain (S1 dermatome) (Bilateral) 03/17/2022   Chronic upper extremity pain (3ry area of Pain) (Left) 03/17/2022   Chronic neck pain (4th area of Pain) (Posterior) (Bilateral) (L>R) 03/17/2022   Chronic ankle pain (6th area of Pain) (Bilateral) 03/17/2022   Grade 1 Retrolisthesis of cervical spine (C3/C4 and C5/C6) 03/17/2022   Decreased range of motion of lumbar spine 03/17/2022   Painful cervical range of motion 03/17/2022   Impaired range of motion of cervical spine 03/17/2022   Lumbar facet syndrome 03/17/2022   Chronic hip pain (Bilateral) 03/17/2022   Chronic sacroiliac joint pain (Bilateral) 03/17/2022   Decreased GFR 03/17/2022   Elevated liver enzymes 03/17/2022   Chronic pain syndrome 03/13/2022   Pharmacologic therapy 03/13/2022   Disorder of skeletal system 03/13/2022   Problems influencing health status 03/13/2022   Abnormal MRI, cervical spine (11/18/2021) 03/13/2022   Abnormal MRI, lumbar spine (10/16/2019) 03/13/2022   Radiculopathy of cervical region 11/05/2021   Spinal stenosis of cervical region 11/05/2021   Gouty arthritis of great toe (Right) 06/06/2021   Intervertebral disc disorder with radiculopathy  of lumbar region 04/30/2021   Thoracic spine pain 04/30/2021   Osteopenia of multiple sites 03/25/2021   Secondary hyperparathyroidism of renal origin ([place]) 03/25/2021   BMI 32.0-32.9,adult 09/14/2020   Spondylosis of lumbar region without myelopathy or radiculopathy 09/14/2020   Stage 3a chronic kidney disease ([place]) 07/11/2020   Intervertebral disc stenosis of neural canal of cervical region 08/30/2019   SOBOE (shortness of breath on exertion) 06/13/2019   PVC (premature ventricular contraction) 12/29/2018   Chronic low  back pain (Bilateral) (L>R) w/ sciatica (Bilateral) (L>R) 03/16/2017   Sciatica 03/16/2017   Gastroesophageal reflux disease 11/17/2016   Gastroesophageal reflux disease with esophagitis 11/13/2016   Benign essential hypertension 09/18/2015   Mixed hyperlipidemia 12/06/2014   Palpitations 12/06/2014   History of melanoma 11/20/2014   Mitral valve prolapse 11/16/2013   Basal cell carcinoma 10/21/2013   History of basal cell cancer 10/21/2013   Acquired hypothyroidism 09/12/2013   Anxiety 09/12/2013   Increased frequency of urination 09/12/2013    PCP: Hortencia Pilar, MD   REFERRING PROVIDER: Milinda Pointer, MD   REFERRING DIAGNOSIS:  M47.816 (ICD-10-CM) - Spondylosis without myelopathy or radiculopathy, lumbar region  M54.50 (ICD-10-CM) - Low back pain, unspecified  G89.29 (ICD-10-CM) - Other chronic pain  M51.37 (ICD-10-CM) - Other intervertebral disc degeneration, lumbosacral region  M96.1 (ICD-10-CM) - Postlaminectomy syndrome, not elsewhere classified  R93.7 (ICD-10-CM) - Abnormal findings on diagnostic imaging of other parts of musculoskeletal system      THERAPY DIAG: Other low back pain   Chronic pain syndrome   Muscle weakness (generalized)   RATIONALE FOR EVALUATION AND TREATMENT: Rehabilitation   ONSET DATE: 10 years ago with progressive worsening   FOLLOW UP APPT WITH PROVIDER: Yes , pt to f/u with Dr. Dossie Arbour next month      PERTINENT HISTORY: Patient is a 56 year old female with primary complaint of low back pain s/p lumbar laminectomy at L4-5 in 2016; pt reports episodes of low back pain even at younger age. Pt has chronic low back pain with episodic flare-ups with involved history of conservative interventions and eventually L4-5 laminectomy in 2016. Pt is s/p 2 medial branch blocks that did help significantly. Patient has referring diagnosis of lumbar facet syndrome. Pt states her condition is degenerative and she feels that it is not going to get better.  She reports having difficulty with walking very far. Pt reports responding well to heat, but having to back down from this due to overuse and getting superficial burns. Pt works sedentary job. No sciatica; hx of ESI that notably improved sciatic-type symptoms. Patient has comorbid neck pain with Hx of C6-7 retrolisthesis. Pt reports disturbed sleep in the night with lying on her R side; she is able to get more comfortable with lying on L side. Pt denies bowel/bladder changes. No sudden weight change. (+) shopping cart sign.   Pain:  Pain Intensity: Present: 6/10, Best: 4/10, Worst: 8/10 Pain location: Varying between R and L lumbar flank Pain Quality: stabbing Radiating: Yes , varying between R and L flank Numbness/Tingling: Yes, in her feet intermittently Focal Weakness: Yes, weakness in her ankles, Hx of several ankle sprains Aggravating factors: twisting of her back, vacuuming/mopping, prolonged sitting, prolonged standing Relieving factors: lying flat on bed, on the move  24-hour pain behavior: worse in PM How long can you sit: up to 1 hour prior to severe pain  How long can you stand: 10 minutes History of prior back injury, pain, surgery, or therapy: Yes, Hx of lumbar laminectomy in  2016 Falls: Has patient fallen in last 6 months? 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[employer], desk work mainly [place]: going to [employer], walking out in nature Red flags (bowel/bladder changes, saddle paresthesia, personal history of cancer, h/o spinal tumors, h/o compression fx, h/o abdominal aneurysm, abdominal pain, chills/fever, night sweats, nausea, vomiting, unrelenting pain, first onset of insidious LBP <20 y/o): Positive for skin cancer   Precautions: Osteopenia   Weight Bearing Restrictions: No   Living Environment Lives with: lives with their  spouse Lives in: House/apartment Has following equipment at home: None     Patient Goals: More strength, more stamina for performing activity     OBJECTIVE: (objective measures completed at initial evaluation unless otherwise dated)  Patient Surveys  FOTO 30, predicted score of 14   Cognition Patient is oriented to person, place, and time.  Recent memory is intact.  Remote memory is intact.  Attention span and concentration are intact.  Expressive speech is intact.  Patient's fund of knowledge is within normal limits for educational level.                            Gross Musculoskeletal Assessment Tremor: None Bulk: Normal No visible step-off along spinal column, no signs of scoliosis     GAIT: Unremarkable     Posture: Lumbar lordosis: Decreased Thoracic kyphosis: Increased Forward head, rounded shoulders Lumbar lateral shift: Negative     AROM       AROM (Normal range in degrees) AROM  09/03/2022  Lumbar    Flexion (65) 50%*   Extension (30) <25%*  Right lateral flexion (25) 50%*   Left lateral flexion (25) 50%*   Right rotation (30) 25%*  Left rotation (30) 25%*         Hip Right Left  Flexion (125) WNL* WNL*  Extension (15)      Abduction (40)      Adduction       Internal Rotation (45)      External Rotation (45) WNL WNL         Knee      Flexion (135)      Extension (0)             Ankle      Dorsiflexion (20)      Plantarflexion (50)      Inversion (35)      Eversion (15      (* = pain; Blank rows = not tested)     LE MMT:   MMT (out of 5) Right 09/03/2022 Left 09/03/2022  Hip flexion 4* 4+*  Hip extension      Hip abduction 4* 4*  Hip adduction 5 5  Hip internal rotation      Hip external rotation      Knee flexion 4+ 4+  Knee extension 4+ 4+  Ankle dorsiflexion 5 5  Ankle plantarflexion      Ankle inversion      Ankle eversion      (* = pain; Blank rows = not tested)     Sensation Grossly intact to light touch  bilateral LEs as determined by testing dermatomes L2-S2. Proprioception, and hot/cold testing deferred on this date.     Reflexes R/L Knee Jerk (L3/4): 2+/2+  Ankle Jerk (S1/2): 2+/2+  UMN signs (-)    Muscle Length Hamstrings: R: 20 deg knee extension lacking,  L: Positive for pain prior to tissue restriction; 30 deg knee extension lacking [place] (quadriceps): R: Not examined L: Not examined     Palpation   Location LEFT  RIGHT           Lumbar paraspinals 2 2  Quadratus Lumborum      Gluteus Maximus 1 1  Gluteus Medius 1 2  Deep hip external rotators      PSIS 1 1  Fortin's Area (SIJ) 1 1  Greater Trochanter      (Blank rows = not tested) Graded on 0-4 scale (0 = no pain, 1 = pain, 2 = pain with wincing/grimacing/flinching, 3 = pain with withdrawal, 4 = unwilling to allow palpation), (Blank rows = not tested)     Passive Accessory Intervertebral Motion Reproduction of back pain with CPA L1-S1 and UPA bilaterally L1-L5, pain prior to restriction   SPECIAL TESTS Lumbar Radiculopathy and Discogenic: Centralization and Peripheralization (SN 92, -LR 0.12): Not examined Slump (SN 83, -LR 0.32): R: Negative L: Positive SLR (SN 92, -LR 0.29): R: Positive L:  Positive Crossed SLR (SP 90): R: Not examined L: Negative General lumbar distraction: Negative Compression: Negative    Facet Joint: Extension-Rotation (SN 100, -LR 0.0): R: Not done L: Not done   Lumbar Foraminal Stenosis: Lumbar quadrant (SN 70): R: Positive L: Positive   SIJ:  SI Compression: Positive          TODAY'S TREATMENT  SUBJECTIVE: Pt reports more tension in pericapsular and paraspinal muscles with having to pack for her trip (leaving in the AM) and being more anxious about this. Patient feels that low back is "not that bad at arrival." Patient has significant stressors related to work.    PAIN:  Are you having pain? Yes: NPRS scale: 5/10 Pain location: Across low back, mainly R side   Moist Hot  Pack (unbilled) in prone prior to manual therapy; along low back and periscapular mm while lying in prone - for pain modulation and soft tissue mobility/extensibility. X 5 minutes in prone lying   Manual Therapy - for symptom modulation, soft tissue sensitivity and mobility, joint mobility, ROM.    In supine: Bilateral long-leg distraction for lumbar nerve root decompression and pain modulation; intermittent, 10 sec on, 5 sec off; x 5 minutes   *not today* Initiated lumbar spine CPA, held due to significant pain with L1-L5 PA mobilization   In prone:  IASTM with Hypervolt to bilateral lumbar paraspinals for 10 minutes to improve tissues extensibility, CNS pain sensitization.  CPA grade 2 T5-T12 for symptom modulation 2x30 each segment, grade 3 at T7-T12 for thoracic mobility 1x30 sec each segment     Therapeutic Exercise - for improved soft tissue flexibility and extensibility as needed for ROM, neurodynamics to improve neural/dural tissue mobility and reduce pain related to adherent nerve root, graded movement to improve tolerance of accessing thoracolumbar ROM    NuStep; x 5 minutes, L2; for nervous system down-regulation/decreased central sensitivity - subjective information discussed and pain science concepts reviewed during this time as well as anticipated progression of aerobic activity    Cat Camel; x10 ea dir, quadruped     Quadruped sidebend, alternating R/L; x10 ea dir    Posterior pelvic tilts: 3x10. Min TC's on lumbar spine to attain posterior tilt.    Physioball roller, lumbar flexion and lateral flexion (forward, and diagonal R/L in sitting); x5 ea  dir, 5 sec hold         PATIENT EDUCATION: HEP update and review. Discussed role of graded movement to reduce threat to movement over time and decrease pain experience with challenging movements.      *not today*  Supine sciatic nerve flossing, no reproduction of leg pain or numbness; 1x10  Lower trunk rotations,  hooklying; 1x10 ea dir  Open book, sidelying R and L; x10 ea dir   Gentle glut bridge + posterior tilt: x3. Onset of LBP so discontinued. Regressed to posterior tilt + glut max isometric. X10.   Standing scap retractions at [place]: 1x6, 30#. 2x6, 20#. Decreased weight due to pain.    Seated lat pull down at [place]: 3x6, 30#.   Standing D2 with YTB: x8/UE. VC's to limit thoracic rotation.     PATIENT EDUCATION:  Education details: exercise technique for HEP carryover, pain science education   Person educated: Patient Education method: Explanation Education comprehension: verbalized understanding      HOME EXERCISE PROGRAM: Access Code: [email or web address] URL: [email or web address] Date: 09/06/2022 Prepared by: Valentina Gu  Exercises - Supine Double Knee to Chest  - 2-3 x daily - 7 x weekly - 1-2 sets - 10 reps - 1sec hold - Supine Lower Trunk Rotation  - 2 x daily - 7 x weekly - 1-2 sets - 10 reps - 1sec hold - Sidelying Thoracic Rotation with Open Book  - 2 x daily - 7 x weekly - 1-2 sets - 10 reps  Patient Education - What Is Pain?   ASSESSMENT:   CLINICAL IMPRESSION: Patient is able to tolerate activity including stationary biking for light aerobic work today without significant exacerbation of symptoms. Pt is able to progress volume of thoracolumbar mobility work on table with gravity-eliminated positions with minimal effect on pain. Pt does have good response with manual lumbar traction today - utilized to enable pt to feel more comfortable with modestly progressing exercise. Will continue progressing movement in upright positions and ease into standing activity as tolerated.  Pt will continue to benefit from skilled PT services to continue to address deficits and pain to improve functional mobility and QoL.    REHAB POTENTIAL: Fair chronic pain with involved interventional history, complicated MSK pain history, comorbiditites   CLINICAL DECISION MAKING:  Unstable/unpredictable   EVALUATION COMPLEXITY: Moderate     GOALS:   SHORT TERM GOALS: Target date: 09/17/2022   Pt will be independent with HEP to improve strength and decrease back pain to improve pain-free function at home and work. Baseline: 09/02/22: Baseline HEP to be initiated on visit # 2.  Goal status: INITIAL     LONG TERM GOALS: Target date: 10/16/2022   Pt will increase FOTO to at least 46 to demonstrate significant improvement in function at home and work related to back pain  Baseline: 09/02/22: 30 Goal status: INITIAL   2.  Pt will decrease worst back pain by at least 2 points on the NPRS in order to demonstrate clinically significant reduction in back pain. Baseline: 09/02/22: 8/10 pain at worst Goal status: INITIAL   3.  Pt will have thoracolumbar AROM at least 75% or greater for all motions without increase in baseline pain as needed for reaching, self-care ADLs, bending to retrive item       Baseline: 09/02/22: Pain and significant motion loss with all directions Goal status: INITIAL   4.  Patient will tolerate standing/ambulatory activity up to 30 minutes without increase in baseline pain as needed for  improved ability to complete community outings, cooking/cleaning, and leisure activities with her family Baseline: 09/02/22: Pt tolerates standing up to 10 minutes Goal status: INITIAL   5.  Patient will tolerate sitting up to 1 hour or greater without increase in baseline pain as needed for improved tolerance of leisure activity with her spouse and family and driving/riding in vehicle Baseline: 09/02/22: Severe pain following 1 hour of sitting Goal status: INITIAL     PLAN: PT FREQUENCY: 2x/week   PT DURATION: 6 weeks   PLANNED INTERVENTIONS: Therapeutic exercises, Therapeutic activity, Neuromuscular re-education,  Patient/Family education, Joint mobilization, Aquatic Therapy, Dry Needling, Electrical stimulation, Spinal mobilization, Cryotherapy, Moist heat, and  Manual therapy   PLAN FOR NEXT SESSION: Pain neuroscience/persistent pain education, review normal asymptomatic imaging findings. Manual therapy and modalities as needed for pain, graded thoracolumbar and hip movement as tolerated. Utilize aerobic exercise for nervous system down-regulation.   Valentina Gu, PT, DPT #[id number]  Eilleen Kempf 10/07/2022, 3:25 PM

## 2022-10-13 ENCOUNTER — Ambulatory Visit: Payer: BC Managed Care – PPO | Admitting: Physical Therapy

## 2022-10-15 ENCOUNTER — Ambulatory Visit: Payer: BC Managed Care – PPO | Attending: Pain Medicine | Admitting: Physical Therapy

## 2022-10-15 DIAGNOSIS — G894 Chronic pain syndrome: Secondary | ICD-10-CM | POA: Insufficient documentation

## 2022-10-15 DIAGNOSIS — M6281 Muscle weakness (generalized): Secondary | ICD-10-CM | POA: Insufficient documentation

## 2022-10-15 DIAGNOSIS — M5459 Other low back pain: Secondary | ICD-10-CM | POA: Insufficient documentation

## 2022-10-15 NOTE — Therapy (Signed)
OUTPATIENT PHYSICAL THERAPY TREATMENT NOTE   Patient Name: Donna Cannon MRN: [id number] DOB:February 03, 1966, 56 y.o., female Today's Date: 10/18/2022    END OF SESSION:   PT End of Session - 10/18/22 0740     Visit Number 8    Number of Visits 13    Date for PT Re-Evaluation 10/16/22    Authorization Type BCBC 2023, 30 visit limit for PT/OT combined per year    Progress Note Due on Visit 10    PT Start Time 1548    PT Stop Time 1630    PT Time Calculation (min) 42 min    Activity Tolerance Patient limited by pain;Patient tolerated treatment well    Behavior During Therapy WFL for tasks assessed/performed                 Past Medical History:  Diagnosis Date   Anxiety    Arthritis    Cancer ([place])    melanoma   GERD (gastroesophageal reflux disease)    Hypertension    Hypothyroidism    Mitral valve prolapse    " mild"   PONV (postoperative nausea and vomiting)    Sacroiliac dysfunction    left   Spinal headache    Wears glasses    Past Surgical History:  Procedure Laterality Date   BACK SURGERY     lumbar decompression   CESAREAN SECTION     COLONOSCOPY W/ BIOPSIES AND POLYPECTOMY     ENDOMETRIAL ABLATION     ESOPHAGOGASTRODUODENOSCOPY (EGD) WITH PROPOFOL N/A 12/12/2016   Procedure: ESOPHAGOGASTRODUODENOSCOPY (EGD) WITH PROPOFOL;  Surgeon: Jonathon Bellows, MD;  Location: [facility] ENDOSCOPY;  Service: Endoscopy;  Laterality: N/A;   TUBAL LIGATION     Patient Active Problem List   Diagnosis Date Noted   Lumbosacral facet arthropathy (Multilevel) (Bilateral) 03/17/2022   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 03/17/2022   Lumbar foraminal stenosis (L4-5) (Bilateral) 03/17/2022   DDD (degenerative disc disease), lumbosacral 03/17/2022   Cervical facet hypertrophy 03/17/2022   DDD (degenerative disc disease), cervical 03/17/2022   Chronic feet pain (5th area of Pain) (Bilateral) 03/17/2022   Chronic wrist pain (7th area of Pain) (Right)  03/17/2022   Chronic low back pain (1ry area of Pain) (Bilateral) (L>R) w/o sciatica 03/17/2022   Failed back surgical syndrome (2015) 03/17/2022   Chronic lower extremity pain (2ry area of Pain) (Bilateral) (L>R) 03/17/2022   Chronic lower extremity radicular pain (S1 dermatome) (Bilateral) 03/17/2022   Chronic upper extremity pain (3ry area of Pain) (Left) 03/17/2022   Chronic neck pain (4th area of Pain) (Posterior) (Bilateral) (L>R) 03/17/2022   Chronic ankle pain (6th area of Pain) (Bilateral) 03/17/2022   Grade 1 Retrolisthesis of cervical spine (C3/C4 and C5/C6) 03/17/2022   Decreased range of motion of lumbar spine 03/17/2022   Painful cervical range of motion 03/17/2022   Impaired range of motion of cervical spine 03/17/2022   Lumbar facet syndrome 03/17/2022   Chronic hip pain (Bilateral) 03/17/2022   Chronic sacroiliac joint pain (Bilateral) 03/17/2022   Decreased GFR 03/17/2022   Elevated liver enzymes 03/17/2022   Chronic pain syndrome 03/13/2022   Pharmacologic therapy 03/13/2022   Disorder of skeletal system 03/13/2022   Problems influencing health status 03/13/2022   Abnormal MRI, cervical spine (11/18/2021) 03/13/2022   Abnormal MRI, lumbar spine (10/16/2019) 03/13/2022   Radiculopathy of cervical region 11/05/2021   Spinal stenosis of cervical region 11/05/2021   Gouty arthritis of great toe (Right) 06/06/2021   Intervertebral disc disorder with  radiculopathy of lumbar region 04/30/2021   Thoracic spine pain 04/30/2021   Osteopenia of multiple sites 03/25/2021   Secondary hyperparathyroidism of renal origin ([place]) 03/25/2021   BMI 32.0-32.9,adult 09/14/2020   Spondylosis of lumbar region without myelopathy or radiculopathy 09/14/2020   Stage 3a chronic kidney disease ([place]) 07/11/2020   Intervertebral disc stenosis of neural canal of cervical region 08/30/2019   SOBOE (shortness of breath on exertion) 06/13/2019   PVC (premature ventricular contraction) 12/29/2018    Chronic low back pain (Bilateral) (L>R) w/ sciatica (Bilateral) (L>R) 03/16/2017   Sciatica 03/16/2017   Gastroesophageal reflux disease 11/17/2016   Gastroesophageal reflux disease with esophagitis 11/13/2016   Benign essential hypertension 09/18/2015   Mixed hyperlipidemia 12/06/2014   Palpitations 12/06/2014   History of melanoma 11/20/2014   Mitral valve prolapse 11/16/2013   Basal cell carcinoma 10/21/2013   History of basal cell cancer 10/21/2013   Acquired hypothyroidism 09/12/2013   Anxiety 09/12/2013   Increased frequency of urination 09/12/2013    PCP: Hortencia Pilar, MD   REFERRING PROVIDER: Milinda Pointer, MD   REFERRING DIAGNOSIS:  M47.816 (ICD-10-CM) - Spondylosis without myelopathy or radiculopathy, lumbar region  M54.50 (ICD-10-CM) - Low back pain, unspecified  G89.29 (ICD-10-CM) - Other chronic pain  M51.37 (ICD-10-CM) - Other intervertebral disc degeneration, lumbosacral region  M96.1 (ICD-10-CM) - Postlaminectomy syndrome, not elsewhere classified  R93.7 (ICD-10-CM) - Abnormal findings on diagnostic imaging of other parts of musculoskeletal system      THERAPY DIAG: Other low back pain   Chronic pain syndrome   Muscle weakness (generalized)   RATIONALE FOR EVALUATION AND TREATMENT: Rehabilitation   ONSET DATE: 10 years ago with progressive worsening   FOLLOW UP APPT WITH PROVIDER: Yes , pt to f/u with Dr. Dossie Arbour next month      PERTINENT HISTORY: Patient is a 56 year old female with primary complaint of low back pain s/p lumbar laminectomy at L4-5 in 2016; pt reports episodes of low back pain even at younger age. Pt has chronic low back pain with episodic flare-ups with involved history of conservative interventions and eventually L4-5 laminectomy in 2016. Pt is s/p 2 medial branch blocks that did help significantly. Patient has referring diagnosis of lumbar facet syndrome. Pt states her condition is degenerative and she feels that it is not going to  get better. She reports having difficulty with walking very far. Pt reports responding well to heat, but having to back down from this due to overuse and getting superficial burns. Pt works sedentary job. No sciatica; hx of ESI that notably improved sciatic-type symptoms. Patient has comorbid neck pain with Hx of C6-7 retrolisthesis. Pt reports disturbed sleep in the night with lying on her R side; she is able to get more comfortable with lying on L side. Pt denies bowel/bladder changes. No sudden weight change. (+) shopping cart sign.   Pain:  Pain Intensity: Present: 6/10, Best: 4/10, Worst: 8/10 Pain location: Varying between R and L lumbar flank Pain Quality: stabbing Radiating: Yes , varying between R and L flank Numbness/Tingling: Yes, in her feet intermittently Focal Weakness: Yes, weakness in her ankles, Hx of several ankle sprains Aggravating factors: twisting of her back, vacuuming/mopping, prolonged sitting, prolonged standing Relieving factors: lying flat on bed, on the move  24-hour pain behavior: worse in PM How long can you sit: up to 1 hour prior to severe pain  How long can you stand: 10 minutes History of prior back injury, pain, surgery, or therapy: Yes, Hx of lumbar laminectomy  in 2016 Falls: Has patient fallen in last 6 months? 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[employer], desk work mainly [place]: going to [employer], walking out in nature Red flags (bowel/bladder changes, saddle paresthesia, personal history of cancer, h/o spinal tumors, h/o compression fx, h/o abdominal aneurysm, abdominal pain, chills/fever, night sweats, nausea, vomiting, unrelenting pain, first onset of insidious LBP <18 y/o): Positive for skin cancer   Precautions: Osteopenia   Weight Bearing Restrictions: No   Living Environment Lives with: lives  with their spouse Lives in: House/apartment Has following equipment at home: None     Patient Goals: More strength, more stamina for performing activity     OBJECTIVE: (objective measures completed at initial evaluation unless otherwise dated)  Patient Surveys  FOTO 30, predicted score of 42   Cognition Patient is oriented to person, place, and time.  Recent memory is intact.  Remote memory is intact.  Attention span and concentration are intact.  Expressive speech is intact.  Patient's fund of knowledge is within normal limits for educational level.                            Gross Musculoskeletal Assessment Tremor: None Bulk: Normal No visible step-off along spinal column, no signs of scoliosis     GAIT: Unremarkable     Posture: Lumbar lordosis: Decreased Thoracic kyphosis: Increased Forward head, rounded shoulders Lumbar lateral shift: Negative     AROM       AROM (Normal range in degrees) AROM  09/03/2022  Lumbar    Flexion (65) 50%*   Extension (30) <25%*  Right lateral flexion (25) 50%*   Left lateral flexion (25) 50%*   Right rotation (30) 25%*  Left rotation (30) 25%*         Hip Right Left  Flexion (125) WNL* WNL*  Extension (15)      Abduction (40)      Adduction       Internal Rotation (45)      External Rotation (45) WNL WNL         Knee      Flexion (135)      Extension (0)             Ankle      Dorsiflexion (20)      Plantarflexion (50)      Inversion (35)      Eversion (15      (* = pain; Blank rows = not tested)     LE MMT:   MMT (out of 5) Right 09/03/2022 Left 09/03/2022  Hip flexion 4* 4+*  Hip extension      Hip abduction 4* 4*  Hip adduction 5 5  Hip internal rotation      Hip external rotation      Knee flexion 4+ 4+  Knee extension 4+ 4+  Ankle dorsiflexion 5 5  Ankle plantarflexion      Ankle inversion      Ankle eversion      (* = pain; Blank rows = not tested)     Sensation Grossly intact to light  touch bilateral LEs as determined by testing dermatomes L2-S2. Proprioception, and hot/cold testing deferred on this date.     Reflexes R/L Knee Jerk (L3/4): 2+/2+  Ankle Jerk (S1/2): 2+/2+  UMN signs (-)    Muscle Length Hamstrings: R: 20 deg knee extension lacking,  L: Positive for pain prior to tissue restriction; 30 deg knee extension lacking [place] (quadriceps): R: Not examined L: Not examined     Palpation   Location LEFT  RIGHT           Lumbar paraspinals 2 2  Quadratus Lumborum      Gluteus Maximus 1 1  Gluteus Medius 1 2  Deep hip external rotators      PSIS 1 1  Fortin's Area (SIJ) 1 1  Greater Trochanter      (Blank rows = not tested) Graded on 0-4 scale (0 = no pain, 1 = pain, 2 = pain with wincing/grimacing/flinching, 3 = pain with withdrawal, 4 = unwilling to allow palpation), (Blank rows = not tested)     Passive Accessory Intervertebral Motion Reproduction of back pain with CPA L1-S1 and UPA bilaterally L1-L5, pain prior to restriction   SPECIAL TESTS Lumbar Radiculopathy and Discogenic: Centralization and Peripheralization (SN 92, -LR 0.12): Not examined Slump (SN 83, -LR 0.32): R: Negative L: Positive SLR (SN 92, -LR 0.29): R: Positive L:  Positive Crossed SLR (SP 90): R: Not examined L: Negative General lumbar distraction: Negative Compression: Negative    Facet Joint: Extension-Rotation (SN 100, -LR 0.0): R: Not done L: Not done   Lumbar Foraminal Stenosis: Lumbar quadrant (SN 70): R: Positive L: Positive   SIJ:  SI Compression: Positive          TODAY'S TREATMENT  SUBJECTIVE: Pt reports pain along R gluteal region primarily at arrival to therapy. She reports symptoms have been "not that bad" today in spite of colder weather. She reports 5/10 moderate-intensity pain similar to her baseline level of symptoms. Pt will be undergoing anorectecal exam the week after next; she has to work full days next week and won't be able to attend PT during the  work week then.   PAIN:  Are you having pain? Yes: NPRS scale: 5/10 Pain location: Periscapular musculature > low back   Moist Hot Pack (unbilled) prior to manual therapy; along low back and periscapular mm while lying in prone - for pain modulation and soft tissue mobility/extensibility. X 5 minutes in prone lying   Manual Therapy - for symptom modulation, soft tissue sensitivity and mobility, joint mobility, ROM.    IASTM with Hypervolt to bilateral lumbar paraspinals and R gluteus medius/maximus for 15 minutes to improve tissues extensibility, CNS pain sensitization.   RLE long-leg distraction gr II for symptom modulation; x 2 minutes, intermittent pull 10 sec on, 5 sec off  Passive R piriformis stretch; 2x30sec    *not today* In supine: Bilateral long-leg distraction for lumbar nerve root decompression and pain modulation; intermittent, 10 sec on, 5 sec off; x 5 minutes Initiated lumbar spine CPA, held due to significant pain with L1-L5 PA mobilization   In prone: CPA grade 2 T5-T12 for symptom modulation 2x30 each segment, grade 3 at T7-T12 for thoracic mobility 1x30 sec each segment     Therapeutic Exercise - for improved soft tissue flexibility and extensibility as needed for ROM, neurodynamics to improve neural/dural tissue mobility and reduce pain related to adherent nerve root, graded movement to improve tolerance of accessing thoracolumbar ROM    NuStep; x 5 minutes, L3; for nervous system down-regulation/decreased central sensitivity - subjective information discussed and pain science concepts reviewed during this time as well as anticipated progression of aerobic activity   Piriformis stretch;  x20, 1 sec hold    Posterior pelvic tilts: 3x10. Min TC's on lumbar spine to attain posterior tilt.    Physioball rollout, lumbar flexion and lateral flexion (forward, and diagonal R/L in sitting); x5 ea dir, 5 sec hold      PATIENT EDUCATION: HEP update and review.  Overviewed contribution of psychological factors to pain experience and discussed anxiety management and conjunct use of box breathing for increased parasympathetic activity.      *not today*  Quadruped sidebend, alternating R/L; x10 ea dir   Cat Camel; x10 ea dir, quadruped   Supine sciatic nerve flossing, no reproduction of leg pain or numbness; 1x10  Lower trunk rotations, hooklying; 1x10 ea dir  Open book, sidelying R and L; x10 ea dir   Gentle glut bridge + posterior tilt: x3. Onset of LBP so discontinued. Regressed to posterior tilt + glut max isometric. X10.   Standing scap retractions at [place]: 1x6, 30#. 2x6, 20#. Decreased weight due to pain.    Seated lat pull down at [place]: 3x6, 30#.   Standing D2 with YTB: x8/UE. VC's to limit thoracic rotation.     PATIENT EDUCATION:  Education details: exercise technique for HEP carryover, pain science education   Person educated: Patient Education method: Explanation Education comprehension: verbalized understanding      HOME EXERCISE PROGRAM: Access Code: [email or web address] URL: [email or web address] Date: 10/18/2022 Prepared by: Valentina Gu  Exercises - Supine Double Knee to Chest  - 2-3 x daily - 7 x weekly - 1-2 sets - 10 reps - 1sec hold - Supine Lower Trunk Rotation  - 2 x daily - 7 x weekly - 1-2 sets - 10 reps - 1sec hold - Sidelying Thoracic Rotation with Open Book  - 2 x daily - 7 x weekly - 1-2 sets - 10 reps - Supine 90/90 Sciatic Nerve Glide with Knee Flexion/Extension  - 2 x daily - 7 x weekly - 2 sets - 10 reps - 1sec hold - Supine Piriformis Stretch with Foot on Ground  - 2 x daily - 7 x weekly - 1 sets - 20 reps - 1sec hold - Supine Pelvic Tilt  - 2 x daily - 7 x weekly - 2 sets - 10 reps   ASSESSMENT:   CLINICAL IMPRESSION: Patient has improved ability to perform activity  and thoracolumbar AROM in clinic without significant spike in NPRS. She has remaining pain in moderate range (around 5/10  NPRS usually). 0-1/10 pain may be difficult to attain given likely contribution of central sensitization and chronic pain s/p lumbar laminectomy. Patient does have ongoing positional intolerance for prolonged sitting and prolonged standing as well as household chores requiring trunk flexion (e.g. vacuuming). Pt has been open to concepts of PNE and role of graded movement and education for decreasing influence of "pain alarm" associated with central sensitization. Pt has made fair progress in spite of ongoing moderate (with intermittently severe) pain in low back. Pt has upcoming GI exploratory procedure and will be out of PT for at least 3-4 weeks following this. Pt may resume activity and active PT following clearance of her physician. Pt will continue to benefit from skilled PT services to continue to address deficits and pain to improve functional mobility and QoL.    REHAB POTENTIAL: Fair chronic pain with involved interventional history, complicated MSK pain history, comorbiditites   CLINICAL DECISION MAKING: Unstable/unpredictable   EVALUATION COMPLEXITY: Moderate     GOALS:   SHORT TERM GOALS: Target date: 09/17/2022  Pt will be independent with HEP to improve strength and decrease back pain to improve pain-free function at home and work. Baseline: 09/02/22: Baseline HEP to be initiated on visit # 2.  Goal status: INITIAL     LONG TERM GOALS: Target date: 10/16/2022   Pt will increase FOTO to at least 46 to demonstrate significant improvement in function at home and work related to back pain  Baseline: 09/02/22: 30 Goal status: INITIAL   2.  Pt will decrease worst back pain by at least 2 points on the NPRS in order to demonstrate clinically significant reduction in back pain. Baseline: 09/02/22: 8/10 pain at worst Goal status: INITIAL   3.  Pt will have thoracolumbar AROM at least 75% or greater for all motions without increase in baseline pain as needed for reaching, self-care ADLs,  bending to retrive item       Baseline: 09/02/22: Pain and significant motion loss with all directions Goal status: INITIAL   4.  Patient will tolerate standing/ambulatory activity up to 30 minutes without increase in baseline pain as needed for improved ability to complete community outings, cooking/cleaning, and leisure activities with her family Baseline: 09/02/22: Pt tolerates standing up to 10 minutes Goal status: INITIAL   5.  Patient will tolerate sitting up to 1 hour or greater without increase in baseline pain as needed for improved tolerance of leisure activity with her spouse and family and driving/riding in vehicle Baseline: 09/02/22: Severe pain following 1 hour of sitting Goal status: INITIAL     PLAN: PT FREQUENCY: 2x/week   PT DURATION: 6 weeks   PLANNED INTERVENTIONS: Therapeutic exercises, Therapeutic activity, Neuromuscular re-education,  Patient/Family education, Joint mobilization, Aquatic Therapy, Dry Needling, Electrical stimulation, Spinal mobilization, Cryotherapy, Moist heat, and Manual therapy   PLAN FOR NEXT SESSION: Pain neuroscience/persistent pain education, review normal asymptomatic imaging findings. Manual therapy and modalities as needed for pain, graded thoracolumbar and hip movement as tolerated. Utilize aerobic exercise for nervous system down-regulation. Continue PT pending resumption of activity following anorectal exam.   Valentina Gu, PT, DPT [id number]  Eilleen Kempf 10/18/2022, 7:41 AM

## 2022-10-18 ENCOUNTER — Encounter: Payer: Self-pay | Admitting: Physical Therapy

## 2022-12-30 ENCOUNTER — Other Ambulatory Visit (HOSPITAL_BASED_OUTPATIENT_CLINIC_OR_DEPARTMENT_OTHER): Payer: BC Managed Care – PPO | Admitting: Pain Medicine

## 2022-12-30 ENCOUNTER — Telehealth: Payer: Self-pay

## 2022-12-30 DIAGNOSIS — M47816 Spondylosis without myelopathy or radiculopathy, lumbar region: Secondary | ICD-10-CM

## 2022-12-30 DIAGNOSIS — G8929 Other chronic pain: Secondary | ICD-10-CM

## 2022-12-30 DIAGNOSIS — R937 Abnormal findings on diagnostic imaging of other parts of musculoskeletal system: Secondary | ICD-10-CM

## 2022-12-30 DIAGNOSIS — M47817 Spondylosis without myelopathy or radiculopathy, lumbosacral region: Secondary | ICD-10-CM

## 2022-12-30 DIAGNOSIS — M5137 Other intervertebral disc degeneration, lumbosacral region: Secondary | ICD-10-CM

## 2022-12-30 DIAGNOSIS — M545 Low back pain, unspecified: Secondary | ICD-10-CM

## 2022-12-30 NOTE — Telephone Encounter (Signed)
Wouldn't she need another appointment with current notes?

## 2022-12-30 NOTE — Telephone Encounter (Signed)
She has done 8 visits of PT and wants to try to get the RFA approved now. Can you put in a new order?

## 2022-12-30 NOTE — Telephone Encounter (Signed)
He put in an order. Im going to try anyway

## 2022-12-30 NOTE — Progress Notes (Signed)
The patient indicates having completed her physical therapy, but still having quite a bit of pain and she wants to proceed with the radiofrequency ablation.  Planned  Pending:   Therapeutic bilateral lumbar facet RFA #1 (starting with the left side and following with the right 2-6 weeks later)

## 2023-01-18 NOTE — Progress Notes (Unsigned)
PROVIDER NOTE: Interpretation of information contained herein should be left to medically-trained personnel. Specific patient instructions are provided elsewhere under "Patient Instructions" section of medical record. This document was created in part using STT-dictation technology, any transcriptional errors that may result from this process are unintentional.  Patient: Donna Cannon Type: Established DOB: Mar 08, 1966 MRN: [id number] PCP: Hortencia Pilar, MD   Service: Procedure DOS: 01/20/2023 Setting: Ambulatory Location: Ambulatory outpatient facility Delivery: Face-to-face Provider: Gaspar Cola, MD Specialty: Interventional Pain Management Specialty designation: 09 Location: Outpatient facility Ref. Prov.: Milinda Pointer, MD   Interventional Therapy     Type: Lumbar Facet, Medial Branch Radiofrequency Ablation (RFA) #1  Laterality: Left (-LT)  Level: L2, L3, L4, L5, and S1 Medial Branch Level(s). These levels will denervate the L3-4, L4-5, and L5-S1 lumbar facet joints.  Imaging: Fluoroscopy-guided         Anesthesia: Local anesthesia (1-2% Lidocaine) Anxiolysis: IV Versed 3.0 mg Sedation: Moderate Sedation Fentanyl 1 mL (50 mcg) DOS: 01/20/2023  Performed by: Gaspar Cola, MD  Purpose: Therapeutic/Palliative Indications: Low back pain severe enough to impact quality of life or function. Indications: 1. Chronic low back pain (1ry area of Pain) (Bilateral) (L>R) w/o sciatica   2. Lumbar facet syndrome   3. Lumbosacral facet arthropathy (Multilevel) (Bilateral)   4. Spondylosis of lumbar region without myelopathy or radiculopathy   5. 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 Abnormal MRI, lumbar spine (10/16/2019)    Donna Cannon has been dealing with the above chronic pain for longer than three months and has either failed to respond, was unable to tolerate, or simply did not get enough benefit from  other more conservative therapies including, but not limited to: 1. Over-the-counter medications 2. Anti-inflammatory medications 3. Muscle relaxants 4. Membrane stabilizers 5. Opioids 6. Physical therapy and/or chiropractic manipulation 7. Modalities (Heat, ice, etc.) 8. Invasive techniques such as nerve blocks. Ms. Crisp has attained more than 50% relief of the pain from a series of diagnostic injections conducted in separate occasions.  Pain Score: Pre-procedure: 6 /10 Post-procedure: 0-No pain/10     Position / Prep / Materials:  Position: Prone  Prep solution: DuraPrep (Iodine Povacrylex [0.7% available iodine] and Isopropyl Alcohol, 74% w/w) Prep Area: Entire Lumbosacral Region (Lower back from mid-thoracic region to end of tailbone and from flank to flank.) Materials:  Tray: RFA (Radiofrequency) tray Needle(s):  Type: RFA (Teflon-coated radiofrequency ablation needles) Gauge (G): 22  Length: Regular (10cm) Qty: 5      Pre-op H&P Assessment:  Donna Cannon is a 57 y.o. (year old), female patient, seen today for interventional treatment. She  has a past surgical history that includes Cesarean section; Tubal ligation; Endometrial ablation; Back surgery; Colonoscopy w/ biopsies and polypectomy; and Esophagogastroduodenoscopy (egd) with propofol (N/A, 12/12/2016). Donna Cannon has a current medication list which includes the following prescription(s): allopurinol, calcium carbonate, cholecalciferol, losartan, metamucil fiber, metoprolol succinate, oxycodone-acetaminophen, [START ON 01/27/2023] oxycodone-acetaminophen, pantoprazole, senna-docusate, cyclobenzaprine, and levothyroxine, and the following Facility-Administered Medications: fentanyl. Her primarily concern today is the Back Pain  Initial Vital Signs:  Pulse/HCG Rate: 71ECG Heart Rate: 71 (NSR) Temp: (!) 97.1 F (36.2 C) Resp: 18 BP: 130/87 SpO2: 100 %  BMI: Estimated body mass index is 32.28 kg/m as calculated from the  following:   Height as of this encounter: '5\' 6"'$  (1.676 m).   Weight as of this encounter: 200 lb (90.7 kg).  Risk Assessment: Allergies: Reviewed. She  is allergic to hydrocodone.  Allergy Precautions: None required Coagulopathies: Reviewed. None identified.  Blood-thinner therapy: None at this time Active Infection(s): Reviewed. None identified. [name] is afebrile  Site Confirmation: Ms. Oak was asked to confirm the procedure and laterality before marking the site Procedure checklist: Completed Consent: Before the procedure and under the influence of no sedative(s), amnesic(s), or anxiolytics, the patient was informed of the treatment options, risks and possible complications. To fulfill our ethical and legal obligations, as recommended by the American Medical Association's Code of Ethics, I have informed the patient of my clinical impression; the nature and purpose of the treatment or procedure; the risks, benefits, and possible complications of the intervention; the alternatives, including doing nothing; the risk(s) and benefit(s) of the alternative treatment(s) or procedure(s); and the risk(s) and benefit(s) of doing nothing. The patient was provided information about the general risks and possible complications associated with the procedure. These may include, but are not limited to: failure to achieve desired goals, infection, bleeding, organ or nerve damage, allergic reactions, paralysis, and death. In addition, the patient was informed of those risks and complications associated to Spine-related procedures, such as failure to decrease pain; infection (i.e.: Meningitis, epidural or intraspinal abscess); bleeding (i.e.: epidural hematoma, subarachnoid hemorrhage, or any other type of intraspinal or peri-dural bleeding); organ or nerve damage (i.e.: Any type of peripheral nerve, nerve root, or spinal cord injury) with subsequent damage to sensory, motor, and/or autonomic systems, resulting in  permanent pain, numbness, and/or weakness of one or several areas of the body; allergic reactions; (i.e.: anaphylactic reaction); and/or death. Furthermore, the patient was informed of those risks and complications associated with the medications. These include, but are not limited to: allergic reactions (i.e.: anaphylactic or anaphylactoid reaction(s)); adrenal axis suppression; blood sugar elevation that in diabetics may result in ketoacidosis or comma; water retention that in patients with history of congestive heart failure may result in shortness of breath, pulmonary edema, and decompensation with resultant heart failure; weight gain; swelling or edema; medication-induced neural toxicity; particulate matter embolism and blood vessel occlusion with resultant organ, and/or nervous system infarction; and/or aseptic necrosis of one or more joints. Finally, the patient was informed that Medicine is not an exact science; therefore, there is also the possibility of unforeseen or unpredictable risks and/or possible complications that may result in a catastrophic outcome. The patient indicated having understood very clearly. We have given the patient no guarantees and we have made no promises. Enough time was given to the patient to ask questions, all of which were answered to the patient's satisfaction. Ms. Alberts has indicated that she wanted to continue with the procedure. Attestation: I, the ordering provider, attest that I have discussed with the patient the benefits, risks, side-effects, alternatives, likelihood of achieving goals, and potential problems during recovery for the procedure that I have provided informed consent. Date  Time: 01/20/2023  8:13 AM   Pre-Procedure Preparation:  Monitoring: As per clinic protocol. Respiration, ETCO2, SpO2, BP, heart rate and rhythm monitor placed and checked for adequate function Safety Precautions: Patient was assessed for positional comfort and pressure points  before starting the procedure. Time-out: I initiated and conducted the "Time-out" before starting the procedure, as per protocol. The patient was asked to participate by confirming the accuracy of the "Time Out" information. Verification of the correct person, site, and procedure were performed and confirmed by me, the nursing staff, and the patient. "Time-out" conducted as per Joint Commission's Universal Protocol (UP.01.01.01). Time: [id number]  Description of Procedure:          Laterality: Left Levels:  L2, L3, L4, L5, and S1 Medial Branch Level(s). Safety Precautions: Aspiration looking for blood return was conducted prior to all injections. At no point did we inject any substances, as a needle was being advanced. Before injecting, the patient was told to immediately notify me if she was experiencing any new onset of "ringing in the ears, or metallic taste in the mouth". No attempts were made at seeking any paresthesias. Safe injection practices and needle disposal techniques used. Medications properly checked for expiration dates. SDV (single dose vial) medications used. After the completion of the procedure, all disposable equipment used was discarded in the proper designated medical waste containers. Local Anesthesia: Protocol guidelines were followed. The patient was positioned over the fluoroscopy table. The area was prepped in the usual manner. The time-out was completed. The target area was identified using fluoroscopy. A 12-in long, straight, sterile hemostat was used with fluoroscopic guidance to locate the targets for each level blocked. Once located, the skin was marked with an approved surgical skin marker. Once all sites were marked, the skin (epidermis, dermis, and hypodermis), as well as deeper tissues (fat, connective tissue and muscle) were infiltrated with a small amount of a short-acting local anesthetic, loaded on a 10cc syringe with a 25G, 1.5-in  Needle. An appropriate amount of time was  allowed for local anesthetics to take effect before proceeding to the next step. Technical description of process:  Radiofrequency Ablation (RFA) L2 Medial Branch Nerve RFA: The target area for the L2 medial branch is at the junction of the postero-lateral aspect of the superior articular process and the superior, posterior, and medial edge of the transverse process of L3. Under fluoroscopic guidance, a Radiofrequency needle was inserted until contact was made with os over the superior postero-lateral aspect of the pedicular shadow (target area). Sensory and motor testing was conducted to properly adjust the position of the needle. Once satisfactory placement of the needle was achieved, the numbing solution was slowly injected after negative aspiration for blood. 2.0 mL of the nerve block solution was injected without difficulty or complication. After waiting for at least 3 minutes, the ablation was performed. Once completed, the needle was removed intact. L3 Medial Branch Nerve RFA: The target area for the L3 medial branch is at the junction of the postero-lateral aspect of the superior articular process and the superior, posterior, and medial edge of the transverse process of L4. Under fluoroscopic guidance, a Radiofrequency needle was inserted until contact was made with os over the superior postero-lateral aspect of the pedicular shadow (target area). Sensory and motor testing was conducted to properly adjust the position of the needle. Once satisfactory placement of the needle was achieved, the numbing solution was slowly injected after negative aspiration for blood. 2.0 mL of the nerve block solution was injected without difficulty or complication. After waiting for at least 3 minutes, the ablation was performed. Once completed, the needle was removed intact. L4 Medial Branch Nerve RFA: The target area for the L4 medial branch is at the junction of the postero-lateral aspect of the superior articular  process and the superior, posterior, and medial edge of the transverse process of L5. Under fluoroscopic guidance, a Radiofrequency needle was inserted until contact was made with os over the superior postero-lateral aspect of the pedicular shadow (target area). Sensory and motor testing was conducted to properly adjust the position of the needle. Once satisfactory placement  of the needle was achieved, the numbing solution was slowly injected after negative aspiration for blood. 2.0 mL of the nerve block solution was injected without difficulty or complication. After waiting for at least 3 minutes, the ablation was performed. Once completed, the needle was removed intact. L5 Medial Branch Nerve RFA: The target area for the L5 medial branch is at the junction of the postero-lateral aspect of the superior articular process of S1 and the superior, posterior, and medial edge of the sacral ala. Under fluoroscopic guidance, a Radiofrequency needle was inserted until contact was made with os over the superior postero-lateral aspect of the pedicular shadow (target area). Sensory and motor testing was conducted to properly adjust the position of the needle. Once satisfactory placement of the needle was achieved, the numbing solution was slowly injected after negative aspiration for blood. 2.0 mL of the nerve block solution was injected without difficulty or complication. After waiting for at least 3 minutes, the ablation was performed. Once completed, the needle was removed intact. S1 Medial Branch Nerve RFA: The target area for the S1 medial branch is located inferior to the junction of the S1 superior articular process and the L5 inferior articular process, posterior, inferior, and lateral to the 6 o'clock position of the L5-S1 facet joint, just superior to the S1 posterior foramen. Under fluoroscopic guidance, the Radiofrequency needle was advanced until contact was made with os over the Target area. Sensory and motor  testing was conducted to properly adjust the position of the needle. Once satisfactory placement of the needle was achieved, the numbing solution was slowly injected after negative aspiration for blood. 2.0 mL of the nerve block solution was injected without difficulty or complication. After waiting for at least 3 minutes, the ablation was performed. Once completed, the needle was removed intact. Radiofrequency lesioning (ablation):  Radiofrequency Generator: Medtronic AccurianTM AG 1000 RF Generator Sensory Stimulation Parameters: 50 Hz was used to locate & identify the nerve, making sure that the needle was positioned such that there was no sensory stimulation below 0.3 V or above 0.7 V. Motor Stimulation Parameters: 2 Hz was used to evaluate the motor component. Care was taken not to lesion any nerves that demonstrated motor stimulation of the lower extremities at an output of less than 2.5 times that of the sensory threshold, or a maximum of 2.0 V. Lesioning Technique Parameters: Standard Radiofrequency settings. (Not bipolar or pulsed.) Temperature Settings: 80 degrees C Lesioning time: 60 seconds Intra-operative Compliance: Compliant  Once the entire procedure was completed, the treated area was cleaned, making sure to leave some of the prepping solution back to take advantage of its long term bactericidal properties.    Illustration of the posterior view of the lumbar spine and the posterior neural structures. Laminae of L2 through S1 are labeled. DPRL5, dorsal primary ramus of L5; DPRS1, dorsal primary ramus of S1; DPR3, dorsal primary ramus of L3; FJ, facet (zygapophyseal) joint L3-L4; I, inferior articular process of L4; LB1, lateral branch of dorsal primary ramus of L1; IAB, inferior articular branches from L3 medial branch (supplies L4-L5 facet joint); IBP, intermediate branch plexus; MB3, medial branch of dorsal primary ramus of L3; NR3, third lumbar nerve root; S, superior articular process  of L5; SAB, superior articular branches from L4 (supplies L4-5 facet joint also); TP3, transverse process of L3.  Vitals:   01/20/23 0936 01/20/23 0946 01/20/23 0956 01/20/23 1006  BP: 105/76 114/68 122/73 119/71  Pulse:      Resp: 18 12 12  12  Temp:      TempSrc:      SpO2: 100% 97% 95% 99%  Weight:      Height:        Start Time: 0907 hrs. End Time: 0936 hrs.  Imaging Guidance (Spinal):          Type of Imaging Technique: Fluoroscopy Guidance (Spinal) Indication(s): Assistance in needle guidance and placement for procedures requiring needle placement in or near specific anatomical locations not easily accessible without such assistance. Exposure Time: Please see nurses notes. Contrast: None used. Fluoroscopic Guidance: I was personally present during the use of fluoroscopy. "Tunnel Vision Technique" used to obtain the best possible view of the target area. Parallax error corrected before commencing the procedure. "Direction-depth-direction" technique used to introduce the needle under continuous pulsed fluoroscopy. Once target was reached, antero-posterior, oblique, and lateral fluoroscopic projection used confirm needle placement in all planes. Images permanently stored in EMR. Interpretation: No contrast injected. I personally interpreted the imaging intraoperatively. Adequate needle placement confirmed in multiple planes. Permanent images saved into the patient's record.  Antibiotic Prophylaxis:   Anti-infectives (From admission, onward)    None      Indication(s): None identified  Post-operative Assessment:  Post-procedure Vital Signs:  Pulse/HCG Rate: 7160 Temp: (!) 97.1 F (36.2 C) Resp: 12 BP: 119/71 SpO2: 99 %  EBL: None  Complications: No immediate post-treatment complications observed by team, or reported by patient.  Note: The patient tolerated the entire procedure well. A repeat set of vitals were taken after the procedure and the patient was kept under  observation following institutional policy, for this type of procedure. Post-procedural neurological assessment was performed, showing return to baseline, prior to discharge. The patient was provided with post-procedure discharge instructions, including a section on how to identify potential problems. Should any problems arise concerning this procedure, the patient was given instructions to immediately contact us, at any time, without hesitation. In any case, we plan to contact the patient by telephone for a follow-up status report regarding this interventional procedure.  Comments:  No additional relevant information.  Plan of Care  Orders:  Orders Placed This Encounter  Procedures   Radiofrequency,Lumbar    Scheduling Instructions:     Side(s): Left-sided     Level: L3-4, L4-5, and L5-S1 Facets (L2, L3, L4, L5, and S1 Medial Branch)     Sedation: With Sedation.     Timeframe: Today    Order Specific Question:   Where will this procedure be performed?    Answer:   [facility]   Radiofrequency,Lumbar    Standing Status:   Future    Standing Expiration Date:   04/20/2023    Scheduling Instructions:     Side(s): Right-sided     Level: L3-4, L4-5, and L5-S1 Facets (L2, L3, L4, L5, and S1 Medial Branch)     Sedation: With Sedation.     Scheduling Timeframe: 2 weeks from now    Order Specific Question:   Where will this procedure be performed?    Answer:   [facility]   DG PAIN CLINIC C-ARM 1-60 MIN NO REPORT    Intraoperative interpretation by procedural physician at [place].    Standing Status:   Standing    Number of Occurrences:   1    Order Specific Question:   Reason for exam:    Answer:   Assistance in needle guidance and placement for procedures requiring needle placement in or near specific anatomical locations  not easily accessible without such assistance.   Informed Consent Details: Physician/Practitioner Attestation; Transcribe to consent form and  obtain patient signature    Nursing Order: Transcribe to consent form and obtain patient signature. Note: Always confirm laterality of pain with Ms. Goedde, before procedure.    Order Specific Question:   Physician/Practitioner attestation of informed consent for procedure/surgical case    Answer:   I, the physician/practitioner, attest that I have discussed with the patient the benefits, risks, side effects, alternatives, likelihood of achieving goals and potential problems during recovery for the procedure that I have provided informed consent.    Order Specific Question:   Procedure    Answer:   Lumbar Facet Radiofrequency Ablation    Order Specific Question:   Physician/Practitioner performing the procedure    Answer:   Samora Jernberg A. Dossie Arbour, MD    Order Specific Question:   Indication/Reason    Answer:   Low Back Pain, with our without leg pain, due to Facet Joint Arthralgia (Joint Pain) known as Lumbar Facet Syndrome, secondary to Lumbar, and/or Lumbosacral Spondylosis (Arthritis of the Spine), without myelopathy or radiculopathy (Nerve Damage).   Provide equipment / supplies at bedside    Procedure tray: "Radiofrequency Tray" Additional material: Large hemostat (x1); Small hemostat (x1); Towels (x8); 4x4 sterile sponge pack (x1) Needle type: Teflon-coated Radiofrequency Needle (Disposable  single use) Size: Regular Quantity: 5    Standing Status:   Standing    Number of Occurrences:   1    Order Specific Question:   Specify    Answer:   Radiofrequency Tray   Chronic Opioid Analgesic:  None MME/day: 0 mg/day   Medications ordered for procedure: Meds ordered this encounter  Medications   lidocaine (XYLOCAINE) 2 % (with pres) injection 400 mg   pentafluoroprop-tetrafluoroeth (GEBAUERS) aerosol   lactated ringers infusion   midazolam (VERSED) 5 MG/5ML injection 0.5-2 mg    Make sure Flumazenil is available in the pyxis when using this medication. If oversedation occurs, administer  0.2 mg IV over 15 sec. If after 45 sec no response, administer 0.2 mg again over 1 min; may repeat at 1 min intervals; not to exceed 4 doses (1 mg)   fentaNYL (SUBLIMAZE) injection 25-50 mcg    Make sure Narcan is available in the pyxis when using this medication. In the event of respiratory depression (RR< 8/min): Titrate NARCAN (naloxone) in increments of 0.1 to 0.2 mg IV at 2-3 minute intervals, until desired degree of reversal.   ropivacaine (PF) 2 mg/mL (0.2%) (NAROPIN) injection 9 mL   triamcinolone acetonide (KENALOG-40) injection 40 mg   oxyCODONE-acetaminophen (PERCOCET) 5-325 MG tablet    Sig: Take 1 tablet by mouth every 8 (eight) hours as needed for up to 7 days for severe pain. Must last 7 days.    Dispense:  21 tablet    Refill:  0    For acute post-operative pain. Not to be refilled.  Must last 7 days.   oxyCODONE-acetaminophen (PERCOCET) 5-325 MG tablet    Sig: Take 1 tablet by mouth every 8 (eight) hours as needed for up to 7 days for severe pain. Must last 7 days.    Dispense:  21 tablet    Refill:  0    For acute post-operative pain. Not to be refilled.  Must last 7 days.   Medications administered: We administered lidocaine, pentafluoroprop-tetrafluoroeth, lactated ringers, midazolam, fentaNYL, ropivacaine (PF) 2 mg/mL (0.2%), and triamcinolone acetonide.  See the medical record for exact dosing, route,  and time of administration.  Follow-up plan:   Return in about 2 weeks (around 02/03/2023) for (12mn), (ECT): (R) L-FCT RFA #1, (PPE).        Interventional Therapies  Risk Factors  Considerations:   Allergy  Intolerance: Hydrocodone  Decreased eGFR  Stage III CKD Elevated LFT  GERD Mitral Valve Prolapse  PVCs     WNL   Planned  Pending:   Therapeutic bilateral lumbar facet RFA #1 (starting with the left side and following with the right 2-6 weeks later)   Under consideration:   Therapeutic bilateral lumbar facet RFA #1  Diagnostic midline caudal ESI +  epidurogram #1  Diagnostic bilateral sacroiliac joint Blk #1  Diagnostic bilateral IA hip inj. #1  Diagnostic left cervical ESI #1  Diagnostic bilateral cervical facet MBB #1    Completed:   Diagnostic bilateral lumbar facet MBB x2 (06/19/2022) (100/100/100 x 3 days/0) (LBP & LEP improved)    Completed by other providers:   Right sacroiliac joint fusion (canceled on 05/28/2016)  Diagnostic/therapeutic bilateral L4-5 IA facet inj. (12/12/2019) (by GAshley Jacobs MD) ([facility])  Diagnostic/therapeutic left L4-5 and L5-S1 TFESI (05/22/2021) (by GAshley Jacobs MD) ([facility])    Therapeutic  Palliative (PRN) options:   None established      Recent Visits No visits were found meeting these conditions. Showing recent visits within past 90 days and meeting all other requirements Today's Visits Date Type Provider Dept  01/20/23 Procedure visit NMilinda Pointer MD [facility]  Showing today's visits and meeting all other requirements Future Appointments No visits were found meeting these conditions. Showing future appointments within next 90 days and meeting all other requirements  Disposition: Discharge home  Discharge (Date  Time): 01/20/2023; 1010 hrs.   Primary Care Physician: GHortencia Pilar MD [facility] Note by: FGaspar Cola MD Date: 01/20/2023; Time: 11:15 AM  Disclaimer:  Medicine is not an eChief Strategy Officer The only guarantee in medicine is that nothing is guaranteed. It is important to note that the decision to proceed with this intervention was based on the information collected from the patient. The Data and conclusions were drawn from the patient's questionnaire, the interview, and the physical examination. Because the information was provided in large part by the patient, it cannot be guaranteed that it has not been purposely or unconsciously manipulated. Every effort has been made to obtain as much  relevant data as possible for this evaluation. It is important to note that the conclusions that lead to this procedure are derived in large part from the available data. Always take into account that the treatment will also be dependent on availability of resources and existing treatment guidelines, considered by other Pain Management Practitioners as being common knowledge and practice, at the time of the intervention. For Medico-Legal purposes, it is also important to point out that variation in procedural techniques and pharmacological choices are the acceptable norm. The indications, contraindications, technique, and results of the above procedure should only be interpreted and judged by a Board-Certified Interventional Pain Specialist with extensive familiarity and expertise in the same exact procedure and technique.

## 2023-01-20 ENCOUNTER — Ambulatory Visit: Payer: BC Managed Care – PPO | Attending: Pain Medicine | Admitting: Pain Medicine

## 2023-01-20 ENCOUNTER — Ambulatory Visit
Admission: RE | Admit: 2023-01-20 | Discharge: 2023-01-20 | Disposition: A | Discharge status: Home / Self Care | Payer: BC Managed Care – PPO | Source: Ambulatory Visit | Attending: Pain Medicine | Admitting: Pain Medicine

## 2023-01-20 ENCOUNTER — Encounter: Payer: Self-pay | Admitting: Pain Medicine

## 2023-01-20 VITALS — BP 119/71 | HR 71 | Temp 97.1°F | Resp 12 | Ht 66.0 in | Wt 200.0 lb

## 2023-01-20 DIAGNOSIS — G8929 Other chronic pain: Secondary | ICD-10-CM

## 2023-01-20 DIAGNOSIS — M47817 Spondylosis without myelopathy or radiculopathy, lumbosacral region: Secondary | ICD-10-CM | POA: Insufficient documentation

## 2023-01-20 DIAGNOSIS — G8918 Other acute postprocedural pain: Secondary | ICD-10-CM | POA: Insufficient documentation

## 2023-01-20 DIAGNOSIS — R937 Abnormal findings on diagnostic imaging of other parts of musculoskeletal system: Secondary | ICD-10-CM | POA: Diagnosis present

## 2023-01-20 DIAGNOSIS — M545 Low back pain, unspecified: Secondary | ICD-10-CM | POA: Insufficient documentation

## 2023-01-20 DIAGNOSIS — M47816 Spondylosis without myelopathy or radiculopathy, lumbar region: Secondary | ICD-10-CM | POA: Diagnosis not present

## 2023-01-20 DIAGNOSIS — M961 Postlaminectomy syndrome, not elsewhere classified: Secondary | ICD-10-CM | POA: Diagnosis present

## 2023-01-20 DIAGNOSIS — M5137 Other intervertebral disc degeneration, lumbosacral region: Secondary | ICD-10-CM

## 2023-01-20 MED ORDER — FENTANYL CITRATE (PF) 100 MCG/2ML IJ SOLN
INTRAMUSCULAR | Status: AC
  Filled 2023-01-20: qty 2

## 2023-01-20 MED ORDER — ROPIVACAINE HCL 2 MG/ML IJ SOLN
INTRAMUSCULAR | Status: AC
  Filled 2023-01-20: qty 20

## 2023-01-20 MED ORDER — TRIAMCINOLONE ACETONIDE 40 MG/ML IJ SUSP
40.0000 mg | Freq: Once | INTRAMUSCULAR | Status: AC
  Administered 2023-01-20: 40 mg

## 2023-01-20 MED ORDER — TRIAMCINOLONE ACETONIDE 40 MG/ML IJ SUSP
INTRAMUSCULAR | Status: AC
  Filled 2023-01-20: qty 1

## 2023-01-20 MED ORDER — OXYCODONE-ACETAMINOPHEN 5-325 MG PO TABS: 1.0000 | ORAL_TABLET | Freq: Three times a day (TID) | ORAL | 0 refills | Status: AC | PRN

## 2023-01-20 MED ORDER — LIDOCAINE HCL 2 % IJ SOLN
20.0000 mL | Freq: Once | INTRAMUSCULAR | Status: AC
  Administered 2023-01-20: 400 mg

## 2023-01-20 MED ORDER — LIDOCAINE HCL (PF) 2 % IJ SOLN
INTRAMUSCULAR | Status: AC
  Filled 2023-01-20: qty 15

## 2023-01-20 MED ORDER — MIDAZOLAM HCL 5 MG/5ML IJ SOLN
INTRAMUSCULAR | Status: AC
  Filled 2023-01-20: qty 5

## 2023-01-20 MED ORDER — ROPIVACAINE HCL 2 MG/ML IJ SOLN
9.0000 mL | Freq: Once | INTRAMUSCULAR | Status: AC
  Administered 2023-01-20: 9 mL via PERINEURAL

## 2023-01-20 MED ORDER — LACTATED RINGERS IV SOLN: Freq: Once | INTRAVENOUS | Status: AC

## 2023-01-20 MED ORDER — MIDAZOLAM HCL 5 MG/5ML IJ SOLN
0.5000 mg | Freq: Once | INTRAMUSCULAR | Status: AC
  Administered 2023-01-20: 3 mg via INTRAVENOUS

## 2023-01-20 MED ORDER — PENTAFLUOROPROP-TETRAFLUOROETH EX AERO
INHALATION_SPRAY | Freq: Once | CUTANEOUS | Status: AC
  Administered 2023-01-20: 30 via TOPICAL

## 2023-01-20 MED ORDER — FENTANYL CITRATE (PF) 100 MCG/2ML IJ SOLN
25.0000 ug | INTRAMUSCULAR | Status: DC | PRN
  Administered 2023-01-20: 50 ug via INTRAVENOUS

## 2023-01-20 NOTE — Progress Notes (Signed)
Safety precautions to be maintained throughout the outpatient stay will include: orient to surroundings, keep bed in low position, maintain call bell within reach at all times, provide assistance with transfer out of bed and ambulation.  

## 2023-01-20 NOTE — Patient Instructions (Signed)
___________________________________________________________________________________________  Post-Radiofrequency (RF) Discharge Instructions  You have just completed a Radiofrequency Neurotomy.  The following instructions will provide you with information and guidelines for self-care upon discharge.  If at any time you have questions or concerns please call your physician. DO NOT DRIVE YOURSELF!!  Instructions:  Apply ice: Fill a plastic sandwich bag with crushed ice. Cover it with a small towel and apply to injection site. Apply for 15 minutes then remove x 15 minutes. Repeat sequence on day of procedure, until you go to bed. The purpose is to minimize swelling and discomfort after procedure.  Apply heat: Apply heat to procedure site starting the day following the procedure. The purpose is to treat any soreness and discomfort from the procedure.  Food intake: No eating limitations, unless stipulated above.  Nevertheless, if you have had sedation, you may experience some nausea.  In this case, it may be wise to wait at least two hours prior to resuming regular diet.  Physical activities: Keep activities to a minimum for the first 8 hours after the procedure. For the first 24 hours after the procedure, do not drive a motor vehicle,  Operate heavy machinery, power tools, or handle any weapons.  Consider walking with the use of an assistive device or accompanied by an adult for the first 24 hours.  Do not drink alcoholic beverages including beer.  Do not make any important decisions or sign any legal documents. Go home and rest today.  Resume activities tomorrow, as tolerated.  Use caution in moving about as you may experience mild leg weakness.  Use caution in cooking, use of household electrical appliances and climbing steps.  Driving: If you have received any sedation, you are not allowed to drive for 24 hours after your procedure.  Blood thinner: Restart your blood thinner 6 hours after your  procedure. (Only for those taking blood thinners)  Insulin: As soon as you can eat, you may resume your normal dosing schedule. (Only for those taking insulin)  Medications: May resume pre-procedure medications.  Do not take any drugs, other than what has been prescribed to you.  Infection prevention: Keep procedure site clean and dry.  Post-procedure Pain Diary: Extremely important that this be done correctly and accurately. Recorded information will be used to determine the next step in treatment.  Pain evaluated is that of treated area only. Do not include pain from an untreated area.  Complete every hour, on the hour, for the initial 8 hours. Set an alarm to help you do this part accurately.  Do not go to sleep and have it completed later. It will not be accurate.  Follow-up appointment: Keep your follow-up appointment after the procedure. Usually 2-6 weeks after radiofrequency. Bring you pain diary. The information collected will be essential for your long-term care.   Expect:  From numbing medicine (AKA: Local Anesthetics): Numbness or decrease in pain.  Onset: Full effect within 15 minutes of injected.  Duration: It will depend on the type of local anesthetic used. On the average, 1 to 8 hours.   From steroids (when added): Decrease in swelling or inflammation. Once inflammation is improved, relief of the pain will follow.  Onset of benefits: Depends on the amount of swelling present. The more swelling, the longer it will take for the benefits to be seen. In some cases, up to 10 days.  Duration: Steroids will stay in the system x 2 weeks. Duration of benefits will depend on multiple posibilities including persistent irritating factors.    this may last as long as 6 weeks. Additional post-procedure pain medication is provided for this. Discomfort is minimized if ice and heat are applied as  instructed.  Call if: You experience numbness and weakness that gets worse with time, as opposed to wearing off. He experience any unusual bleeding, difficulty breathing, or loss of the ability to control your bowel and bladder. (This applies to Spinal procedures only) You experience any redness, swelling, heat, red streaks, elevated temperature, fever, or any other signs of a possible infection.  Emergency Numbers: Durning business hours (Monday - Thursday, 8:00 AM - 4:00 PM) (Friday, 9:00 AM - 12:00 Noon): [phone] After hours: [phone] ____________________________________________________________________________________________    ______________________________________________________________________  Procedure instructions  Do not eat or drink fluids (other than water) for 6 hours before your procedure  No water for 2 hours before your procedure  Take your blood pressure medicine with a sip of water  Arrive 30 minutes before your appointment  Carefully read the "Preparing for your procedure" detailed instructions  If you have questions call us at [phone]  _____________________________________________________________________    ______________________________________________________________________  Preparing for your procedure  During your procedure appointment there will be: No Prescription Refills. No disability issues to discussed. No medication changes or discussions.  Instructions: Food intake: Avoid eating anything solid for at least 8 hours prior to your procedure. Clear liquid intake: You may take clear liquids such as water up to 2 hours prior to your procedure. (No carbonated drinks. No soda.) Transportation: Unless otherwise stated by your physician, bring a driver. Morning Medicines: Except for blood thinners, take all of your other morning medications with a sip of water. Make sure to take your heart and blood pressure medicines. If your  blood pressure's lower number is above 100, the case will be rescheduled. Blood thinners: Make sure to stop your blood thinners as instructed.  If you take a blood thinner, but were not instructed to stop it, call our office [phone] and ask to talk to a nurse. Not stopping a blood thinner prior to certain procedures could lead to serious complications. Diabetics on insulin: Notify the staff so that you can be scheduled 1st case in the morning. If your diabetes requires high dose insulin, take only  of your normal insulin dose the morning of the procedure and notify the staff that you have done so. Preventing infections: Shower with an antibacterial soap the morning of your procedure.  Build-up your immune system: Take 1000 mg of Vitamin C with every meal (3 times a day) the day prior to your procedure. Antibiotics: Inform the nursing staff if you are taking any antibiotics or if you have any conditions that may require antibiotics prior to procedures. (Example: recent joint implants)   Pregnancy: If you are pregnant make sure to notify the nursing staff. Not doing so may result in injury to the fetus, including death.  Sickness: If you have a cold, fever, or any active infections, call and cancel or reschedule your procedure. Receiving steroids while having an infection may result in complications. Arrival: You must be in the facility at least 30 minutes prior to your scheduled procedure. Tardiness: Your scheduled time is also the cutoff time. If you do not arrive at least 15 minutes prior to your procedure, you will be rescheduled.  Children: Do not bring any children with you. Make arrangements to keep them home. Dress appropriately: There is always a possibility that your clothing may get soiled. Avoid long   dresses. Valuables: Do not bring any jewelry or valuables.  Reasons to call and reschedule or cancel your procedure: (Following these recommendations will minimize the risk of a serious  complication.) Surgeries: Avoid having procedures within 2 weeks of any surgery. (Avoid for 2 weeks before or after any surgery). Flu Shots: Avoid having procedures within 2 weeks of a flu shots or . (Avoid for 2 weeks before or after immunizations). Barium: Avoid having a procedure within 7-10 days after having had a radiological study involving the use of radiological contrast. (Myelograms, Barium swallow or enema study). Heart attacks: Avoid any elective procedures or surgeries for the initial 6 months after a "Myocardial Infarction" (Heart Attack). Blood thinners: It is imperative that you stop these medications before procedures. Let us know if you if you take any blood thinner.  Infection: Avoid procedures during or within two weeks of an infection (including chest colds or gastrointestinal problems). Symptoms associated with infections include: Localized redness, fever, chills, night sweats or profuse sweating, burning sensation when voiding, cough, congestion, stuffiness, runny nose, sore throat, diarrhea, nausea, vomiting, cold or Flu symptoms, recent or current infections. It is specially important if the infection is over the area that we intend to treat. Heart and lung problems: Symptoms that may suggest an active cardiopulmonary problem include: cough, chest pain, breathing difficulties or shortness of breath, dizziness, ankle swelling, uncontrolled high or unusually low blood pressure, and/or palpitations. If you are experiencing any of these symptoms, cancel your procedure and contact your primary care physician for an evaluation.  Remember:  Regular Business hours are:  Monday to Thursday 8:00 AM to 4:00 PM  Provider's Schedule: Waylon Koffler, MD:  Procedure days: Tuesday and Thursday 7:30 AM to 4:00 PM  Bilal Lateef, MD:  Procedure days: Monday and Wednesday 7:30 AM to 4:00 PM  ______________________________________________________________________     ____________________________________________________________________________________________  General Risks and Possible Complications  Patient Responsibilities: It is important that you read this as it is part of your informed consent. It is our duty to inform you of the risks and possible complications associated with treatments offered to you. It is your responsibility as a patient to read this and to ask questions about anything that is not clear or that you believe was not covered in this document.  Patient's Rights: You have the right to refuse treatment. You also have the right to change your mind, even after initially having agreed to have the treatment done. However, under this last option, if you wait until the last second to change your mind, you may be charged for the materials used up to that point.  Introduction: Medicine is not an exact science. Everything in Medicine, including the lack of treatment(s), carries the potential for danger, harm, or loss (which is by definition: Risk). In Medicine, a complication is a secondary problem, condition, or disease that can aggravate an already existing one. All treatments carry the risk of possible complications. The fact that a side effects or complications occurs, does not imply that the treatment was conducted incorrectly. It must be clearly understood that these can happen even when everything is done following the highest safety standards.  No treatment: You can choose not to proceed with the proposed treatment alternative. The "PRO(s)" would include: avoiding the risk of complications associated with the therapy. The "CON(s)" would include: not getting any of the treatment benefits. These benefits fall under one of three categories: diagnostic; therapeutic; and/or palliative. Diagnostic benefits include: getting information which can ultimately lead   to improvement of the disease or symptom(s). Therapeutic benefits are those associated with  the successful treatment of the disease. Finally, palliative benefits are those related to the decrease of the primary symptoms, without necessarily curing the condition (example: decreasing the pain from a flare-up of a chronic condition, such as incurable terminal cancer).  General Risks and Complications: These are associated to most interventional treatments. They can occur alone, or in combination. They fall under one of the following six (6) categories: no benefit or worsening of symptoms; bleeding; infection; nerve damage; allergic reactions; and/or death. No benefits or worsening of symptoms: In Medicine there are no guarantees, only probabilities. No healthcare provider can ever guarantee that a medical treatment will work, they can only state the probability that it may. Furthermore, there is always the possibility that the condition may worsen, either directly, or indirectly, as a consequence of the treatment. Bleeding: This is more common if the patient is taking a blood thinner, either prescription or over the counter (example: Goody Powders, Fish oil, Aspirin, Garlic, etc.), or if suffering a condition associated with impaired coagulation (example: Hemophilia, cirrhosis of the liver, low platelet counts, etc.). However, even if you do not have one on these, it can still happen. If you have any of these conditions, or take one of these drugs, make sure to notify your treating physician. Infection: This is more common in patients with a compromised immune system, either due to disease (example: diabetes, cancer, human immunodeficiency virus [HIV], etc.), or due to medications or treatments (example: therapies used to treat cancer and rheumatological diseases). However, even if you do not have one on these, it can still happen. If you have any of these conditions, or take one of these drugs, make sure to notify your treating physician. Nerve Damage: This is more common when the treatment is an  invasive one, but it can also happen with the use of medications, such as those used in the treatment of cancer. The damage can occur to small secondary nerves, or to large primary ones, such as those in the spinal cord and brain. This damage may be temporary or permanent and it may lead to impairments that can range from temporary numbness to permanent paralysis and/or brain death. Allergic Reactions: Any time a substance or material comes in contact with our body, there is the possibility of an allergic reaction. These can range from a mild skin rash (contact dermatitis) to a severe systemic reaction (anaphylactic reaction), which can result in death. Death: In general, any medical intervention can result in death, most of the time due to an unforeseen complication. ____________________________________________________________________________________________    

## 2023-01-21 ENCOUNTER — Telehealth: Payer: Self-pay

## 2023-01-21 NOTE — Telephone Encounter (Signed)
Post procedure follow up.  Patient states she is doing good.  

## 2023-02-26 ENCOUNTER — Ambulatory Visit
Admission: RE | Admit: 2023-02-26 | Discharge: 2023-02-26 | Disposition: A | Discharge status: Home / Self Care | Payer: BC Managed Care – PPO | Source: Ambulatory Visit | Attending: Pain Medicine | Admitting: Pain Medicine

## 2023-02-26 ENCOUNTER — Ambulatory Visit: Payer: BC Managed Care – PPO | Attending: Pain Medicine | Admitting: Pain Medicine

## 2023-02-26 ENCOUNTER — Encounter: Payer: Self-pay | Admitting: Pain Medicine

## 2023-02-26 VITALS — BP 128/78 | HR 63 | Temp 97.2°F | Resp 17 | Ht 66.0 in | Wt 202.0 lb

## 2023-02-26 DIAGNOSIS — M961 Postlaminectomy syndrome, not elsewhere classified: Secondary | ICD-10-CM | POA: Insufficient documentation

## 2023-02-26 DIAGNOSIS — M47816 Spondylosis without myelopathy or radiculopathy, lumbar region: Secondary | ICD-10-CM | POA: Insufficient documentation

## 2023-02-26 DIAGNOSIS — G8918 Other acute postprocedural pain: Secondary | ICD-10-CM | POA: Diagnosis present

## 2023-02-26 DIAGNOSIS — M545 Low back pain, unspecified: Secondary | ICD-10-CM | POA: Insufficient documentation

## 2023-02-26 DIAGNOSIS — G8929 Other chronic pain: Secondary | ICD-10-CM | POA: Diagnosis present

## 2023-02-26 DIAGNOSIS — Z5189 Encounter for other specified aftercare: Secondary | ICD-10-CM | POA: Diagnosis present

## 2023-02-26 DIAGNOSIS — M5137 Other intervertebral disc degeneration, lumbosacral region: Secondary | ICD-10-CM | POA: Insufficient documentation

## 2023-02-26 DIAGNOSIS — R937 Abnormal findings on diagnostic imaging of other parts of musculoskeletal system: Secondary | ICD-10-CM | POA: Insufficient documentation

## 2023-02-26 DIAGNOSIS — R110 Nausea: Secondary | ICD-10-CM | POA: Insufficient documentation

## 2023-02-26 DIAGNOSIS — M47817 Spondylosis without myelopathy or radiculopathy, lumbosacral region: Secondary | ICD-10-CM | POA: Insufficient documentation

## 2023-02-26 MED ORDER — ROPIVACAINE HCL 2 MG/ML IJ SOLN
INTRAMUSCULAR | Status: AC
  Filled 2023-02-26: qty 20

## 2023-02-26 MED ORDER — LACTATED RINGERS IV SOLN: Freq: Once | INTRAVENOUS | Status: DC

## 2023-02-26 MED ORDER — OXYCODONE-ACETAMINOPHEN 5-325 MG PO TABS: 1.0000 | ORAL_TABLET | Freq: Three times a day (TID) | ORAL | 0 refills | Status: AC | PRN

## 2023-02-26 MED ORDER — LIDOCAINE HCL 2 % IJ SOLN
INTRAMUSCULAR | Status: AC
  Filled 2023-02-26: qty 20

## 2023-02-26 MED ORDER — MIDAZOLAM HCL 5 MG/5ML IJ SOLN
INTRAMUSCULAR | Status: AC
  Filled 2023-02-26: qty 5

## 2023-02-26 MED ORDER — ROPIVACAINE HCL 2 MG/ML IJ SOLN
9.0000 mL | Freq: Once | INTRAMUSCULAR | Status: AC
  Administered 2023-02-26: 9 mL via PERINEURAL

## 2023-02-26 MED ORDER — ONDANSETRON HCL 4 MG/2ML IJ SOLN
INTRAMUSCULAR | Status: AC
  Filled 2023-02-26: qty 2

## 2023-02-26 MED ORDER — TRIAMCINOLONE ACETONIDE 40 MG/ML IJ SUSP
40.0000 mg | Freq: Once | INTRAMUSCULAR | Status: AC
  Administered 2023-02-26: 40 mg

## 2023-02-26 MED ORDER — FENTANYL CITRATE (PF) 100 MCG/2ML IJ SOLN
25.0000 ug | INTRAMUSCULAR | Status: DC | PRN
  Administered 2023-02-26: 100 ug via INTRAVENOUS

## 2023-02-26 MED ORDER — PENTAFLUOROPROP-TETRAFLUOROETH EX AERO: INHALATION_SPRAY | Freq: Once | CUTANEOUS | Status: DC

## 2023-02-26 MED ORDER — FENTANYL CITRATE (PF) 100 MCG/2ML IJ SOLN
INTRAMUSCULAR | Status: AC
  Filled 2023-02-26: qty 2

## 2023-02-26 MED ORDER — TRIAMCINOLONE ACETONIDE 40 MG/ML IJ SUSP
INTRAMUSCULAR | Status: AC
  Filled 2023-02-26: qty 1

## 2023-02-26 MED ORDER — ONDANSETRON HCL 4 MG/2ML IJ SOLN
4.0000 mg | Freq: Once | INTRAMUSCULAR | Status: AC
  Administered 2023-02-26: 4 mg via INTRAVENOUS

## 2023-02-26 MED ORDER — LIDOCAINE HCL 2 % IJ SOLN
20.0000 mL | Freq: Once | INTRAMUSCULAR | Status: AC
  Administered 2023-02-26: 400 mg

## 2023-02-26 MED ORDER — MIDAZOLAM HCL 5 MG/5ML IJ SOLN
0.5000 mg | Freq: Once | INTRAMUSCULAR | Status: AC
  Administered 2023-02-26: 3 mg via INTRAVENOUS

## 2023-02-26 NOTE — Progress Notes (Signed)
PROVIDER NOTE: Interpretation of information contained herein should be left to medically-trained personnel. Specific patient instructions are provided elsewhere under "Patient Instructions" section of medical record. This document was created in part using STT-dictation technology, any transcriptional errors that may result from this process are unintentional.  Patient: Donna Cannon Type: Established DOB: [birth year] MRN: [id number] PCP: Hortencia Pilar, MD  Service: Procedure DOS: 02/26/2023 Setting: Ambulatory Location: Ambulatory outpatient facility Delivery: Face-to-face Provider: Gaspar Cola, MD Specialty: Interventional Pain Management Specialty designation: 09 Location: Outpatient facility Ref. Prov.: Milinda Pointer, MD       Interventional Therapy   Procedure: Lumbar Facet, Medial Branch Radiofrequency Ablation (RFA) #1  Laterality: Right (-RT)  Level: L2, L3, L4, L5, and S1 Medial Branch Level(s). These levels will denervate the L3-4, L4-5, and L5-S1 lumbar facet joints.  Imaging: Fluoroscopy-guided         Anesthesia: Local anesthesia (1-2% Lidocaine) Anxiolysis: IV Versed 3.0 mg Sedation: Moderate Sedation Fentanyl 2.0 mL (100 mcg) DOS: 02/26/2023  Performed by: Gaspar Cola, MD  Purpose: Therapeutic/Palliative Indications: Low back pain severe enough to impact quality of life or function. Indications: 1. Chronic low back pain (1ry area of Pain) (Bilateral) (L>R) w/o sciatica   2. Lumbar facet syndrome   3. Lumbosacral facet arthropathy (Multilevel) (Bilateral)   4. Spondylosis of lumbar region without myelopathy or radiculopathy   5. 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 Abnormal MRI, lumbar spine (10/16/2019)    Ms. Garl has been dealing with the above chronic pain for longer than three months and has either failed to respond, was unable to tolerate, or simply did not get enough  benefit from other more conservative therapies including, but not limited to: 1. Over-the-counter medications 2. Anti-inflammatory medications 3. Muscle relaxants 4. Membrane stabilizers 5. Opioids 6. Physical therapy and/or chiropractic manipulation 7. Modalities (Heat, ice, etc.) 8. Invasive techniques such as nerve blocks. Ms. Mcpheron has attained more than 50% relief of the pain from a series of diagnostic injections conducted in separate occasions.  Pain Score: Pre-procedure: 6 /10 Post-procedure: 0-No pain/10     Position / Prep / Materials:  Position: Prone  Prep solution: DuraPrep (Iodine Povacrylex [0.7% available iodine] and Isopropyl Alcohol, 74% w/w) Prep Area: Entire Lumbosacral Region (Lower back from mid-thoracic region to end of tailbone and from flank to flank.) Materials:  Tray: RFA (Radiofrequency) tray Needle(s):  Type: RFA (Teflon-coated radiofrequency ablation needles) Gauge (G): 22  Length: Regular (10cm) Qty: 5      Post-procedure evaluation   Type: Lumbar Facet, Medial Branch Radiofrequency Ablation (RFA) #1  Laterality: Left (-LT)  Level: L2, L3, L4, L5, and S1 Medial Branch Level(s). These levels will denervate the L3-4, L4-5, and L5-S1 lumbar facet joints.  Imaging: Fluoroscopy-guided         Anesthesia: Local anesthesia (1-2% Lidocaine) Anxiolysis: IV Versed 3.0 mg Sedation: Moderate Sedation Fentanyl 1 mL (50 mcg) DOS: 01/20/2023  Performed by: Gaspar Cola, MD  Purpose: Therapeutic/Palliative Indications: Low back pain severe enough to impact quality of life or function.  Pain Score: Pre-procedure: 6 /10 Post-procedure: 0-No pain/10     Effectiveness:  Initial hour after procedure: 100 %. Subsequent 4-6 hours post-procedure: 100 %. Analgesia past initial 6 hours: 100 % (left hip pain and siji has increased). Ongoing improvement:  Analgesic: Pain seems to have improved on the left lower back but she still having pain in the area  of the  left hip. Function: Ms. Lango reports improvement in function ROM: Ms. Gevorkyan reports improvement in ROM  Pre-op H&P Assessment:  Ms. Figaro is a 57 y.o. (year old), female patient, seen today for interventional treatment. She  has a past surgical history that includes Cesarean section; Tubal ligation; Endometrial ablation; Back surgery; Colonoscopy w/ biopsies and polypectomy; and Esophagogastroduodenoscopy (egd) with propofol (N/A, 12/12/2016). Ms. Delion has a current medication list which includes the following prescription(s): allopurinol, calcium carbonate, cholecalciferol, losartan, metamucil fiber, metoprolol succinate, oxycodone-acetaminophen, [START ON 03/05/2023] oxycodone-acetaminophen, pantoprazole, senna-docusate, and levothyroxine, and the following Facility-Administered Medications: fentanyl, lactated ringers, and pentafluoroprop-tetrafluoroeth. Her primarily concern today is the Back Pain  Initial Vital Signs:  Pulse/HCG Rate: 63ECG Heart Rate: 63 (NSR) Temp: (!) 97.2 F (36.2 C) Resp: 18 BP: 116/73 SpO2: 100 %  BMI: Estimated body mass index is 32.6 kg/m as calculated from the following:   Height as of this encounter: '5\' 6"'$  (1.676 m).   Weight as of this encounter: 202 lb (91.6 kg).  Risk Assessment: Allergies: Reviewed. She is allergic to hydrocodone.  Allergy Precautions: None required Coagulopathies: Reviewed. None identified.  Blood-thinner therapy: None at this time Active Infection(s): Reviewed. None identified. Ms. Delaune is afebrile  Site Confirmation: Ms. Dysinger was asked to confirm the procedure and laterality before marking the site Procedure checklist: Completed Consent: Before the procedure and under the influence of no sedative(s), amnesic(s), or anxiolytics, the patient was informed of the treatment options, risks and possible complications. To fulfill our ethical and legal obligations, as recommended by the American Medical Association's Code of Ethics,  I have informed the patient of my clinical impression; the nature and purpose of the treatment or procedure; the risks, benefits, and possible complications of the intervention; the alternatives, including doing nothing; the risk(s) and benefit(s) of the alternative treatment(s) or procedure(s); and the risk(s) and benefit(s) of doing nothing. The patient was provided information about the general risks and possible complications associated with the procedure. These may include, but are not limited to: failure to achieve desired goals, infection, bleeding, organ or nerve damage, allergic reactions, paralysis, and death. In addition, the patient was informed of those risks and complications associated to Spine-related procedures, such as failure to decrease pain; infection (i.e.: Meningitis, epidural or intraspinal abscess); bleeding (i.e.: epidural hematoma, subarachnoid hemorrhage, or any other type of intraspinal or peri-dural bleeding); organ or nerve damage (i.e.: Any type of peripheral nerve, nerve root, or spinal cord injury) with subsequent damage to sensory, motor, and/or autonomic systems, resulting in permanent pain, numbness, and/or weakness of one or several areas of the body; allergic reactions; (i.e.: anaphylactic reaction); and/or death. Furthermore, the patient was informed of those risks and complications associated with the medications. These include, but are not limited to: allergic reactions (i.e.: anaphylactic or anaphylactoid reaction(s)); adrenal axis suppression; blood sugar elevation that in diabetics may result in ketoacidosis or comma; water retention that in patients with history of congestive heart failure may result in shortness of breath, pulmonary edema, and decompensation with resultant heart failure; weight gain; swelling or edema; medication-induced neural toxicity; particulate matter embolism and blood vessel occlusion with resultant organ, and/or nervous system infarction;  and/or aseptic necrosis of one or more joints. Finally, the patient was informed that Medicine is not an exact science; therefore, there is also the possibility of unforeseen or unpredictable risks and/or possible complications that may result in a catastrophic outcome. The patient indicated having understood very clearly. We have given the patient no guarantees and  we have made no promises. Enough time was given to the patient to ask questions, all of which were answered to the patient's satisfaction. Ms. Pondexter has indicated that she wanted to continue with the procedure. Attestation: I, the ordering provider, attest that I have discussed with the patient the benefits, risks, side-effects, alternatives, likelihood of achieving goals, and potential problems during recovery for the procedure that I have provided informed consent. Date  Time: 02/26/2023  8:10 AM   Pre-Procedure Preparation:  Monitoring: As per clinic protocol. Respiration, ETCO2, SpO2, BP, heart rate and rhythm monitor placed and checked for adequate function Safety Precautions: Patient was assessed for positional comfort and pressure points before starting the procedure. Time-out: I initiated and conducted the "Time-out" before starting the procedure, as per protocol. The patient was asked to participate by confirming the accuracy of the "Time Out" information. Verification of the correct person, site, and procedure were performed and confirmed by me, the nursing staff, and the patient. "Time-out" conducted as per Joint Commission's Universal Protocol (UP.01.01.01). Time: 0845  Description of Procedure:          Laterality: See above. Levels:  See above. Safety Precautions: Aspiration looking for blood return was conducted prior to all injections. At no point did we inject any substances, as a needle was being advanced. Before injecting, the patient was told to immediately notify me if she was experiencing any new onset of "ringing in  the ears, or metallic taste in the mouth". No attempts were made at seeking any paresthesias. Safe injection practices and needle disposal techniques used. Medications properly checked for expiration dates. SDV (single dose vial) medications used. After the completion of the procedure, all disposable equipment used was discarded in the proper designated medical waste containers. Local Anesthesia: Protocol guidelines were followed. The patient was positioned over the fluoroscopy table. The area was prepped in the usual manner. The time-out was completed. The target area was identified using fluoroscopy. A 12-in long, straight, sterile hemostat was used with fluoroscopic guidance to locate the targets for each level blocked. Once located, the skin was marked with an approved surgical skin marker. Once all sites were marked, the skin (epidermis, dermis, and hypodermis), as well as deeper tissues (fat, connective tissue and muscle) were infiltrated with a small amount of a short-acting local anesthetic, loaded on a 10cc syringe with a 25G, 1.5-in  Needle. An appropriate amount of time was allowed for local anesthetics to take effect before proceeding to the next step. Technical description of process:  Radiofrequency Ablation (RFA) L2 Medial Branch Nerve RFA: The target area for the L2 medial branch is at the junction of the postero-lateral aspect of the superior articular process and the superior, posterior, and medial edge of the transverse process of L3. Under fluoroscopic guidance, a Radiofrequency needle was inserted until contact was made with os over the superior postero-lateral aspect of the pedicular shadow (target area). Sensory and motor testing was conducted to properly adjust the position of the needle. Once satisfactory placement of the needle was achieved, the numbing solution was slowly injected after negative aspiration for blood. 2.0 mL of the nerve block solution was injected without difficulty or  complication. After waiting for at least 3 minutes, the ablation was performed. Once completed, the needle was removed intact. L3 Medial Branch Nerve RFA: The target area for the L3 medial branch is at the junction of the postero-lateral aspect of the superior articular process and the superior, posterior, and medial edge of the  transverse process of L4. Under fluoroscopic guidance, a Radiofrequency needle was inserted until contact was made with os over the superior postero-lateral aspect of the pedicular shadow (target area). Sensory and motor testing was conducted to properly adjust the position of the needle. Once satisfactory placement of the needle was achieved, the numbing solution was slowly injected after negative aspiration for blood. 2.0 mL of the nerve block solution was injected without difficulty or complication. After waiting for at least 3 minutes, the ablation was performed. Once completed, the needle was removed intact. L4 Medial Branch Nerve RFA: The target area for the L4 medial branch is at the junction of the postero-lateral aspect of the superior articular process and the superior, posterior, and medial edge of the transverse process of L5. Under fluoroscopic guidance, a Radiofrequency needle was inserted until contact was made with os over the superior postero-lateral aspect of the pedicular shadow (target area). Sensory and motor testing was conducted to properly adjust the position of the needle. Once satisfactory placement of the needle was achieved, the numbing solution was slowly injected after negative aspiration for blood. 2.0 mL of the nerve block solution was injected without difficulty or complication. After waiting for at least 3 minutes, the ablation was performed. Once completed, the needle was removed intact. L5 Medial Branch Nerve RFA: The target area for the L5 medial branch is at the junction of the postero-lateral aspect of the superior articular process of S1 and the  superior, posterior, and medial edge of the sacral ala. Under fluoroscopic guidance, a Radiofrequency needle was inserted until contact was made with os over the superior postero-lateral aspect of the pedicular shadow (target area). Sensory and motor testing was conducted to properly adjust the position of the needle. Once satisfactory placement of the needle was achieved, the numbing solution was slowly injected after negative aspiration for blood. 2.0 mL of the nerve block solution was injected without difficulty or complication. After waiting for at least 3 minutes, the ablation was performed. Once completed, the needle was removed intact. S1 Medial Branch Nerve RFA: The target area for the S1 medial branch is located inferior to the junction of the S1 superior articular process and the L5 inferior articular process, posterior, inferior, and lateral to the 6 o'clock position of the L5-S1 facet joint, just superior to the S1 posterior foramen. Under fluoroscopic guidance, the Radiofrequency needle was advanced until contact was made with os over the Target area. Sensory and motor testing was conducted to properly adjust the position of the needle. Once satisfactory placement of the needle was achieved, the numbing solution was slowly injected after negative aspiration for blood. 2.0 mL of the nerve block solution was injected without difficulty or complication. After waiting for at least 3 minutes, the ablation was performed. Once completed, the needle was removed intact. Radiofrequency lesioning (ablation):  Radiofrequency Generator: Medtronic AccurianTM AG 1000 RF Generator Sensory Stimulation Parameters: 50 Hz was used to locate & identify the nerve, making sure that the needle was positioned such that there was no sensory stimulation below 0.3 V or above 0.7 V. Motor Stimulation Parameters: 2 Hz was used to evaluate the motor component. Care was taken not to lesion any nerves that demonstrated motor  stimulation of the lower extremities at an output of less than 2.5 times that of the sensory threshold, or a maximum of 2.0 V. Lesioning Technique Parameters: Standard Radiofrequency settings. (Not bipolar or pulsed.) Temperature Settings: 80 degrees C Lesioning time: 60 seconds Intra-operative Compliance: Compliant  Once the entire procedure was completed, the treated area was cleaned, making sure to leave some of the prepping solution back to take advantage of its long term bactericidal properties.    Illustration of the posterior view of the lumbar spine and the posterior neural structures. Laminae of L2 through S1 are labeled. DPRL5, dorsal primary ramus of L5; DPRS1, dorsal primary ramus of S1; DPR3, dorsal primary ramus of L3; FJ, facet (zygapophyseal) joint L3-L4; I, inferior articular process of L4; LB1, lateral branch of dorsal primary ramus of L1; IAB, inferior articular branches from L3 medial branch (supplies L4-L5 facet joint); IBP, intermediate branch plexus; MB3, medial branch of dorsal primary ramus of L3; NR3, third lumbar nerve root; S, superior articular process of L5; SAB, superior articular branches from L4 (supplies L4-5 facet joint also); TP3, transverse process of L3.  Vitals:   02/26/23 0915 02/26/23 0925 02/26/23 0935 02/26/23 0946  BP: 113/70 126/75 130/77 128/78  Pulse:      Resp: '14 13 11 17  '$ Temp:      SpO2: 100% 100% 94% 98%  Weight:      Height:        Start Time: 0845 hrs. End Time: 0915 hrs.  Imaging Guidance (Spinal):          Type of Imaging Technique: Fluoroscopy Guidance (Spinal) Indication(s): Assistance in needle guidance and placement for procedures requiring needle placement in or near specific anatomical locations not easily accessible without such assistance. Exposure Time: Please see nurses notes. Contrast: None used. Fluoroscopic Guidance: I was personally present during the use of fluoroscopy. "Tunnel Vision Technique" used to obtain the  best possible view of the target area. Parallax error corrected before commencing the procedure. "Direction-depth-direction" technique used to introduce the needle under continuous pulsed fluoroscopy. Once target was reached, antero-posterior, oblique, and lateral fluoroscopic projection used confirm needle placement in all planes. Images permanently stored in EMR. Interpretation: No contrast injected. I personally interpreted the imaging intraoperatively. Adequate needle placement confirmed in multiple planes. Permanent images saved into the patient's record.  Antibiotic Prophylaxis:   Anti-infectives (From admission, onward)    None      Indication(s): None identified  Post-operative Assessment:  Post-procedure Vital Signs:  Pulse/HCG Rate: 63(!) 59 Temp: (!) 97.2 F (36.2 C) Resp: 17 BP: 128/78 SpO2: 98 %  EBL: None  Complications: No immediate post-treatment complications observed by team, or reported by patient.  Note: The patient tolerated the entire procedure well. A repeat set of vitals were taken after the procedure and the patient was kept under observation following institutional policy, for this type of procedure. Post-procedural neurological assessment was performed, showing return to baseline, prior to discharge. The patient was provided with post-procedure discharge instructions, including a section on how to identify potential problems. Should any problems arise concerning this procedure, the patient was given instructions to immediately contact us, at any time, without hesitation. In any case, we plan to contact the patient by telephone for a follow-up status report regarding this interventional procedure.  Comments:  No additional relevant information.  Plan of Care (POC)  Orders:  Orders Placed This Encounter  Procedures   Radiofrequency,Lumbar    Scheduling Instructions:     Side(s): Right-sided     Level: L3-4, L4-5, and L5-S1 Facets (L2, L3, L4, L5, and S1  Medial Branch)     Sedation: With Sedation.     Timeframe: Today    Order Specific Question:   Where will this procedure be performed?  Answer:   [facility]   DG [facility] C-ARM 1-60 MIN NO REPORT    Intraoperative interpretation by procedural physician at [facility].    Standing Status:   Standing    Number of Occurrences:   1    Order Specific Question:   Reason for exam:    Answer:   Assistance in needle guidance and placement for procedures requiring needle placement in or near specific anatomical locations not easily accessible without such assistance.   Informed Consent Details: Physician/Practitioner Attestation; Transcribe to consent form and obtain patient signature    Nursing Order: Transcribe to consent form and obtain patient signature. Note: Always confirm laterality of pain with Ms. Pigue, before procedure.    Order Specific Question:   Physician/Practitioner attestation of informed consent for procedure/surgical case    Answer:   I, the physician/practitioner, attest that I have discussed with the patient the benefits, risks, side effects, alternatives, likelihood of achieving goals and potential problems during recovery for the procedure that I have provided informed consent.    Order Specific Question:   Procedure    Answer:   Lumbar Facet Radiofrequency Ablation    Order Specific Question:   Physician/Practitioner performing the procedure    Answer:   Deajah Erkkila A. Dossie Arbour, MD    Order Specific Question:   Indication/Reason    Answer:   Low Back Pain, with our without leg pain, due to Facet Joint Arthralgia (Joint Pain) known as Lumbar Facet Syndrome, secondary to Lumbar, and/or Lumbosacral Spondylosis (Arthritis of the Spine), without myelopathy or radiculopathy (Nerve Damage).   Provide equipment / supplies at bedside    Procedure tray: "Radiofrequency Tray" Additional material: Large hemostat (x1); Small hemostat (x1); Towels (x8); 4x4 sterile  sponge pack (x1) Needle type: Teflon-coated Radiofrequency Needle (Disposable  single use) Size: Regular Quantity: 5    Standing Status:   Standing    Number of Occurrences:   1    Order Specific Question:   Specify    Answer:   Radiofrequency Tray   Nursing Instructions:    Please complete this patient's postprocedure evaluation.    Scheduling Instructions:     Please complete this patient's postprocedure evaluation.   Follow-up    Schedule Ms. Rudd for a post-procedure follow-up evaluation encounter 6 weeks from now.    Standing Status:   Future    Standing Expiration Date:   04/28/2023    Scheduling Instructions:     Schedule follow-up visit on afternoon of procedure day (T, Th)     Type: Face-to-face (F2F) Post-procedure (PP) evaluation (E/M)     When: 6 weeks from now   Chronic Opioid Analgesic:  None MME/day: 0 mg/day   Medications ordered for procedure: Meds ordered this encounter  Medications   lidocaine (XYLOCAINE) 2 % (with pres) injection 400 mg   pentafluoroprop-tetrafluoroeth (GEBAUERS) aerosol   lactated ringers infusion   midazolam (VERSED) 5 MG/5ML injection 0.5-2 mg    Make sure Flumazenil is available in the pyxis when using this medication. If oversedation occurs, administer 0.2 mg IV over 15 sec. If after 45 sec no response, administer 0.2 mg again over 1 min; may repeat at 1 min intervals; not to exceed 4 doses (1 mg)   fentaNYL (SUBLIMAZE) injection 25-50 mcg    Make sure Narcan is available in the pyxis when using this medication. In the event of respiratory depression (RR< 8/min): Titrate NARCAN (naloxone) in increments of 0.1 to 0.2 mg IV at 2-3  minute intervals, until desired degree of reversal.   triamcinolone acetonide (KENALOG-40) injection 40 mg   ropivacaine (PF) 2 mg/mL (0.2%) (NAROPIN) injection 9 mL   oxyCODONE-acetaminophen (PERCOCET) 5-325 MG tablet    Sig: Take 1 tablet by mouth every 8 (eight) hours as needed for up to 7 days for severe  pain. Must last 7 days.    Dispense:  21 tablet    Refill:  0    For acute post-operative pain. Not to be refilled.  Must last 7 days.   oxyCODONE-acetaminophen (PERCOCET) 5-325 MG tablet    Sig: Take 1 tablet by mouth every 8 (eight) hours as needed for up to 7 days for severe pain. Must last 7 days.    Dispense:  21 tablet    Refill:  0    For acute post-operative pain. Not to be refilled.  Must last 7 days.   ondansetron (ZOFRAN) injection 4 mg   Medications administered: We administered lidocaine, midazolam, fentaNYL, triamcinolone acetonide, ropivacaine (PF) 2 mg/mL (0.2%), and ondansetron.  See the medical record for exact dosing, route, and time of administration.  Follow-up plan:   Return in about 6 weeks (around 04/09/2023) for Proc-day (T,Th), (Face2F), (PPE).       Interventional Therapies  Risk Factors  Considerations:   Allergy  Intolerance: Hydrocodone  Decreased eGFR  Stage III CKD Elevated LFT  GERD Mitral Valve Prolapse  PVCs     WNL   Planned  Pending:   Therapeutic bilateral lumbar facet RFA #1 (starting with the left side and following with the right 2-6 weeks later)   Under consideration:   Therapeutic bilateral lumbar facet RFA #1  Diagnostic midline caudal ESI + epidurogram #1  Diagnostic bilateral sacroiliac joint Blk #1  Diagnostic bilateral IA hip inj. #1  Diagnostic left cervical ESI #1  Diagnostic bilateral cervical facet MBB #1    Completed:   Diagnostic bilateral lumbar facet MBB x2 (06/19/2022) (100/100/100 x 3 days/0) (LBP & LEP improved)    Completed by other providers:   Right sacroiliac joint fusion (canceled on 05/28/2016)  Diagnostic/therapeutic bilateral L4-5 IA facet inj. (12/12/2019) (by Ashley Jacobs, MD) ([facility])  Diagnostic/therapeutic left L4-5 and L5-S1 TFESI (05/22/2021) (by Ashley Jacobs, MD) ([facility])    Therapeutic  Palliative (PRN) options:   None established       Recent Visits Date Type  Provider Dept  01/20/23 Procedure visit Milinda Pointer, MD [facility]  Showing recent visits within past 90 days and meeting all other requirements Today's Visits Date Type Provider Dept  02/26/23 Procedure visit Milinda Pointer, MD [facility]  Showing today's visits and meeting all other requirements Future Appointments Date Type Provider Dept  04/09/23 Appointment Milinda Pointer, MD [facility]  Showing future appointments within next 90 days and meeting all other requirements  Disposition: Discharge home  Discharge (Date  Time): 02/26/2023; 0950 hrs.   Primary Care Physician: Hortencia Pilar, MD Location: [facility] Note by: Gaspar Cola, MD (TTS technology used. I apologize for any typographical errors that were not detected and corrected.) Date: 02/26/2023; Time: 10:45 AM  Disclaimer:  Medicine is not an Chief Strategy Officer. The only guarantee in medicine is that nothing is guaranteed. It is important to note that the decision to proceed with this intervention was based on the information collected from the patient. The Data and conclusions were drawn from the patient's questionnaire, the interview, and the physical examination. Because the  information was provided in large part by the patient, it cannot be guaranteed that it has not been purposely or unconsciously manipulated. Every effort has been made to obtain as much relevant data as possible for this evaluation. It is important to note that the conclusions that lead to this procedure are derived in large part from the available data. Always take into account that the treatment will also be dependent on availability of resources and existing treatment guidelines, considered by other Pain Management Practitioners as being common knowledge and practice, at the time of the intervention. For Medico-Legal purposes, it is also important to point out that variation in  procedural techniques and pharmacological choices are the acceptable norm. The indications, contraindications, technique, and results of the above procedure should only be interpreted and judged by a Board-Certified Interventional Pain Specialist with extensive familiarity and expertise in the same exact procedure and technique.

## 2023-02-26 NOTE — Progress Notes (Signed)
0930 Complaining of nausea. Dr. Dossie Arbour nnotified. 0945 States nausea better.

## 2023-02-26 NOTE — Progress Notes (Signed)
Safety precautions to be maintained throughout the outpatient stay will include: orient to surroundings, keep bed in low position, maintain call bell within reach at all times, provide assistance with transfer out of bed and ambulation.  

## 2023-02-26 NOTE — Patient Instructions (Signed)

## 2023-02-27 ENCOUNTER — Telehealth: Payer: Self-pay | Admitting: *Deleted

## 2023-02-27 NOTE — Telephone Encounter (Signed)
Post procedure call:   no  questions or concerns.  

## 2023-04-09 ENCOUNTER — Other Ambulatory Visit: Payer: Self-pay | Admitting: Pain Medicine

## 2023-04-09 ENCOUNTER — Ambulatory Visit (HOSPITAL_BASED_OUTPATIENT_CLINIC_OR_DEPARTMENT_OTHER): Payer: BC Managed Care – PPO | Admitting: Pain Medicine

## 2023-04-09 ENCOUNTER — Encounter: Payer: Self-pay | Admitting: Pain Medicine

## 2023-04-09 ENCOUNTER — Ambulatory Visit
Admission: RE | Admit: 2023-04-09 | Discharge: 2023-04-09 | Disposition: A | Discharge status: Home / Self Care | Payer: BC Managed Care – PPO | Source: Ambulatory Visit | Attending: Pain Medicine | Admitting: Pain Medicine

## 2023-04-09 VITALS — BP 131/81 | HR 68 | Temp 97.2°F | Resp 18 | Ht 66.0 in | Wt 203.0 lb

## 2023-04-09 DIAGNOSIS — M545 Low back pain, unspecified: Secondary | ICD-10-CM

## 2023-04-09 DIAGNOSIS — M5386 Other specified dorsopathies, lumbar region: Secondary | ICD-10-CM | POA: Insufficient documentation

## 2023-04-09 DIAGNOSIS — G8929 Other chronic pain: Secondary | ICD-10-CM

## 2023-04-09 DIAGNOSIS — M5030 Other cervical disc degeneration, unspecified cervical region: Secondary | ICD-10-CM

## 2023-04-09 DIAGNOSIS — M79604 Pain in right leg: Secondary | ICD-10-CM | POA: Insufficient documentation

## 2023-04-09 DIAGNOSIS — M79605 Pain in left leg: Secondary | ICD-10-CM | POA: Insufficient documentation

## 2023-04-09 DIAGNOSIS — M5382 Other specified dorsopathies, cervical region: Secondary | ICD-10-CM

## 2023-04-09 DIAGNOSIS — M79602 Pain in left arm: Secondary | ICD-10-CM | POA: Insufficient documentation

## 2023-04-09 DIAGNOSIS — M4310 Spondylolisthesis, site unspecified: Secondary | ICD-10-CM

## 2023-04-09 DIAGNOSIS — M25551 Pain in right hip: Secondary | ICD-10-CM | POA: Insufficient documentation

## 2023-04-09 DIAGNOSIS — M47816 Spondylosis without myelopathy or radiculopathy, lumbar region: Secondary | ICD-10-CM | POA: Insufficient documentation

## 2023-04-09 DIAGNOSIS — M25552 Pain in left hip: Secondary | ICD-10-CM

## 2023-04-09 DIAGNOSIS — M533 Sacrococcygeal disorders, not elsewhere classified: Secondary | ICD-10-CM | POA: Insufficient documentation

## 2023-04-09 DIAGNOSIS — M4312 Spondylolisthesis, cervical region: Secondary | ICD-10-CM

## 2023-04-09 DIAGNOSIS — M542 Cervicalgia: Secondary | ICD-10-CM | POA: Insufficient documentation

## 2023-04-09 NOTE — Progress Notes (Signed)
PROVIDER NOTE: Information contained herein reflects review and annotations entered in association with encounter. Interpretation of such information and data should be left to medically-trained personnel. Information provided to patient can be located elsewhere in the medical record under "Patient Instructions". Document created using STT-dictation technology, any transcriptional errors that may result from process are unintentional.    Patient: Donna Cannon  Service Category: E/M  Provider: Oswaldo Done, MD  DOB: 1966-04-05  DOS: 04/09/2023  Referring Provider: Rolm Gala, MD  MRN: [id number]  Specialty: Interventional Pain Management  PCP: Rolm Gala, MD  Type: Established Patient  Setting: Ambulatory outpatient    Location: Office  Delivery: Face-to-face     HPI  [name], a 57 y.o. year old female, is here today because of her Chronic bilateral low back pain without sciatica [M54.50, G89.29]. [name] primary complain today is Back Pain (lower)  Pertinent problems: 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; Radiculopathy of cervical region; Spinal stenosis of cervical region; Osteopenia of multiple sites; Spondylosis of lumbar region without myelopathy or radiculopathy; Thoracic spine pain; Chronic pain syndrome; Abnormal MRI, cervical spine (11/18/2021); Abnormal MRI, lumbar spine (10/16/2019); Lumbosacral facet arthropathy (Multilevel) (Bilateral); 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; Lumbar foraminal stenosis (L4-5) (Bilateral); DDD (degenerative disc disease), lumbosacral; Cervical facet hypertrophy; DDD (degenerative disc disease), cervical; Chronic feet pain (5th area of Pain) (Bilateral); Chronic wrist pain  (7th area of Pain) (Right); Chronic low back pain (1ry area of Pain) (Bilateral) (L>R) w/o sciatica; 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); Chronic lower extremity pain (2ry area of Pain) (Bilateral) (L>R); Chronic lower extremity radicular pain (S1 dermatome) (Bilateral); Chronic upper extremity pain (3ry area of Pain) (Left); Chronic neck pain (4th area of Pain) (Posterior) (Bilateral) (L>R); Chronic ankle pain (6th area of Pain) (Bilateral); Grade 1 Retrolisthesis of cervical spine (C3/C4 and C5/C6); Decreased range of motion of lumbar spine; Painful cervical range of motion; Impaired range of motion of cervical spine; Lumbar facet syndrome; Chronic hip pain (Bilateral); and Chronic sacroiliac joint pain (Bilateral) on their pertinent problem list. Pain Assessment: Severity of Chronic pain is reported as a 5 /10. Location: Back Lower/denies. Onset: More than a month ago. Quality: Dull, Throbbing. Timing: Intermittent. Modifying factor(s): lying down, heating pad. Vitals:  height is 5\' 6"  (1.676 m) and weight is 203 lb (92.1 kg). Her temporal temperature is 97.2 F (36.2 C) (abnormal). Her blood pressure is 131/81 and her pulse is 68. Her respiration is 18 and oxygen saturation is 100%.  BMI: Estimated body mass index is 32.77 kg/m as calculated from the following:   Height as of this encounter: 5\' 6"  (1.676 m).   Weight as of this encounter: 203 lb (92.1 kg). Last encounter: 08/11/2022. Last procedure: 02/26/2023.  Reason for encounter: post-procedure evaluation and assessment.  The patient indicates having attained a 100% relief of the pain for the duration of the local anesthetic followed by an ongoing 75% improvement of the low back pain.  However, the patient comes in today indicating that she still has some pain in the buttocks and lateral aspect of the upper leg over the hip joints.  Physical exam today demonstrated through their provocative Luisa Hart maneuver that she is experiencing  some left sacroiliac joint arthralgia but she is also having bilateral  hip joint arthralgia with bilateral decreased range of motion of the hip joints.  Today I will be ordering diagnostic x-rays of the hips and I will schedule her to come back for a diagnostic bilateral intra-articular hip joint injection with some steroid.  She was extremely happy about the results of the radiofrequency ablation and in fact she had asked if that technique can be used for the hips and the cervical spine since she is also beginning to experience some cramping of the left upper extremity and hand.  I also told her that this could be secondary to the use of the steroids.  I have provided her with some information in the AVS on how to treat that.  Post-procedure evaluation   Procedure: Lumbar Facet, Medial Branch Radiofrequency Ablation (RFA) #1  Laterality: Right (-RT)  Level: L2, L3, L4, L5, and S1 Medial Branch Level(s). These levels will denervate the L3-4, L4-5, and L5-S1 lumbar facet joints.  Imaging: Fluoroscopy-guided         Anesthesia: Local anesthesia (1-2% Lidocaine) Anxiolysis: IV Versed 3.0 mg Sedation: Moderate Sedation Fentanyl 2.0 mL (100 mcg) DOS: 02/26/2023  Performed by: Oswaldo Done, MD  Purpose: Therapeutic/Palliative Indications: Low back pain severe enough to impact quality of life or function. Indications: 1. Chronic low back pain (1ry area of Pain) (Bilateral) (L>R) w/o sciatica   2. Lumbar facet syndrome   3. Lumbosacral facet arthropathy (Multilevel) (Bilateral)   4. Spondylosis of lumbar region without myelopathy or radiculopathy   5. 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 Abnormal MRI, lumbar spine (10/16/2019)    [name] has been dealing with the above chronic pain for longer than three months and has either failed to respond, was unable to tolerate, or simply did not get enough benefit from other more  conservative therapies including, but not limited to: 1. Over-the-counter medications 2. Anti-inflammatory medications 3. Muscle relaxants 4. Membrane stabilizers 5. Opioids 6. Physical therapy and/or chiropractic manipulation 7. Modalities (Heat, ice, etc.) 8. Invasive techniques such as nerve blocks. [name] has attained more than 50% relief of the pain from a series of diagnostic injections conducted in separate occasions.  Pain Score: Pre-procedure: 6 /10 Post-procedure: 0-No pain/10     Effectiveness:  Initial hour after procedure: 100 %. Subsequent 4-6 hours post-procedure: 100 %. Analgesia past initial 6 hours: 75 %. Ongoing improvement:  Analgesic: The patient indicated having attained an ongoing 75% relief of the pain on the right lower back. Function: [name] reports improvement in function ROM: [name] reports improvement in ROM  Pharmacotherapy Assessment  Analgesic: None MME/day: 0 mg/day   Monitoring: [place] PMP: PDMP reviewed during this encounter.       Pharmacotherapy: No side-effects or adverse reactions reported. Compliance: No problems identified. Effectiveness: Clinically acceptable.  No notes on file  No results found for: "CBDTHCR" No results found for: "D8THCCBX" No results found for: "D9THCCBX"  UDS:  Summary  Date Value Ref Range Status  03/17/2022 Note  Final    Comment:    ==================================================================== Compliance Drug Analysis, Ur ==================================================================== Test                             Result       Flag       Units  Drug Present and Declared for Prescription Verification   Cyclobenzaprine  PRESENT      EXPECTED   Desmethylcyclobenzaprine       PRESENT      EXPECTED    Desmethylcyclobenzaprine is an expected metabolite of    cyclobenzaprine.  Drug Present not Declared for Prescription Verification   Diphenhydramine                 PRESENT      UNEXPECTED   Metoprolol                     PRESENT      UNEXPECTED ==================================================================== Test                      Result    Flag   Units      Ref Range   Creatinine              68               mg/dL      >=69 ==================================================================== Declared Medications:  The flagging and interpretation on this report are based on the  following declared medications.  Unexpected results may arise from  inaccuracies in the declared medications.   **Note: The testing scope of this panel includes these medications:   Cyclobenzaprine (Flexeril)   **Note: The testing scope of this panel does not include the  following reported medications:   Allopurinol (Zyloprim)  Calcium  Colchicine  Cyanocobalamin  Levothyroxine (Synthroid)  Losartan (Cozaar)  Pantoprazole (Protonix)  Sennosides (Senokot)  Vitamin D3 ==================================================================== For clinical consultation, please call [phone]. ====================================================================       ROS  Constitutional: Denies any fever or chills Gastrointestinal: No reported hemesis, hematochezia, vomiting, or acute GI distress Musculoskeletal: Denies any acute onset joint swelling, redness, loss of ROM, or weakness Neurological: No reported episodes of acute onset apraxia, aphasia, dysarthria, agnosia, amnesia, paralysis, loss of coordination, or loss of consciousness  Medication Review  Metamucil Fiber, allopurinol, calcium carbonate, cholecalciferol, levothyroxine, losartan, metoprolol succinate, pantoprazole, and senna-docusate  History Review  Allergy: [name] is allergic to hydrocodone. Drug: [name]  reports no history of drug use. Alcohol:  reports current alcohol use. Tobacco:  reports that she has never smoked. She has never used smokeless tobacco. Social: [name]   reports that she has never smoked. She has never used smokeless tobacco. She reports current alcohol use. She reports that she does not use drugs. Medical:  has a past medical history of Anxiety, Arthritis, Cancer, GERD (gastroesophageal reflux disease), Hypertension, Hypothyroidism, Mitral valve prolapse, PONV (postoperative nausea and vomiting), Sacroiliac dysfunction, Spinal headache, and Wears glasses. Surgical: Ms. Lepak  has a past surgical history that includes Cesarean section; Tubal ligation; Endometrial ablation; Back surgery; Colonoscopy w/ biopsies and polypectomy; and Esophagogastroduodenoscopy (egd) with propofol (N/A, 12/12/2016). Family: family history includes Breast cancer in her mother; Colon cancer in her brother; Hypertension in her mother and sister.  Laboratory Chemistry Profile   Renal Lab Results  Component Value Date   BUN 14 03/17/2022   CREATININE 1.10 (H) 03/17/2022   GFRAA >60 05/20/2016   GFRNONAA 59 (L) 03/17/2022    Hepatic Lab Results  Component Value Date   AST 69 (H) 03/17/2022   ALT 104 (H) 03/17/2022   ALBUMIN 4.7 03/17/2022   ALKPHOS 88 03/17/2022    Electrolytes Lab Results  Component Value Date   NA 140 03/17/2022   K 4.3 03/17/2022   CL 105 03/17/2022   CALCIUM 10.0 03/17/2022  MG 2.0 03/17/2022    Bone Lab Results  Component Value Date   25OHVITD1 67 03/17/2022   25OHVITD2 61 03/17/2022   25OHVITD3 5.7 03/17/2022    Inflammation (CRP: Acute Phase) (ESR: Chronic Phase) Lab Results  Component Value Date   CRP 0.8 03/17/2022   ESRSEDRATE 7 03/17/2022         Note: Above Lab results reviewed.  Recent Imaging Review  DG PAIN CLINIC C-ARM 1-60 MIN NO REPORT Fluoro was used, but no Radiologist interpretation will be provided.  Please refer to "NOTES" tab for provider progress note. Note: Reviewed        Physical Exam  General appearance: Well nourished, well developed, and well hydrated. In no apparent acute distress Mental  status: Alert, oriented x 3 (person, place, & time)       Respiratory: No evidence of acute respiratory distress Eyes: PERLA Vitals: BP 131/81   Pulse 68   Temp (!) 97.2 F (36.2 C) (Temporal)   Resp 18   Ht  (1.676 m)   Wt 203 lb (92.1 kg)   SpO2 100%   BMI 32.77 kg/m  BMI: Estimated body mass index is 32.77 kg/m as calculated from the following:   Height as of this encounter:  (1.676 m).   Weight as of this encounter: 203 lb (92.1 kg). Ideal: Ideal body weight: 59.3 kg (130 lb 11.7 oz) Adjusted ideal body weight: 72.4 kg (159 lb 10.2 oz)  Assessment   Diagnosis Status  1. Chronic low back pain (1ry area of Pain) (Bilateral) (L>R) w/o sciatica   2. Lumbar facet syndrome   3. Decreased range of motion of lumbar spine   4. Chronic lower extremity pain (2ry area of Pain) (Bilateral) (L>R)   5. Chronic upper extremity pain (3ry area of Pain) (Left)   6. Chronic neck pain (4th area of Pain) (Posterior) (Bilateral) (L>R)   7. DDD (degenerative disc disease), cervical   8. Grade 1 Retrolisthesis of cervical spine (C3/C4 and C5/C6)   9. Impaired range of motion of cervical spine   10. Chronic hip pain (Bilateral)   11. Chronic sacroiliac joint pain (Bilateral)    Improved Resolved Improved   Updated Problems: Problem  Nausea Without Vomiting (Resolved)    Plan of Care  Problem-specific:  No problem-specific Assessment & Plan notes found for this encounter.  [name] has a current medication list which includes the following long-term medication(s): allopurinol, calcium carbonate, losartan, pantoprazole, and levothyroxine.  Pharmacotherapy (Medications Ordered): No orders of the defined types were placed in this encounter.  Orders:  Orders Placed This Encounter  Procedures   HIP INJECTION    Standing Status:   Future    Standing Expiration Date:   07/09/2023    Scheduling Instructions:     Procedure: Intra-articular Hip joint injection     Side:  Bilateral     Approach: Lateral     Sedation: Patient's choice.     Timeframe: ASAP   DG HIP UNILAT W OR W/O PELVIS 2-3 VIEWS RIGHT    Please describe any evidence of DJD, such as joint narrowing, asymmetry, cysts, or any anomalies in bone density, production, or erosion.    Standing Status:   Future    Standing Expiration Date:   05/09/2023    Scheduling Instructions:     Please make sure that the patient understands that this needs to be done as soon as possible. Never have the patient do the imaging "just  before the next appointment". Inform patient that having the imaging done within the [facility] will expedite the availability of the results and will provide      imaging availability to the requesting physician. In addition inform the patient that the imaging order has an expiration date and will not be renewed if not done within the active period.    Order Specific Question:   Reason for Exam (SYMPTOM  OR DIAGNOSIS REQUIRED)    Answer:   Right hip pain/arthralgia    Order Specific Question:   Is the patient pregnant?    Answer:   No    Order Specific Question:   Preferred imaging location?    Answer:   [facility]    Order Specific Question:   Call Results- Best Contact Number?    Answer:   [phone] [facility])    Order Specific Question:   Release to patient    Answer:   Immediate   DG HIP UNILAT W OR W/O PELVIS 2-3 VIEWS LEFT    Please describe any evidence of DJD, such as joint narrowing, asymmetry, cysts, or any anomalies in bone density, production, or erosion.    Standing Status:   Future    Standing Expiration Date:   05/09/2023    Scheduling Instructions:     Please make sure that the patient understands that this needs to be done as soon as possible. Never have the patient do the imaging "just before the next appointment". Inform patient that having the imaging done within the [facility] will expedite the availability of the results and will provide       imaging availability to the requesting physician. In addition inform the patient that the imaging order has an expiration date and will not be renewed if not done within the active period.    Order Specific Question:   Reason for Exam (SYMPTOM  OR DIAGNOSIS REQUIRED)    Answer:   Right hip pain/arthralgia    Order Specific Question:   Is the patient pregnant?    Answer:   No    Order Specific Question:   Preferred imaging location?    Answer:   [facility]    Order Specific Question:   Call Results- Best Contact Number?    Answer:   [phone] [facility])    Order Specific Question:   Release to patient    Answer:   Immediate   Follow-up plan:   Return for [facility]): (B) IA Hip inj. #1.      Interventional Therapies  Risk Factors  Considerations:   Allergy  Intolerance: Hydrocodone  Decreased eGFR  Stage III CKD Elevated LFT  GERD Mitral Valve Prolapse  PVCs     WNL   Planned  Pending:      Under consideration:   Diagnostic midline caudal ESI + epidurogram #1  Diagnostic bilateral sacroiliac joint Blk #1  Diagnostic bilateral IA hip inj. #1  Diagnostic left cervical ESI #1  Diagnostic bilateral cervical facet MBB #1    Completed:   Therapeutic right (L3-4, L4-5, L5-S1) lumbar facet (L2-S1) MB- RFA x1 (02/26/2023)  Therapeutic left (L3-4, L4-5, L5-S1) lumbar facet (L2-S1) MB- RFA x1 (01/20/2023) (100/100/100/LBP:90Hip:50) Diagnostic bilateral lumbar facet MBB x2 (06/19/2022) (100/100/100 x 3 days/0) (LBP & LEP improved)    Completed by other providers:   Right sacroiliac joint fusion (canceled on 05/28/2016)  Diagnostic/therapeutic bilateral L4-5 IA facet inj. (12/12/2019) by August Luz, MD ([facility])  Diagnostic/therapeutic left  L4-5 and L5-S1 TFESI (05/22/2021) by August Luz, MD ([facility])    Therapeutic  Palliative (PRN) options:   None established      Recent Visits Date Type Provider Dept  02/26/23 Procedure visit  Delano Metz, MD [facility]  01/20/23 Procedure visit Delano Metz, MD [facility]  Showing recent visits within past 90 days and meeting all other requirements Today's Visits Date Type Provider Dept  04/09/23 Office Visit Delano Metz, MD [facility]  Showing today's visits and meeting all other requirements Future Appointments No visits were found meeting these conditions. Showing future appointments within next 90 days and meeting all other requirements  I discussed the assessment and treatment plan with the patient. The patient was provided an opportunity to ask questions and all were answered. The patient agreed with the plan and demonstrated an understanding of the instructions.  Patient advised to call back or seek an in-person evaluation if the symptoms or condition worsens.  Duration of encounter: 30 minutes.  Total time on encounter, as per AMA guidelines included both the face-to-face and non-face-to-face time personally spent by the physician and/or other qualified health care professional(s) on the day of the encounter (includes time in activities that require the physician or other qualified health care professional and does not include time in activities normally performed by clinical staff). Physician's time may include the following activities when performed: Preparing to see the patient (e.g., pre-charting review of records, searching for previously ordered imaging, lab work, and nerve conduction tests) Review of prior analgesic pharmacotherapies. Reviewing PMP Interpreting ordered tests (e.g., lab work, imaging, nerve conduction tests) Performing post-procedure evaluations, including interpretation of diagnostic procedures Obtaining and/or reviewing separately obtained history Performing a medically appropriate examination and/or evaluation Counseling and educating the patient/family/caregiver Ordering medications, tests, or  procedures Referring and communicating with other health care professionals (when not separately reported) Documenting clinical information in the electronic or other health record Independently interpreting results (not separately reported) and communicating results to the patient/ family/caregiver Care coordination (not separately reported)  Note by: Oswaldo Done, MD Date: 04/09/2023; Time: 1:57 PM

## 2023-04-09 NOTE — Patient Instructions (Signed)
____________________________________________________________________________________________  Muscle Spasms & Cramps  Cause(s):  Most common - vitamin and/or electrolyte (calcium, potassium, sodium, etc.) deficiencies. Post procedure - steroids can make your kidneys excrete electrolytes. If you happen to have been borderline low on your electrolytes, it may temporarily triggering cramps & spasms.  Possible triggers: Sweating - causes loss of electrolytes thru the skin. Steroids - causes loss of electrolytes thru the urine.  Treatment: Gatorade (or any other electrolyte-replenishing drink) - Take 1, 8 oz glass with each meal (3 times a day). OTC (over-the-counter) Magnesium 400 to 500 mg - Take 1 tablet twice a day (one with breakfast and one before bedtime). If you have kidney problems, talk to your primary care physician before taking any Magnesium. Tonic Water with quinine - Take 1, 8 oz glass before bedtime.   ____________________________________________________________________________________________    ______________________________________________________________________  Procedure instructions  Do not eat or drink fluids (other than water) for 6 hours before your procedure  No water for 2 hours before your procedure  Take your blood pressure medicine with a sip of water  Arrive 30 minutes before your appointment  Carefully read the "Preparing for your procedure" detailed instructions  If you have questions call us at [phone]  _____________________________________________________________________    ______________________________________________________________________  Preparing for your procedure  Appointments: If you think you may not be able to keep your appointment, call 24-48 hours in advance to cancel. We need time to make it available to others.  During your procedure appointment there will be: No Prescription Refills. No disability issues to  discussed. No medication changes or discussions.  Instructions: Food intake: Avoid eating anything solid for at least 8 hours prior to your procedure. Clear liquid intake: You may take clear liquids such as water up to 2 hours prior to your procedure. (No carbonated drinks. No soda.) Transportation: Unless otherwise stated by your physician, bring a driver. Morning Medicines: Except for blood thinners, take all of your other morning medications with a sip of water. Make sure to take your heart and blood pressure medicines. If your blood pressure's lower number is above 100, the case will be rescheduled. Blood thinners: Make sure to stop your blood thinners as instructed.  If you take a blood thinner, but were not instructed to stop it, call our office [phone] and ask to talk to a nurse. Not stopping a blood thinner prior to certain procedures could lead to serious complications. Diabetics on insulin: Notify the staff so that you can be scheduled 1st case in the morning. If your diabetes requires high dose insulin, take only  of your normal insulin dose the morning of the procedure and notify the staff that you have done so. Preventing infections: Shower with an antibacterial soap the morning of your procedure.  Build-up your immune system: Take 1000 mg of Vitamin C with every meal (3 times a day) the day prior to your procedure. Antibiotics: Inform the nursing staff if you are taking any antibiotics or if you have any conditions that may require antibiotics prior to procedures. (Example: recent joint implants)   Pregnancy: If you are pregnant make sure to notify the nursing staff. Not doing so may result in injury to the fetus, including death.  Sickness: If you have a cold, fever, or any active infections, call and cancel or reschedule your procedure. Receiving steroids while having an infection may result in complications. Arrival: You must be in the facility at least 30 minutes prior to  your scheduled procedure.  Tardiness: Your scheduled time is also the cutoff time. If you do not arrive at least 15 minutes prior to your procedure, you will be rescheduled.  Children: Do not bring any children with you. Make arrangements to keep them home. Dress appropriately: There is always a possibility that your clothing may get soiled. Avoid long dresses. Valuables: Do not bring any jewelry or valuables.  Reasons to call and reschedule or cancel your procedure: (Following these recommendations will minimize the risk of a serious complication.) Surgeries: Avoid having procedures within 2 weeks of any surgery. (Avoid for 2 weeks before or after any surgery). Flu Shots: Avoid having procedures within 2 weeks of a flu shots or . (Avoid for 2 weeks before or after immunizations). Barium: Avoid having a procedure within 7-10 days after having had a radiological study involving the use of radiological contrast. (Myelograms, Barium swallow or enema study). Heart attacks: Avoid any elective procedures or surgeries for the initial 6 months after a "Myocardial Infarction" (Heart Attack). Blood thinners: It is imperative that you stop these medications before procedures. Let us know if you if you take any blood thinner.  Infection: Avoid procedures during or within two weeks of an infection (including chest colds or gastrointestinal problems). Symptoms associated with infections include: Localized redness, fever, chills, night sweats or profuse sweating, burning sensation when voiding, cough, congestion, stuffiness, runny nose, sore throat, diarrhea, nausea, vomiting, cold or Flu symptoms, recent or current infections. It is specially important if the infection is over the area that we intend to treat. Heart and lung problems: Symptoms that may suggest an active cardiopulmonary problem include: cough, chest pain, breathing difficulties or shortness of breath, dizziness, ankle swelling, uncontrolled high or  unusually low blood pressure, and/or palpitations. If you are experiencing any of these symptoms, cancel your procedure and contact your primary care physician for an evaluation.  Remember:  Regular Business hours are:  Monday to Thursday 8:00 AM to 4:00 PM  Provider's Schedule: Delano Metz, MD:  Procedure days: Tuesday and Thursday 7:30 AM to 4:00 PM  Edward Jolly, MD:  Procedure days: Monday and Wednesday 7:30 AM to 4:00 PM  ______________________________________________________________________    ____________________________________________________________________________________________  General Risks and Possible Complications  Patient Responsibilities: It is important that you read this as it is part of your informed consent. It is our duty to inform you of the risks and possible complications associated with treatments offered to you. It is your responsibility as a patient to read this and to ask questions about anything that is not clear or that you believe was not covered in this document.  Patient's Rights: You have the right to refuse treatment. You also have the right to change your mind, even after initially having agreed to have the treatment done. However, under this last option, if you wait until the last second to change your mind, you may be charged for the materials used up to that point.  Introduction: Medicine is not an Visual merchandiser. Everything in Medicine, including the lack of treatment(s), carries the potential for danger, harm, or loss (which is by definition: Risk). In Medicine, a complication is a secondary problem, condition, or disease that can aggravate an already existing one. All treatments carry the risk of possible complications. The fact that a side effects or complications occurs, does not imply that the treatment was conducted incorrectly. It must be clearly understood that these can happen even when everything is done following the highest safety  standards.  No treatment:  You can choose not to proceed with the proposed treatment alternative. The "PRO(s)" would include: avoiding the risk of complications associated with the therapy. The "CON(s)" would include: not getting any of the treatment benefits. These benefits fall under one of three categories: diagnostic; therapeutic; and/or palliative. Diagnostic benefits include: getting information which can ultimately lead to improvement of the disease or symptom(s). Therapeutic benefits are those associated with the successful treatment of the disease. Finally, palliative benefits are those related to the decrease of the primary symptoms, without necessarily curing the condition (example: decreasing the pain from a flare-up of a chronic condition, such as incurable terminal cancer).  General Risks and Complications: These are associated to most interventional treatments. They can occur alone, or in combination. They fall under one of the following six (6) categories: no benefit or worsening of symptoms; bleeding; infection; nerve damage; allergic reactions; and/or death. No benefits or worsening of symptoms: In Medicine there are no guarantees, only probabilities. No healthcare provider can ever guarantee that a medical treatment will work, they can only state the probability that it may. Furthermore, there is always the possibility that the condition may worsen, either directly, or indirectly, as a consequence of the treatment. Bleeding: This is more common if the patient is taking a blood thinner, either prescription or over the counter (example: Goody Powders, Fish oil, Aspirin, Garlic, etc.), or if suffering a condition associated with impaired coagulation (example: Hemophilia, cirrhosis of the liver, low platelet counts, etc.). However, even if you do not have one on these, it can still happen. If you have any of these conditions, or take one of these drugs, make sure to notify your treating  physician. Infection: This is more common in patients with a compromised immune system, either due to disease (example: diabetes, cancer, human immunodeficiency virus [HIV], etc.), or due to medications or treatments (example: therapies used to treat cancer and rheumatological diseases). However, even if you do not have one on these, it can still happen. If you have any of these conditions, or take one of these drugs, make sure to notify your treating physician. Nerve Damage: This is more common when the treatment is an invasive one, but it can also happen with the use of medications, such as those used in the treatment of cancer. The damage can occur to small secondary nerves, or to large primary ones, such as those in the spinal cord and brain. This damage may be temporary or permanent and it may lead to impairments that can range from temporary numbness to permanent paralysis and/or brain death. Allergic Reactions: Any time a substance or material comes in contact with our body, there is the possibility of an allergic reaction. These can range from a mild skin rash (contact dermatitis) to a severe systemic reaction (anaphylactic reaction), which can result in death. Death: In general, any medical intervention can result in death, most of the time due to an unforeseen complication. ____________________________________________________________________________________________

## 2023-04-09 NOTE — Patient Instructions (Signed)

## 2023-04-21 ENCOUNTER — Encounter: Payer: Self-pay | Admitting: Pain Medicine

## 2023-04-21 ENCOUNTER — Ambulatory Visit: Payer: BC Managed Care – PPO | Attending: Pain Medicine | Admitting: Pain Medicine

## 2023-04-21 ENCOUNTER — Ambulatory Visit
Admission: RE | Admit: 2023-04-21 | Discharge: 2023-04-21 | Disposition: A | Discharge status: Home / Self Care | Payer: BC Managed Care – PPO | Source: Ambulatory Visit | Attending: Pain Medicine | Admitting: Pain Medicine

## 2023-04-21 VITALS — BP 140/88 | HR 67 | Temp 97.0°F | Resp 16 | Ht 66.0 in | Wt 203.0 lb

## 2023-04-21 DIAGNOSIS — M25551 Pain in right hip: Secondary | ICD-10-CM | POA: Insufficient documentation

## 2023-04-21 DIAGNOSIS — M25552 Pain in left hip: Secondary | ICD-10-CM | POA: Insufficient documentation

## 2023-04-21 DIAGNOSIS — M160 Bilateral primary osteoarthritis of hip: Secondary | ICD-10-CM | POA: Insufficient documentation

## 2023-04-21 DIAGNOSIS — G8929 Other chronic pain: Secondary | ICD-10-CM | POA: Insufficient documentation

## 2023-04-21 MED ORDER — IOHEXOL 180 MG/ML  SOLN
10.0000 mL | Freq: Once | INTRAMUSCULAR | Status: AC
  Administered 2023-04-21: 10 mL via INTRA_ARTICULAR
  Filled 2023-04-21: qty 20

## 2023-04-21 MED ORDER — MIDAZOLAM HCL 2 MG/2ML IJ SOLN: 0.5000 mg | Freq: Once | INTRAMUSCULAR | Status: DC

## 2023-04-21 MED ORDER — ROPIVACAINE HCL 2 MG/ML IJ SOLN
18.0000 mL | Freq: Once | INTRAMUSCULAR | Status: AC
  Administered 2023-04-21: 18 mL via INTRA_ARTICULAR
  Filled 2023-04-21: qty 20

## 2023-04-21 MED ORDER — LIDOCAINE HCL 2 % IJ SOLN
20.0000 mL | Freq: Once | INTRAMUSCULAR | Status: AC
  Administered 2023-04-21: 400 mg
  Filled 2023-04-21: qty 20

## 2023-04-21 MED ORDER — METHYLPREDNISOLONE ACETATE 80 MG/ML IJ SUSP
160.0000 mg | Freq: Once | INTRAMUSCULAR | Status: AC
  Administered 2023-04-21: 160 mg via INTRA_ARTICULAR
  Filled 2023-04-21: qty 2

## 2023-04-21 MED ORDER — SODIUM CHLORIDE 0.9 % IV SOLN: Freq: Once | INTRAVENOUS | Status: DC

## 2023-04-21 MED ORDER — PENTAFLUOROPROP-TETRAFLUOROETH EX AERO
INHALATION_SPRAY | Freq: Once | CUTANEOUS | Status: AC
  Administered 2023-04-21: 30 via TOPICAL
  Filled 2023-04-21: qty 30

## 2023-04-21 NOTE — Progress Notes (Signed)
PROVIDER NOTE: Interpretation of information contained herein should be left to medically-trained personnel. Specific patient instructions are provided elsewhere under "Patient Instructions" section of medical record. This document was created in part using STT-dictation technology, any transcriptional errors that may result from this process are unintentional.  Patient: Donna Cannon Type: Established DOB: 16-Jan-1966 MRN: [id number] PCP: Rolm Gala, MD  Service: Procedure DOS: 04/21/2023 Setting: Ambulatory Location: Ambulatory outpatient facility Delivery: Face-to-face Provider: Oswaldo Done, MD Specialty: Interventional Pain Management Specialty designation: 09 Location: Outpatient facility Ref. Prov.: Rolm Gala, MD       Interventional Therapy   Type: Hip & bursae injection #1  Laterality: Bilateral (-50)  Bursae: Trochanteric  Laterality: Bilateral (-50)  Approach: Percutaneous posterolateral approach. Level: Lower pelvic and hip joint level.  Imaging: Fluoroscopy-guided         Anesthesia: Local anesthesia (1-2% Lidocaine) Anxiolysis: None                 Sedation: No Sedation                       DOS: 04/21/2023  Performed by: Oswaldo Done, MD  Purpose: Diagnostic/Therapeutic Indications: Hip pain severe enough to impact quality of life or function. Rationale (medical necessity): procedure needed and proper for the diagnosis and/or treatment of Donna Cannon medical symptoms and needs. 1. Chronic hip pain (Bilateral)   2. Osteoarthritis of hips (Bilateral)   3. Chronic hip pain, bilateral   4. Primary osteoarthritis of both hips    NAS-11 Pain score:   Pre-procedure: 6 /10   Post-procedure: 6 /10      Target: Intra-articular aspect of the hip joint & peri-articular bursae Region: Hip joint proper. Femoral region Procedure Type: Percutaneous injection   Position / Prep / Materials:  Position: Prone  Prep solution: DuraPrep (Iodine Povacrylex  [0.7% available iodine] and Isopropyl Alcohol, 74% w/w) Prep Area:  Entire Posterolateral hip area. Materials:  Tray: Block tray Needle(s):  Type: Spinal  Gauge (G): 22  Length: 7-in  Qty: 2  Pre-op H&P Assessment:  Donna Cannon is a 57 y.o. (year old), female patient, seen today for interventional treatment. She  has a past surgical history that includes Cesarean section; Tubal ligation; Endometrial ablation; Back surgery; Colonoscopy w/ biopsies and polypectomy; and Esophagogastroduodenoscopy (egd) with propofol (N/A, 12/12/2016). Donna Cannon has a current medication list which includes the following prescription(s): allopurinol, calcium carbonate, cholecalciferol, losartan, metoprolol succinate, pantoprazole, senna-docusate, and levothyroxine, and the following Facility-Administered Medications: sodium chloride and midazolam. Her primarily concern today is the Hip Pain (Bilateral )  Initial Vital Signs:  Pulse/HCG Rate: 75  Temp: (!) 97 F (36.1 C) Resp: 18 BP: 122/72 SpO2: 100 %  BMI: Estimated body mass index is 32.77 kg/m as calculated from the following:   Height as of this encounter: 5\' 6"  (1.676 m).   Weight as of this encounter: 203 lb (92.1 kg).  Risk Assessment: Allergies: Reviewed. She is allergic to hydrocodone.  Allergy Precautions: None required Coagulopathies: Reviewed. None identified.  Blood-thinner therapy: None at this time Active Infection(s): Reviewed. None identified. Donna Cannon is afebrile  Site Confirmation: Donna Cannon was asked to confirm the procedure and laterality before marking the site Procedure checklist: Completed Consent: Before the procedure and under the influence of no sedative(s), amnesic(s), or anxiolytics, the patient was informed of the treatment options, risks and possible complications. To fulfill our ethical and legal obligations, as recommended by the American Medical Association's Code of  Ethics, I have informed the patient of my clinical  impression; the nature and purpose of the treatment or procedure; the risks, benefits, and possible complications of the intervention; the alternatives, including doing nothing; the risk(s) and benefit(s) of the alternative treatment(s) or procedure(s); and the risk(s) and benefit(s) of doing nothing. The patient was provided information about the general risks and possible complications associated with the procedure. These may include, but are not limited to: failure to achieve desired goals, infection, bleeding, organ or nerve damage, allergic reactions, paralysis, and death. In addition, the patient was informed of those risks and complications associated to the procedure, such as failure to decrease pain; infection; bleeding; organ or nerve damage with subsequent damage to sensory, motor, and/or autonomic systems, resulting in permanent pain, numbness, and/or weakness of one or several areas of the body; allergic reactions; (i.e.: anaphylactic reaction); and/or death. Furthermore, the patient was informed of those risks and complications associated with the medications. These include, but are not limited to: allergic reactions (i.e.: anaphylactic or anaphylactoid reaction(s)); adrenal axis suppression; blood sugar elevation that in diabetics may result in ketoacidosis or comma; water retention that in patients with history of congestive heart failure may result in shortness of breath, pulmonary edema, and decompensation with resultant heart failure; weight gain; swelling or edema; medication-induced neural toxicity; particulate matter embolism and blood vessel occlusion with resultant organ, and/or nervous system infarction; and/or aseptic necrosis of one or more joints. Finally, the patient was informed that Medicine is not an exact science; therefore, there is also the possibility of unforeseen or unpredictable risks and/or possible complications that may result in a catastrophic outcome. The patient  indicated having understood very clearly. We have given the patient no guarantees and we have made no promises. Enough time was given to the patient to ask questions, all of which were answered to the patient's satisfaction. Ms. Desmet has indicated that she wanted to continue with the procedure. Attestation: I, the ordering provider, attest that I have discussed with the patient the benefits, risks, side-effects, alternatives, likelihood of achieving goals, and potential problems during recovery for the procedure that I have provided informed consent. Date  Time: 04/21/2023  9:38 AM  Pre-Procedure Preparation:  Monitoring: As per clinic protocol. Respiration, ETCO2, SpO2, BP, heart rate and rhythm monitor placed and checked for adequate function Safety Precautions: Patient was assessed for positional comfort and pressure points before starting the procedure. Time-out: I initiated and conducted the "Time-out" before starting the procedure, as per protocol. The patient was asked to participate by confirming the accuracy of the "Time Out" information. Verification of the correct person, site, and procedure were performed and confirmed by me, the nursing staff, and the patient. "Time-out" conducted as per Joint Commission's Universal Protocol (UP.01.01.01). Time: 1055 Start Time: 1055 hrs.  Narrative                Rationale (medical necessity): procedure needed and proper for the diagnosis and/or treatment of the patient's medical symptoms and needs. Procedural Technique Safety Precautions: Aspiration looking for blood return was conducted prior to all injections. At no point did we inject any substances, as a needle was being advanced. No attempts were made at seeking any paresthesias. Safe injection practices and needle disposal techniques used. Medications properly checked for expiration dates. SDV (single dose vial) medications used. Description of the Procedure: Protocol guidelines were followed. The  patient was assisted into a comfortable position. The target area was identified and the area prepped in the  usual manner. Skin & deeper tissues infiltrated with local anesthetic. Appropriate amount of time allowed to pass for local anesthetics to take effect. The procedure needles were then advanced to the target area. Proper needle placement secured. Negative aspiration confirmed. Solution injected in intermittent fashion, asking for systemic symptoms every 0.5cc of injectate. The needles were then removed and the area cleansed, making sure to leave some of the prepping solution back to take advantage of its long term bactericidal properties.  Technical description of procedure:  Skin & deeper tissues infiltrated with local anesthetic. Appropriate amount of time allowed to pass for local anesthetics to take effect. The procedure needles were then advanced to the target area. Proper needle placement secured. Negative aspiration confirmed. Solution injected in intermittent fashion, asking for systemic symptoms every 0.5cc of injectate. The needles were then removed and the area cleansed, making sure to leave some of the prepping solution back to take advantage of its long term bactericidal properties.             Vitals:   04/21/23 0936 04/21/23 1053 04/21/23 1101 04/21/23 1108  BP: 122/72 (!) 139/93 (!) 142/97 (!) 140/88  Pulse: 75 69 67 67  Resp: 18 16 15 16   Temp: (!) 97 F (36.1 C)     TempSrc: Temporal     SpO2: 100% 100% 98% 99%  Weight: 203 lb (92.1 kg)     Height: 5\' 6"  (1.676 m)        Start Time: 1055 hrs. End Time: 1104 hrs.  Imaging Guidance (Non-Spinal):          Type of Imaging Technique: Fluoroscopy Guidance (Non-Spinal) Indication(s): Assistance in needle guidance and placement for procedures requiring needle placement in or near specific anatomical locations not easily accessible without such assistance. Exposure Time: Please see nurses notes. Contrast: Before  injecting any contrast, we confirmed that the patient did not have an allergy to iodine, shellfish, or radiological contrast. Once satisfactory needle placement was completed at the desired level, radiological contrast was injected. Contrast injected under live fluoroscopy. No contrast complications. See chart for type and volume of contrast used. Fluoroscopic Guidance: I was personally present during the use of fluoroscopy. "Tunnel Vision Technique" used to obtain the best possible view of the target area. Parallax error corrected before commencing the procedure. "Direction-depth-direction" technique used to introduce the needle under continuous pulsed fluoroscopy. Once target was reached, antero-posterior, oblique, and lateral fluoroscopic projection used confirm needle placement in all planes. Images permanently stored in EMR. Interpretation: I personally interpreted the imaging intraoperatively. Adequate needle placement confirmed in multiple planes. Appropriate spread of contrast into desired area was observed. No evidence of afferent or efferent intravascular uptake. Permanent images saved into the patient's record.  Post-operative Assessment:  Post-procedure Vital Signs:  Pulse/HCG Rate: 67  Temp: (!) 97 F (36.1 C) Resp: 16 BP: (!) 140/88 SpO2: 99 %  EBL: None  Complications: No immediate post-treatment complications observed by team, or reported by patient.  Note: The patient tolerated the entire procedure well. A repeat set of vitals were taken after the procedure and the patient was kept under observation following institutional policy, for this type of procedure. Post-procedural neurological assessment was performed, showing return to baseline, prior to discharge. The patient was provided with post-procedure discharge instructions, including a section on how to identify potential problems. Should any problems arise concerning this procedure, the patient was given instructions to  immediately contact us, at any time, without hesitation. In any case, we plan  to contact the patient by telephone for a follow-up status report regarding this interventional procedure.  Comments:  No additional relevant information.  Plan of Care (POC)  Orders:  Orders Placed This Encounter  Procedures   HIP INJECTION    Scheduling Instructions:     Side: Bilateral     Sedation: Patient's choice.     Timeframe: Today   DG PAIN [facility] C-ARM 1-60 MIN NO REPORT    Intraoperative interpretation by procedural physician at [facility].    Standing Status:   Standing    Number of Occurrences:   1    Order Specific Question:   Reason for exam:    Answer:   Assistance in needle guidance and placement for procedures requiring needle placement in or near specific anatomical locations not easily accessible without such assistance.   Informed Consent Details: Physician/Practitioner Attestation; Transcribe to consent form and obtain patient signature    Nursing Order: Transcribe to consent form and obtain patient signature. Note: Always confirm laterality of pain with Ms. Hugg, before procedure.    Order Specific Question:   Physician/Practitioner attestation of informed consent for procedure/surgical case    Answer:   I, the physician/practitioner, attest that I have discussed with the patient the benefits, risks, side effects, alternatives, likelihood of achieving goals and potential problems during recovery for the procedure that I have provided informed consent.    Order Specific Question:   Procedure    Answer:   Hip injection    Order Specific Question:   Physician/Practitioner performing the procedure    Answer:   Rhyleigh Grassel A. Laban Emperor, MD    Order Specific Question:   Indication/Reason    Answer:   Hip Joint Pain (Arthralgia)   Provide equipment / supplies at bedside    Procedure tray: "Block Tray" (Disposable  single use) Skin infiltration needle: Regular 1.5-in, 25-G,  (x1) Block Needle type: Spinal Amount/quantity: 2 Size: Long (7-inch) Gauge: 22G    Standing Status:   Standing    Number of Occurrences:   1    Order Specific Question:   Specify    Answer:   Block Tray   Chronic Opioid Analgesic:  None MME/day: 0 mg/day   Medications ordered for procedure: Meds ordered this encounter  Medications   iohexol (OMNIPAQUE) 180 MG/ML injection 10 mL    Must be Myelogram-compatible. If not available, you may substitute with a water-soluble, non-ionic, hypoallergenic, myelogram-compatible radiological contrast medium.   lidocaine (XYLOCAINE) 2 % (with pres) injection 400 mg   pentafluoroprop-tetrafluoroeth (GEBAUERS) aerosol   0.9 %  sodium chloride infusion   ropivacaine (PF) 2 mg/mL (0.2%) (NAROPIN) injection 18 mL   methylPREDNISolone acetate (DEPO-MEDROL) injection 160 mg   midazolam (VERSED) injection 0.5-2 mg    Make sure Flumazenil is available in the pyxis when using this medication. If oversedation occurs, administer 0.2 mg IV over 15 sec. If after 45 sec no response, administer 0.2 mg again over 1 min; may repeat at 1 min intervals; not to exceed 4 doses (1 mg)   Medications administered: We administered iohexol, lidocaine, pentafluoroprop-tetrafluoroeth, ropivacaine (PF) 2 mg/mL (0.2%), and methylPREDNISolone acetate.  See the medical record for exact dosing, route, and time of administration.  Follow-up plan:   Return in about 2 weeks (around 05/05/2023) for Proc-day (T,Th), (Face2F), (PPE).       Interventional Therapies  Risk Factors  Considerations:   Allergy  Intolerance: Hydrocodone  Decreased eGFR  Stage III CKD Elevated LFT  GERD Mitral  Valve Prolapse  PVCs     WNL   Planned  Pending:      Under consideration:   Diagnostic midline caudal ESI + epidurogram #1  Diagnostic bilateral sacroiliac joint Blk #1  Diagnostic bilateral IA hip inj. #1  Diagnostic left cervical ESI #1  Diagnostic bilateral cervical facet MBB  #1    Completed:   Therapeutic right (L3-4, L4-5, L5-S1) lumbar facet (L2-S1) MB- RFA x1 (02/26/2023)  Therapeutic left (L3-4, L4-5, L5-S1) lumbar facet (L2-S1) MB- RFA x1 (01/20/2023) (100/100/100/LBP:90Hip:50) Diagnostic bilateral lumbar facet MBB x2 (06/19/2022) (100/100/100 x 3 days/0) (LBP & LEP improved)    Completed by other providers:   Right sacroiliac joint fusion (canceled on 05/28/2016)  Diagnostic/therapeutic bilateral L4-5 IA facet inj. (12/12/2019) by August Luz, MD ([facility])  Diagnostic/therapeutic left L4-5 and L5-S1 TFESI (05/22/2021) by August Luz, MD ([facility])    Therapeutic  Palliative (PRN) options:   None established       Recent Visits Date Type Provider Dept  04/09/23 Office Visit Delano Metz, MD [facility]  02/26/23 Procedure visit Delano Metz, MD [facility]  Showing recent visits within past 90 days and meeting all other requirements Today's Visits Date Type Provider Dept  04/21/23 Procedure visit Delano Metz, MD [facility]  Showing today's visits and meeting all other requirements Future Appointments Date Type Provider Dept  05/19/23 Appointment Delano Metz, MD [facility]  Showing future appointments within next 90 days and meeting all other requirements  Disposition: Discharge home  Discharge (Date  Time): 04/21/2023; 1109 hrs.   Primary Care Physician: Rolm Gala, MD Location: [facility] Note by: Oswaldo Done, MD (TTS technology used. I apologize for any typographical errors that were not detected and corrected.) Date: 04/21/2023; Time: 11:12 AM  Disclaimer:  Medicine is not an Visual merchandiser. The only guarantee in medicine is that nothing is guaranteed. It is important to note that the decision to proceed with this intervention was based on the information collected from the patient. The Data and conclusions were  drawn from the patient's questionnaire, the interview, and the physical examination. Because the information was provided in large part by the patient, it cannot be guaranteed that it has not been purposely or unconsciously manipulated. Every effort has been made to obtain as much relevant data as possible for this evaluation. It is important to note that the conclusions that lead to this procedure are derived in large part from the available data. Always take into account that the treatment will also be dependent on availability of resources and existing treatment guidelines, considered by other Pain Management Practitioners as being common knowledge and practice, at the time of the intervention. For Medico-Legal purposes, it is also important to point out that variation in procedural techniques and pharmacological choices are the acceptable norm. The indications, contraindications, technique, and results of the above procedure should only be interpreted and judged by a Board-Certified Interventional Pain Specialist with extensive familiarity and expertise in the same exact procedure and technique.

## 2023-04-21 NOTE — Patient Instructions (Signed)

## 2023-04-21 NOTE — Progress Notes (Signed)
Safety precautions to be maintained throughout the outpatient stay will include: orient to surroundings, keep bed in low position, maintain call bell within reach at all times, provide assistance with transfer out of bed and ambulation.  

## 2023-04-22 ENCOUNTER — Telehealth: Payer: Self-pay | Admitting: *Deleted

## 2023-04-22 NOTE — Telephone Encounter (Signed)
No problems post procedure. 

## 2023-05-18 NOTE — Progress Notes (Unsigned)
PROVIDER NOTE: Information contained herein reflects review and annotations entered in association with encounter. Interpretation of such information and data should be left to medically-trained personnel. Information provided to patient can be located elsewhere in the medical record under "Patient Instructions". Document created using STT-dictation technology, any transcriptional errors that may result from process are unintentional.    Patient: Donna Cannon  Service Category: E/M  Provider: Oswaldo Done, MD  DOB: 1966-03-15  DOS: 05/19/2023  Referring Provider: Rolm Gala, MD  MRN: [id number]  Specialty: Interventional Pain Management  PCP: Rolm Gala, MD  Type: Established Patient  Setting: Ambulatory outpatient    Location: Office  Delivery: Face-to-face     HPI  [name], a 57 y.o. year old female, is here today because of her Chronic hip pain, bilateral [M25.551, M25.552, G89.29]. [name] primary complain today is No chief complaint on file.  Pertinent problems: Ms. 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; Radiculopathy of cervical region; Spinal stenosis of cervical region; Osteopenia of multiple sites; Spondylosis of lumbar region without myelopathy or radiculopathy; Thoracic spine pain; Chronic pain syndrome; Abnormal MRI, cervical spine (11/18/2021); Abnormal MRI, lumbar spine (10/16/2019); Lumbosacral facet arthropathy (Multilevel) (Bilateral); 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; Lumbar foraminal stenosis (L4-5) (Bilateral); DDD (degenerative disc disease), lumbosacral; Cervical facet hypertrophy; DDD (degenerative disc disease), cervical; Chronic feet pain (5th area of Pain) (Bilateral); Chronic wrist pain  (7th area of Pain) (Right); Chronic low back pain (1ry area of Pain) (Bilateral) (L>R) w/o sciatica; 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); Chronic lower extremity pain (2ry area of Pain) (Bilateral) (L>R); Chronic lower extremity radicular pain (S1 dermatome) (Bilateral); Chronic upper extremity pain (3ry area of Pain) (Left); Chronic neck pain (4th area of Pain) (Posterior) (Bilateral) (L>R); Chronic ankle pain (6th area of Pain) (Bilateral); Grade 1 Retrolisthesis of cervical spine (C3/C4 and C5/C6); Decreased range of motion of lumbar spine; Painful cervical range of motion; Impaired range of motion of cervical spine; Lumbar facet syndrome; Chronic hip pain (Bilateral); Chronic sacroiliac joint pain (Bilateral); and Osteoarthritis of hips (Bilateral) on their pertinent problem list. Pain Assessment: Severity of   is reported as a  /10. Location:    / . Onset:  . Quality:  . Timing:  . Modifying factor(s):  Marland Kitchen Vitals:  vitals were not taken for this visit.  BMI: Estimated body mass index is 32.77 kg/m as calculated from the following:   Height as of 04/21/23: 5\' 6"  (1.676 m).   Weight as of 04/21/23: 203 lb (92.1 kg). Last encounter: 04/09/2023. Last procedure: 04/21/2023.  Reason for encounter: post-procedure evaluation and assessment. ***  Post-procedure evaluation   Type: Hip & bursae injection #1  Laterality: Bilateral (-50)  Bursae: Trochanteric  Laterality: Bilateral (-50)  Approach: Percutaneous posterolateral approach. Level: Lower pelvic and hip joint level.  Imaging: Fluoroscopy-guided         Anesthesia: Local anesthesia (1-2% Lidocaine) Anxiolysis: None                 Sedation: No Sedation                       DOS: 04/21/2023  Performed by: Oswaldo Done, MD  Purpose: Diagnostic/Therapeutic Indications: Hip pain severe enough to impact quality of life or  function. Rationale (medical necessity): procedure needed and proper for the diagnosis and/or treatment of  [name] medical symptoms and needs. 1. Chronic hip pain (Bilateral)   2. Osteoarthritis of hips (Bilateral)   3. Chronic hip pain, bilateral   4. Primary osteoarthritis of both hips    NAS-11 Pain score:   Pre-procedure: 6 /10   Post-procedure: 6 /10       Effectiveness:  Initial hour after procedure:   ***. Subsequent 4-6 hours post-procedure:   ***. Analgesia past initial 6 hours:   ***. Ongoing improvement:  Analgesic:  *** Function:    ***    ROM:    ***     Pharmacotherapy Assessment  Analgesic: None MME/day: 0 mg/day   Monitoring: Trail PMP: PDMP reviewed during this encounter.       Pharmacotherapy: No side-effects or adverse reactions reported. Compliance: No problems identified. Effectiveness: Clinically acceptable.  No notes on file  No results found for: "CBDTHCR" No results found for: "D8THCCBX" No results found for: "D9THCCBX"  UDS:  Summary  Date Value Ref Range Status  03/17/2022 Note  Final    Comment:    ==================================================================== Compliance Drug Analysis, Ur ==================================================================== Test                             Result       Flag       Units  Drug Present and Declared for Prescription Verification   Cyclobenzaprine                PRESENT      EXPECTED   Desmethylcyclobenzaprine       PRESENT      EXPECTED    Desmethylcyclobenzaprine is an expected metabolite of    cyclobenzaprine.  Drug Present not Declared for Prescription Verification   Diphenhydramine                PRESENT      UNEXPECTED   Metoprolol                     PRESENT      UNEXPECTED ==================================================================== Test                      Result    Flag   Units      Ref Range   Creatinine              68               mg/dL      >=16 ==================================================================== Declared Medications:  The flagging and  interpretation on this report are based on the  following declared medications.  Unexpected results may arise from  inaccuracies in the declared medications.   **Note: The testing scope of this panel includes these medications:   Cyclobenzaprine (Flexeril)   **Note: The testing scope of this panel does not include the  following reported medications:   Allopurinol (Zyloprim)  Calcium  Colchicine  Cyanocobalamin  Levothyroxine (Synthroid)  Losartan (Cozaar)  Pantoprazole (Protonix)  Sennosides (Senokot)  Vitamin D3 ==================================================================== For clinical consultation, please call [phone]. ====================================================================       ROS  Constitutional: Denies any fever or chills Gastrointestinal: No reported hemesis, hematochezia, vomiting, or acute GI distress Musculoskeletal: Denies any acute onset joint swelling, redness, loss of ROM, or weakness Neurological: No reported episodes of acute onset apraxia, aphasia, dysarthria, agnosia, amnesia, paralysis, loss of  coordination, or loss of consciousness  Medication Review  allopurinol, calcium carbonate, cholecalciferol, levothyroxine, losartan, metoprolol succinate, pantoprazole, and senna-docusate  History Review  Allergy: [name] is allergic to hydrocodone. Drug: [name]  reports no history of drug use. Alcohol:  reports current alcohol use. Tobacco:  reports that she has never smoked. She has never used smokeless tobacco. Social: [name]  reports that she has never smoked. She has never used smokeless tobacco. She reports current alcohol use. She reports that she does not use drugs. Medical:  has a past medical history of Anxiety, Arthritis, Cancer (HCC), GERD (gastroesophageal reflux disease), Hypertension, Hypothyroidism, Mitral valve prolapse, PONV (postoperative nausea and vomiting), Sacroiliac dysfunction, Spinal headache, and Wears  glasses. Surgical: [name]  has a past surgical history that includes Cesarean section; Tubal ligation; Endometrial ablation; Back surgery; Colonoscopy w/ biopsies and polypectomy; and Esophagogastroduodenoscopy (egd) with propofol (N/A, 12/12/2016). Family: family history includes Breast cancer in her mother; Colon cancer in her brother; Hypertension in her mother and sister.  Laboratory Chemistry Profile   Renal Lab Results  Component Value Date   BUN 14 03/17/2022   CREATININE 1.10 (H) 03/17/2022   GFRAA >60 05/20/2016   GFRNONAA 59 (L) 03/17/2022    Hepatic Lab Results  Component Value Date   AST 69 (H) 03/17/2022   ALT 104 (H) 03/17/2022   ALBUMIN 4.7 03/17/2022   ALKPHOS 88 03/17/2022    Electrolytes Lab Results  Component Value Date   NA 140 03/17/2022   K 4.3 03/17/2022   CL 105 03/17/2022   CALCIUM 10.0 03/17/2022   MG 2.0 03/17/2022    Bone Lab Results  Component Value Date   25OHVITD1 67 03/17/2022   25OHVITD2 61 03/17/2022   25OHVITD3 5.7 03/17/2022    Inflammation (CRP: Acute Phase) (ESR: Chronic Phase) Lab Results  Component Value Date   CRP 0.8 03/17/2022   ESRSEDRATE 7 03/17/2022         Note: Above Lab results reviewed.  Recent Imaging Review  DG PAIN CLINIC C-ARM 1-60 MIN NO REPORT Fluoro was used, but no Radiologist interpretation will be provided.  Please refer to "NOTES" tab for provider progress note. Note: Reviewed        Physical Exam  General appearance: Well nourished, well developed, and well hydrated. In no apparent acute distress Mental status: Alert, oriented x 3 (person, place, & time)       Respiratory: No evidence of acute respiratory distress Eyes: PERLA Vitals: There were no vitals taken for this visit. BMI: Estimated body mass index is 32.77 kg/m as calculated from the following:   Height as of 04/21/23: 5\' 6"  (1.676 m).   Weight as of 04/21/23: 203 lb (92.1 kg). Ideal: Patient weight not recorded  Assessment    Diagnosis Status  1. Chronic hip pain (Bilateral)   2. Osteoarthritis of hips (Bilateral)   3. Postop check    Controlled Controlled Controlled   Updated Problems: No problems updated.  Plan of Care  Problem-specific:  No problem-specific Assessment & Plan notes found for this encounter.  [name] has a current medication list which includes the following long-term medication(s): allopurinol, calcium carbonate, levothyroxine, losartan, and pantoprazole.  Pharmacotherapy (Medications Ordered): No orders of the defined types were placed in this encounter.  Orders:  No orders of the defined types were placed in this encounter.  Follow-up plan:   No follow-ups on file.      Interventional Therapies  Risk Factors  Considerations:  Allergy  Intolerance: Hydrocodone  Decreased eGFR  Stage III CKD Elevated LFT  GERD Mitral Valve Prolapse  PVCs     WNL   Planned  Pending:      Under consideration:   Diagnostic midline caudal ESI + epidurogram #1  Diagnostic bilateral sacroiliac joint Blk #1  Diagnostic bilateral IA hip inj. #1  Diagnostic left cervical ESI #1  Diagnostic bilateral cervical facet MBB #1    Completed:   Therapeutic right (L3-4, L4-5, L5-S1) lumbar facet (L2-S1) MB- RFA x1 (02/26/2023)  Therapeutic left (L3-4, L4-5, L5-S1) lumbar facet (L2-S1) MB- RFA x1 (01/20/2023) (100/100/100/LBP:90Hip:50) Diagnostic bilateral lumbar facet MBB x2 (06/19/2022) (100/100/100 x 3 days/0) (LBP & LEP improved)    Completed by other providers:   Right sacroiliac joint fusion (canceled on 05/28/2016)  Diagnostic/therapeutic bilateral L4-5 IA facet inj. (12/12/2019) by August Luz, MD ([facility])  Diagnostic/therapeutic left L4-5 and L5-S1 TFESI (05/22/2021) by August Luz, MD ([facility])    Therapeutic  Palliative (PRN) options:   None established        Recent Visits Date Type Provider Dept  04/21/23 Procedure visit Delano Metz, MD [facility]  04/09/23 Office Visit Delano Metz, MD [facility]  02/26/23 Procedure visit Delano Metz, MD [facility]  Showing recent visits within past 90 days and meeting all other requirements Future Appointments Date Type Provider Dept  05/19/23 Appointment Delano Metz, MD [facility]  Showing future appointments within next 90 days and meeting all other requirements  I discussed the assessment and treatment plan with the patient. The patient was provided an opportunity to ask questions and all were answered. The patient agreed with the plan and demonstrated an understanding of the instructions.  Patient advised to call back or seek an in-person evaluation if the symptoms or condition worsens.  Duration of encounter: *** minutes.  Total time on encounter, as per AMA guidelines included both the face-to-face and non-face-to-face time personally spent by the physician and/or other qualified health care professional(s) on the day of the encounter (includes time in activities that require the physician or other qualified health care professional and does not include time in activities normally performed by clinical staff). Physician's time may include the following activities when performed: Preparing to see the patient (e.g., pre-charting review of records, searching for previously ordered imaging, lab work, and nerve conduction tests) Review of prior analgesic pharmacotherapies. Reviewing PMP Interpreting ordered tests (e.g., lab work, imaging, nerve conduction tests) Performing post-procedure evaluations, including interpretation of diagnostic procedures Obtaining and/or reviewing separately obtained history Performing a medically appropriate examination and/or evaluation Counseling and educating the patient/family/caregiver Ordering medications, tests, or procedures Referring and communicating with other health care  professionals (when not separately reported) Documenting clinical information in the electronic or other health record Independently interpreting results (not separately reported) and communicating results to the patient/ family/caregiver Care coordination (not separately reported)  Note by: Oswaldo Done, MD Date: 05/19/2023; Time: 6:26 PM

## 2023-05-19 ENCOUNTER — Ambulatory Visit: Payer: BC Managed Care – PPO | Attending: Pain Medicine | Admitting: Pain Medicine

## 2023-05-19 ENCOUNTER — Encounter: Payer: Self-pay | Admitting: Pain Medicine

## 2023-05-19 VITALS — BP 132/82 | HR 69 | Temp 97.9°F | Resp 18 | Ht 66.0 in | Wt 199.0 lb

## 2023-05-19 DIAGNOSIS — G8929 Other chronic pain: Secondary | ICD-10-CM | POA: Diagnosis present

## 2023-05-19 DIAGNOSIS — M9904 Segmental and somatic dysfunction of sacral region: Secondary | ICD-10-CM | POA: Diagnosis present

## 2023-05-19 DIAGNOSIS — M533 Sacrococcygeal disorders, not elsewhere classified: Secondary | ICD-10-CM | POA: Insufficient documentation

## 2023-05-19 DIAGNOSIS — M160 Bilateral primary osteoarthritis of hip: Secondary | ICD-10-CM

## 2023-05-19 DIAGNOSIS — Z09 Encounter for follow-up examination after completed treatment for conditions other than malignant neoplasm: Secondary | ICD-10-CM | POA: Insufficient documentation

## 2023-05-19 DIAGNOSIS — M25552 Pain in left hip: Secondary | ICD-10-CM | POA: Diagnosis present

## 2023-05-19 DIAGNOSIS — M25551 Pain in right hip: Secondary | ICD-10-CM | POA: Diagnosis present

## 2023-05-19 NOTE — Patient Instructions (Signed)

## 2023-06-08 NOTE — Progress Notes (Unsigned)
PROVIDER NOTE: Interpretation of information contained herein should be left to medically-trained personnel. Specific patient instructions are provided elsewhere under "Patient Instructions" section of medical record. This document was created in part using STT-dictation technology, any transcriptional errors that may result from this process are unintentional.  Patient: Donna Cannon Type: Established DOB: 03/15/1966 MRN: [id number] PCP: Rolm Gala, MD  Service: Procedure DOS: 06/09/2023 Setting: Ambulatory Location: Ambulatory outpatient facility Delivery: Face-to-face Provider: Oswaldo Done, MD Specialty: Interventional Pain Management Specialty designation: 09 Location: Outpatient facility Ref. Prov.: Delano Metz, MD       Interventional Therapy   Procedure: Sacroiliac Joint Steroid Injection #1    Laterality: Bilateral     Level: PSIS (Posterior Superior Iliac Spine)  Imaging: Fluoroscopic guidance Anesthesia: Local anesthesia (1-2% Lidocaine) Anxiolysis: IV Versed 2.0 mg Sedation: Moderate Sedation Fentanyl 1 mL (50 mcg) DOS: 06/09/2023  Performed by: Oswaldo Done, MD  Purpose: Diagnostic/Therapeutic Indications: Sacroiliac joint pain in the lower back and hip area severe enough to impact quality of life or function. Rationale (medical necessity): procedure needed and proper for the diagnosis and/or treatment of [name] medical symptoms and needs. 1. Chronic low back pain (1ry area of Pain) (Bilateral) (L>R) w/o sciatica   2. Chronic sacroiliac joint pain (Bilateral)   3. Sacroiliac joint dysfunction (Bilateral)   4. Somatic dysfunction of sacroiliac joints (Bilateral)   5. Other spondylosis, sacral and sacrococcygeal region    NAS-11 Pain score:   Pre-procedure: 5 /10   Post-procedure: 0-No pain/10     Target: Interarticular sacroiliac joint. Location: Medial to the postero-medial edge of iliac spine. Region:  Lumbosacral-sacrococcygeal. Approach: Superior postero-medial percutaneous approach. Type of procedure: Percutaneous joint injection.  Position / Prep / Materials:  Position: Prone  Prep solution: DuraPrep (Iodine Povacrylex [0.7% available iodine] and Isopropyl Alcohol, 74% w/w) Prep Area: Entire posterior lumbosacral area  Materials:  Tray: Block Needle(s):  Type: Spinal  Gauge (G): 22  Length: 5-in Qty: 2  Pre-op H&P Assessment:  [name] is a 57 y.o. (year old), female patient, seen today for interventional treatment. She  has a past surgical history that includes Cesarean section; Tubal ligation; Endometrial ablation; Back surgery; Colonoscopy w/ biopsies and polypectomy; and Esophagogastroduodenoscopy (egd) with propofol (N/A, 12/12/2016). [name] has a current medication list which includes the following prescription(s): allopurinol, calcium carbonate, cholecalciferol, losartan, metoprolol succinate, pantoprazole, senna-docusate, and levothyroxine, and the following Facility-Administered Medications: fentanyl and lactated ringers. Her primarily concern today is the Hip Pain (bilateral)  Initial Vital Signs:  Pulse/HCG Rate: 64ECG Heart Rate: 69 (NSR) Temp: (!) 96.9 F (36.1 C) Resp: 16 BP: 116/79 SpO2: 100 %  BMI: Estimated body mass index is 32.12 kg/m as calculated from the following:   Height as of this encounter: 5\' 6"  (1.676 m).   Weight as of this encounter: 199 lb (90.3 kg).  Risk Assessment: Allergies: Reviewed. She is allergic to hydrocodone.  Allergy Precautions: None required Coagulopathies: Reviewed. None identified.  Blood-thinner therapy: None at this time Active Infection(s): Reviewed. None identified. [name] is afebrile  Site Confirmation: [name] was asked to confirm the procedure and laterality before marking the site Procedure checklist: Completed Consent: Before the procedure and under the influence of no sedative(s), amnesic(s), or  anxiolytics, the patient was informed of the treatment options, risks and possible complications. To fulfill our ethical and legal obligations, as recommended by the American Medical Association's Code of Ethics, I have informed the patient of my clinical impression; the nature and  purpose of the treatment or procedure; the risks, benefits, and possible complications of the intervention; the alternatives, including doing nothing; the risk(s) and benefit(s) of the alternative treatment(s) or procedure(s); and the risk(s) and benefit(s) of doing nothing. The patient was provided information about the general risks and possible complications associated with the procedure. These may include, but are not limited to: failure to achieve desired goals, infection, bleeding, organ or nerve damage, allergic reactions, paralysis, and death. In addition, the patient was informed of those risks and complications associated to the procedure, such as failure to decrease pain; infection; bleeding; organ or nerve damage with subsequent damage to sensory, motor, and/or autonomic systems, resulting in permanent pain, numbness, and/or weakness of one or several areas of the body; allergic reactions; (i.e.: anaphylactic reaction); and/or death. Furthermore, the patient was informed of those risks and complications associated with the medications. These include, but are not limited to: allergic reactions (i.e.: anaphylactic or anaphylactoid reaction(s)); adrenal axis suppression; blood sugar elevation that in diabetics may result in ketoacidosis or comma; water retention that in patients with history of congestive heart failure may result in shortness of breath, pulmonary edema, and decompensation with resultant heart failure; weight gain; swelling or edema; medication-induced neural toxicity; particulate matter embolism and blood vessel occlusion with resultant organ, and/or nervous system infarction; and/or aseptic necrosis of one or  more joints. Finally, the patient was informed that Medicine is not an exact science; therefore, there is also the possibility of unforeseen or unpredictable risks and/or possible complications that may result in a catastrophic outcome. The patient indicated having understood very clearly. We have given the patient no guarantees and we have made no promises. Enough time was given to the patient to ask questions, all of which were answered to the patient's satisfaction. Ms. Vitelli has indicated that she wanted to continue with the procedure. Attestation: I, the ordering provider, attest that I have discussed with the patient the benefits, risks, side-effects, alternatives, likelihood of achieving goals, and potential problems during recovery for the procedure that I have provided informed consent. Date  Time: 06/09/2023  7:52 AM  Pre-Procedure Preparation:  Monitoring: As per clinic protocol. Respiration, ETCO2, SpO2, BP, heart rate and rhythm monitor placed and checked for adequate function Safety Precautions: Patient was assessed for positional comfort and pressure points before starting the procedure. Time-out: I initiated and conducted the "Time-out" before starting the procedure, as per protocol. The patient was asked to participate by confirming the accuracy of the "Time Out" information. Verification of the correct person, site, and procedure were performed and confirmed by me, the nursing staff, and the patient. "Time-out" conducted as per Joint Commission's Universal Protocol (UP.01.01.01). Time: 0821 Start Time: 0821 hrs.  Description/Narrative of Procedure:          Start Time: 0821 hrs.  Rationale (medical necessity): procedure needed and proper for the diagnosis and/or treatment of the patient's medical symptoms and needs. Procedural Technique Safety Precautions: Aspiration looking for blood return was conducted prior to all injections. At no point did we inject any substances, as a needle  was being advanced. No attempts were made at seeking any paresthesias. Safe injection practices and needle disposal techniques used. Medications properly checked for expiration dates. SDV (single dose vial) medications used. Description of the Procedure: Protocol guidelines were followed. The patient was assisted into a comfortable position. The target area was identified and the area prepped in the usual manner. Skin & deeper tissues infiltrated with local anesthetic. Appropriate amount of  time allowed to pass for local anesthetics to take effect. The procedure needles were then advanced to the target area. Proper needle placement secured. Negative aspiration confirmed. Solution injected in intermittent fashion, asking for systemic symptoms every 0.5cc of injectate. The needles were then removed and the area cleansed, making sure to leave some of the prepping solution back to take advantage of its long term bactericidal properties.  Technical description of procedure:  Fluoroscopy using a posterior anterior 45 degree angle from the midline aiming at the anterolateral aspect of the patient was used to find a direct path into the sacroiliac joint, the superior medial to posterior superior iliac spine.  The skin was marked where the desired target and the skin infiltrated with local anesthetics.  The procedure needle was then advanced until the joint was entered.  Once inside of the joint, we then proceeded to inject the desired solution.  Vitals:   06/09/23 0828 06/09/23 0835 06/09/23 0845 06/09/23 0855  BP:  106/69 107/71 110/71  Pulse:      Resp: 15 16 16 16   Temp:  (!) 96 F (35.6 C)    TempSrc:      SpO2: 100% 99% 95% 97%  Weight:      Height:         End Time: 0827 hrs.  Imaging Guidance (Non-Spinal):          Type of Imaging Technique: Fluoroscopy Guidance (Non-Spinal) Indication(s): Assistance in needle guidance and placement for procedures requiring needle placement in or near specific  anatomical locations not easily accessible without such assistance. Exposure Time: Please see nurses notes. Contrast: None used. Fluoroscopic Guidance: I was personally present during the use of fluoroscopy. "Tunnel Vision Technique" used to obtain the best possible view of the target area. Parallax error corrected before commencing the procedure. "Direction-depth-direction" technique used to introduce the needle under continuous pulsed fluoroscopy. Once target was reached, antero-posterior, oblique, and lateral fluoroscopic projection used confirm needle placement in all planes. Images permanently stored in EMR. Interpretation: No contrast injected. I personally interpreted the imaging intraoperatively. Adequate needle placement confirmed in multiple planes. Permanent images saved into the patient's record.  Post-operative Assessment:  Post-procedure Vital Signs:  Pulse/HCG Rate: 6462 Temp: (!) 96 F (35.6 C) Resp: 16 BP: 110/71 SpO2: 97 %  EBL: None  Complications: No immediate post-treatment complications observed by team, or reported by patient.  Note: The patient tolerated the entire procedure well. A repeat set of vitals were taken after the procedure and the patient was kept under observation following institutional policy, for this type of procedure. Post-procedural neurological assessment was performed, showing return to baseline, prior to discharge. The patient was provided with post-procedure discharge instructions, including a section on how to identify potential problems. Should any problems arise concerning this procedure, the patient was given instructions to immediately contact us, at any time, without hesitation. In any case, we plan to contact the patient by telephone for a follow-up status report regarding this interventional procedure.  Comments:  No additional relevant information.  Plan of Care (POC)  Orders:  Orders Placed This Encounter  Procedures   SACROILIAC  JOINT INJECTION    Scheduling Instructions:     Side: Bilateral     Sedation: Patient's choice.     Timeframe: Today    Order Specific Question:   Where will this procedure be performed?    Answer:   [facility]   DG [facility] C-ARM 1-60 MIN NO REPORT  Intraoperative interpretation by procedural physician at [facility].    Standing Status:   Standing    Number of Occurrences:   1    Order Specific Question:   Reason for exam:    Answer:   Assistance in needle guidance and placement for procedures requiring needle placement in or near specific anatomical locations not easily accessible without such assistance.   Informed Consent Details: Physician/Practitioner Attestation; Transcribe to consent form and obtain patient signature    Nursing Order: Transcribe to consent form and obtain patient signature. Note: Always confirm laterality of pain with Ms. Weimann, before procedure.    Order Specific Question:   Physician/Practitioner attestation of informed consent for procedure/surgical case    Answer:   I, the physician/practitioner, attest that I have discussed with the patient the benefits, risks, side effects, alternatives, likelihood of achieving goals and potential problems during recovery for the procedure that I have provided informed consent.    Order Specific Question:   Procedure    Answer:   Sacroiliac Joint Block    Order Specific Question:   Physician/Practitioner performing the procedure    Answer:   Karim Aiello A. Laban Emperor, MD    Order Specific Question:   Indication/Reason    Answer:   Chronic Low Back and Hip Pain secondary to Sacroiliac Joint Pain (Arthralgia/Arthropathy)   Provide equipment / supplies at bedside    Procedure tray: "Block Tray" (Disposable  single use) Skin infiltration needle: Regular 1.5-in, 25-G, (x1) Block Needle type: Spinal Amount/quantity: 2 Size: Medium (5-inch) Gauge: 22G    Standing Status:   Standing    Number of  Occurrences:   1    Order Specific Question:   Specify    Answer:   Block Tray   Chronic Opioid Analgesic:  None MME/day: 0 mg/day   Medications ordered for procedure: Meds ordered this encounter  Medications   lidocaine (XYLOCAINE) 2 % (with pres) injection 400 mg   pentafluoroprop-tetrafluoroeth (GEBAUERS) aerosol   lactated ringers infusion   midazolam (VERSED) 5 MG/5ML injection 0.5-2 mg    Make sure Flumazenil is available in the pyxis when using this medication. If oversedation occurs, administer 0.2 mg IV over 15 sec. If after 45 sec no response, administer 0.2 mg again over 1 min; may repeat at 1 min intervals; not to exceed 4 doses (1 mg)   fentaNYL (SUBLIMAZE) injection 25-50 mcg    Make sure Narcan is available in the pyxis when using this medication. In the event of respiratory depression (RR< 8/min): Titrate NARCAN (naloxone) in increments of 0.1 to 0.2 mg IV at 2-3 minute intervals, until desired degree of reversal.   methylPREDNISolone acetate (DEPO-MEDROL) injection 80 mg   ropivacaine (PF) 2 mg/mL (0.2%) (NAROPIN) injection 4 mL   methylPREDNISolone acetate (DEPO-MEDROL) injection 80 mg   ropivacaine (PF) 2 mg/mL (0.2%) (NAROPIN) injection 4 mL   Medications administered: We administered lidocaine, pentafluoroprop-tetrafluoroeth, lactated ringers, midazolam, fentaNYL, methylPREDNISolone acetate, ropivacaine (PF) 2 mg/mL (0.2%), methylPREDNISolone acetate, and ropivacaine (PF) 2 mg/mL (0.2%).  See the medical record for exact dosing, route, and time of administration.  Follow-up plan:   Return in about 2 weeks (around 06/23/2023) for Proc-day (T,Th), (Face2F), (PPE).       Interventional Therapies  Risk Factors  Considerations:   Allergy  Intolerance: Hydrocodone  Decreased eGFR  Stage III CKD Elevated LFT  GERD Mitral Valve Prolapse  PVCs     WNL   Planned  Pending:   Diagnostic bilateral sacroiliac joint  Blk #1    Under consideration:   Diagnostic  midline caudal ESI + epidurogram #1  Diagnostic bilateral sacroiliac joint Blk #1  Diagnostic bilateral IA hip inj. #1  Diagnostic left cervical ESI #1  Diagnostic bilateral cervical facet MBB #1    Completed:   Therapeutic right (L3-4, L4-5, L5-S1) lumbar facet (L2-S1) MB- RFA x1 (02/26/2023)  Therapeutic left (L3-4, L4-5, L5-S1) lumbar facet (L2-S1) MB- RFA x1 (01/20/2023) (100/100/100/LBP:90Hip:50) Diagnostic bilateral lumbar facet MBB x2 (06/19/2022) (100/100/100 x 3 days/0) (LBP & LEP improved)    Completed by other providers:   Right sacroiliac joint fusion (canceled on 05/28/2016)  Diagnostic/therapeutic bilateral L4-5 IA facet inj. (12/12/2019) by August Luz, MD ([facility])  Diagnostic/therapeutic left L4-5 and L5-S1 TFESI (05/22/2021) by August Luz, MD ([facility])    Therapeutic  Palliative (PRN) options:   None established       Recent Visits Date Type Provider Dept  05/19/23 Office Visit Delano Metz, MD [facility]  04/21/23 Procedure visit Delano Metz, MD [facility]  04/09/23 Office Visit Delano Metz, MD [facility]  Showing recent visits within past 90 days and meeting all other requirements Today's Visits Date Type Provider Dept  06/09/23 Procedure visit Delano Metz, MD [facility]  Showing today's visits and meeting all other requirements Future Appointments Date Type Provider Dept  07/02/23 Appointment Delano Metz, MD [facility]  Showing future appointments within next 90 days and meeting all other requirements  Disposition: Discharge home  Discharge (Date  Time): 06/09/2023; 0900 hrs.   Primary Care Physician: Rolm Gala, MD Location: [facility] Note by: Oswaldo Done, MD (TTS technology used. I apologize for any typographical errors that were not detected and corrected.) Date: 06/09/2023; Time: 9:16  AM  Disclaimer:  Medicine is not an Visual merchandiser. The only guarantee in medicine is that nothing is guaranteed. It is important to note that the decision to proceed with this intervention was based on the information collected from the patient. The Data and conclusions were drawn from the patient's questionnaire, the interview, and the physical examination. Because the information was provided in large part by the patient, it cannot be guaranteed that it has not been purposely or unconsciously manipulated. Every effort has been made to obtain as much relevant data as possible for this evaluation. It is important to note that the conclusions that lead to this procedure are derived in large part from the available data. Always take into account that the treatment will also be dependent on availability of resources and existing treatment guidelines, considered by other Pain Management Practitioners as being common knowledge and practice, at the time of the intervention. For Medico-Legal purposes, it is also important to point out that variation in procedural techniques and pharmacological choices are the acceptable norm. The indications, contraindications, technique, and results of the above procedure should only be interpreted and judged by a Board-Certified Interventional Pain Specialist with extensive familiarity and expertise in the same exact procedure and technique.

## 2023-06-09 ENCOUNTER — Ambulatory Visit
Admission: RE | Admit: 2023-06-09 | Discharge: 2023-06-09 | Disposition: A | Discharge status: Home / Self Care | Payer: BC Managed Care – PPO | Source: Ambulatory Visit | Attending: Pain Medicine | Admitting: Pain Medicine

## 2023-06-09 ENCOUNTER — Encounter: Payer: Self-pay | Admitting: Pain Medicine

## 2023-06-09 ENCOUNTER — Ambulatory Visit: Payer: BC Managed Care – PPO | Attending: Pain Medicine | Admitting: Pain Medicine

## 2023-06-09 VITALS — BP 110/71 | HR 64 | Temp 96.0°F | Resp 16 | Ht 66.0 in | Wt 199.0 lb

## 2023-06-09 DIAGNOSIS — G8929 Other chronic pain: Secondary | ICD-10-CM | POA: Insufficient documentation

## 2023-06-09 DIAGNOSIS — M47898 Other spondylosis, sacral and sacrococcygeal region: Secondary | ICD-10-CM | POA: Insufficient documentation

## 2023-06-09 DIAGNOSIS — Z885 Allergy status to narcotic agent status: Secondary | ICD-10-CM | POA: Insufficient documentation

## 2023-06-09 DIAGNOSIS — M9904 Segmental and somatic dysfunction of sacral region: Secondary | ICD-10-CM | POA: Diagnosis not present

## 2023-06-09 DIAGNOSIS — M545 Low back pain, unspecified: Secondary | ICD-10-CM | POA: Diagnosis not present

## 2023-06-09 DIAGNOSIS — M533 Sacrococcygeal disorders, not elsewhere classified: Secondary | ICD-10-CM | POA: Insufficient documentation

## 2023-06-09 MED ORDER — METHYLPREDNISOLONE ACETATE 80 MG/ML IJ SUSP
80.0000 mg | Freq: Once | INTRAMUSCULAR | Status: AC
  Administered 2023-06-09: 80 mg via INTRA_ARTICULAR

## 2023-06-09 MED ORDER — FENTANYL CITRATE (PF) 100 MCG/2ML IJ SOLN
INTRAMUSCULAR | Status: AC
  Filled 2023-06-09: qty 2

## 2023-06-09 MED ORDER — PENTAFLUOROPROP-TETRAFLUOROETH EX AERO
INHALATION_SPRAY | Freq: Once | CUTANEOUS | Status: AC
  Administered 2023-06-09: 30 via TOPICAL
  Filled 2023-06-09: qty 116

## 2023-06-09 MED ORDER — MIDAZOLAM HCL 5 MG/5ML IJ SOLN
0.5000 mg | Freq: Once | INTRAMUSCULAR | Status: AC
  Administered 2023-06-09: 2 mg via INTRAVENOUS

## 2023-06-09 MED ORDER — ROPIVACAINE HCL 2 MG/ML IJ SOLN
4.0000 mL | Freq: Once | INTRAMUSCULAR | Status: AC
  Administered 2023-06-09: 4 mL via INTRA_ARTICULAR

## 2023-06-09 MED ORDER — LIDOCAINE HCL 2 % IJ SOLN
INTRAMUSCULAR | Status: AC
  Filled 2023-06-09: qty 20

## 2023-06-09 MED ORDER — FENTANYL CITRATE (PF) 100 MCG/2ML IJ SOLN
25.0000 ug | INTRAMUSCULAR | Status: DC | PRN
  Administered 2023-06-09: 50 ug via INTRAVENOUS

## 2023-06-09 MED ORDER — LIDOCAINE HCL 2 % IJ SOLN
20.0000 mL | Freq: Once | INTRAMUSCULAR | Status: AC
  Administered 2023-06-09: 400 mg

## 2023-06-09 MED ORDER — LACTATED RINGERS IV SOLN: Freq: Once | INTRAVENOUS | Status: AC

## 2023-06-09 MED ORDER — MIDAZOLAM HCL 5 MG/5ML IJ SOLN
INTRAMUSCULAR | Status: AC
  Filled 2023-06-09: qty 5

## 2023-06-09 MED ORDER — ROPIVACAINE HCL 2 MG/ML IJ SOLN
INTRAMUSCULAR | Status: AC
  Filled 2023-06-09: qty 20

## 2023-06-09 MED ORDER — METHYLPREDNISOLONE ACETATE 80 MG/ML IJ SUSP
INTRAMUSCULAR | Status: AC
  Filled 2023-06-09: qty 2

## 2023-06-09 NOTE — Patient Instructions (Signed)

## 2023-06-10 ENCOUNTER — Telehealth: Payer: Self-pay

## 2023-06-10 NOTE — Telephone Encounter (Signed)
Post procedure follow up. Patinet states she is doing ok.

## 2023-06-26 ENCOUNTER — Other Ambulatory Visit: Payer: Self-pay | Admitting: Unknown Physician Specialty

## 2023-06-26 DIAGNOSIS — H93A2 Pulsatile tinnitus, left ear: Secondary | ICD-10-CM

## 2023-06-30 ENCOUNTER — Encounter: Payer: Self-pay | Admitting: Pain Medicine

## 2023-06-30 ENCOUNTER — Ambulatory Visit: Payer: BC Managed Care – PPO | Attending: Pain Medicine | Admitting: Pain Medicine

## 2023-06-30 VITALS — BP 117/62 | HR 72 | Temp 98.0°F | Ht 66.0 in | Wt 196.0 lb

## 2023-06-30 DIAGNOSIS — M25551 Pain in right hip: Secondary | ICD-10-CM | POA: Insufficient documentation

## 2023-06-30 DIAGNOSIS — Z09 Encounter for follow-up examination after completed treatment for conditions other than malignant neoplasm: Secondary | ICD-10-CM | POA: Insufficient documentation

## 2023-06-30 DIAGNOSIS — M545 Low back pain, unspecified: Secondary | ICD-10-CM | POA: Insufficient documentation

## 2023-06-30 DIAGNOSIS — M25552 Pain in left hip: Secondary | ICD-10-CM | POA: Insufficient documentation

## 2023-06-30 DIAGNOSIS — M533 Sacrococcygeal disorders, not elsewhere classified: Secondary | ICD-10-CM | POA: Insufficient documentation

## 2023-06-30 DIAGNOSIS — G8929 Other chronic pain: Secondary | ICD-10-CM | POA: Diagnosis present

## 2023-06-30 NOTE — Progress Notes (Unsigned)
PROVIDER NOTE: Information contained herein reflects review and annotations entered in association with encounter. Interpretation of such information and data should be left to medically-trained personnel. Information provided to patient can be located elsewhere in the medical record under "Patient Instructions". Document created using STT-dictation technology, any transcriptional errors that may result from process are unintentional.    Patient: Donna Cannon  Service Category: E/M  Provider: Oswaldo Done, MD  DOB: 09-Jul-1966  DOS: 06/30/2023  Referring Provider: Rolm Gala, MD  MRN: [id number]  Specialty: Interventional Pain Management  PCP: Rolm Gala, MD  Type: Established Patient  Setting: Ambulatory outpatient    Location: Office  Delivery: Face-to-face     HPI  [name], a 57 y.o. year old female, is here today because of her Chronic bilateral low back pain without sciatica [M54.50, G89.29]. [name] primary complain today is Back Pain (lower)  Pertinent problems: 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; Radiculopathy of cervical region; Spinal stenosis of cervical region; Osteopenia of multiple sites; Spondylosis of lumbar region without myelopathy or radiculopathy; Thoracic spine pain; Chronic pain syndrome; Abnormal MRI, cervical spine (11/18/2021); Abnormal MRI, lumbar spine (10/16/2019); Lumbosacral facet arthropathy (Multilevel) (Bilateral); 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; Lumbar foraminal stenosis (L4-5) (Bilateral); DDD (degenerative disc disease), lumbosacral; Cervical facet hypertrophy; DDD (degenerative disc disease), cervical; Chronic feet pain (5th area of Pain) (Bilateral); Chronic wrist pain  (7th area of Pain) (Right); Chronic low back pain (1ry area of Pain) (Bilateral) (L>R) w/o sciatica; 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); Chronic lower extremity pain (2ry area of Pain) (Bilateral) (L>R); Chronic lower extremity radicular pain (S1 dermatome) (Bilateral); Chronic upper extremity pain (3ry area of Pain) (Left); Chronic neck pain (4th area of Pain) (Posterior) (Bilateral) (L>R); Chronic ankle pain (6th area of Pain) (Bilateral); Grade 1 Retrolisthesis of cervical spine (C3/C4 and C5/C6); Decreased range of motion of lumbar spine; Painful cervical range of motion; Impaired range of motion of cervical spine; Lumbar facet syndrome; Chronic hip pain (Bilateral); Chronic sacroiliac joint pain (Bilateral); Osteoarthritis of hips (Bilateral); Sacroiliac joint dysfunction (Bilateral); Disorder of sacroiliac joints (Bilateral); Somatic dysfunction of sacroiliac joints (Bilateral); and Other spondylosis, sacral and sacrococcygeal region on their pertinent problem list. Pain Assessment: Severity of Chronic pain is reported as a 3 /10. Location: Back Lower/radiates down right leg to calf occasionally. Onset: More than a month ago. Quality: Sharp, Sore. Timing: Constant. Modifying factor(s): laying down. Vitals:  height is 5\' 6"  (1.676 m) and weight is 196 lb (88.9 kg). Her temporal temperature is 98 F (36.7 C). Her blood pressure is 117/62 and her pulse is 72. Her oxygen saturation is 100%.  BMI: Estimated body mass index is 31.64 kg/m as calculated from the following:   Height as of this encounter: 5\' 6"  (1.676 m).   Weight as of this encounter: 196 lb (88.9 kg). Last encounter: 05/19/2023. Last procedure: 06/09/2023.  Reason for encounter: post-procedure evaluation and assessment. The patient indicates having attained 100% relief of the pain for the duration of the local anesthetic followed by an ongoing 75% improvement of the low back pain.  Post-procedure evaluation   Procedure:  Sacroiliac Joint Steroid Injection #1    Laterality: Bilateral     Level: PSIS (Posterior Superior Iliac Spine)  Imaging: Fluoroscopic guidance Anesthesia:  Local anesthesia (1-2% Lidocaine) Anxiolysis: IV Versed 2.0 mg Sedation: Moderate Sedation Fentanyl 1 mL (50 mcg) DOS: 06/09/2023  Performed by: Oswaldo Done, MD  Purpose: Diagnostic/Therapeutic Indications: Sacroiliac joint pain in the lower back and hip area severe enough to impact quality of life or function. Rationale (medical necessity): procedure needed and proper for the diagnosis and/or treatment of [name] medical symptoms and needs. 1. Chronic low back pain (1ry area of Pain) (Bilateral) (L>R) w/o sciatica   2. Chronic sacroiliac joint pain (Bilateral)   3. Sacroiliac joint dysfunction (Bilateral)   4. Somatic dysfunction of sacroiliac joints (Bilateral)   5. Other spondylosis, sacral and sacrococcygeal region    NAS-11 Pain score:   Pre-procedure: 5 /10   Post-procedure: 0-No pain/10      Effectiveness:  Initial hour after procedure: 100 %. Subsequent 4-6 hours post-procedure: 100 %. Analgesia past initial 6 hours: 75 %. Ongoing improvement:  Analgesic: The patient indicates having attained 100% relief of the pain for the duration of the local anesthetic followed by an ongoing 75% improvement of the low back pain. Function: [name] reports improvement in function ROM: [name] reports improvement in ROM  Pharmacotherapy Assessment  Analgesic: None MME/day: 0 mg/day   Monitoring: [place] PMP: PDMP reviewed during this encounter.       Pharmacotherapy: No side-effects or adverse reactions reported. Compliance: No problems identified. Effectiveness: Clinically acceptable.  No notes on file  No results found for: "CBDTHCR" No results found for: "D8THCCBX" No results found for: "D9THCCBX"  UDS:  Summary  Date Value Ref Range Status  03/17/2022 Note  Final    Comment:     ==================================================================== Compliance Drug Analysis, Ur ==================================================================== Test                             Result       Flag       Units  Drug Present and Declared for Prescription Verification   Cyclobenzaprine                PRESENT      EXPECTED   Desmethylcyclobenzaprine       PRESENT      EXPECTED    Desmethylcyclobenzaprine is an expected metabolite of    cyclobenzaprine.  Drug Present not Declared for Prescription Verification   Diphenhydramine                PRESENT      UNEXPECTED   Metoprolol                     PRESENT      UNEXPECTED ==================================================================== Test                      Result    Flag   Units      Ref Range   Creatinine              68               mg/dL      >=16 ==================================================================== Declared Medications:  The flagging and interpretation on this report are based on the  following declared medications.  Unexpected results may arise from  inaccuracies in the declared medications.   **Note: The testing scope of this panel includes these medications:   Cyclobenzaprine (Flexeril)   **Note: The testing scope of this panel does not include the  following reported medications:  Allopurinol (Zyloprim)  Calcium  Colchicine  Cyanocobalamin  Levothyroxine (Synthroid)  Losartan (Cozaar)  Pantoprazole (Protonix)  Sennosides (Senokot)  Vitamin D3 ==================================================================== For clinical consultation, please call [phone]. ====================================================================       ROS  Constitutional: Denies any fever or chills Gastrointestinal: No reported hemesis, hematochezia, vomiting, or acute GI distress Musculoskeletal: Denies any acute onset joint swelling, redness, loss of ROM, or  weakness Neurological: No reported episodes of acute onset apraxia, aphasia, dysarthria, agnosia, amnesia, paralysis, loss of coordination, or loss of consciousness  Medication Review  allopurinol, calcium carbonate, cholecalciferol, levothyroxine, losartan, metoprolol succinate, pantoprazole, and senna-docusate  History Review  Allergy: [name] is allergic to hydrocodone. Drug: [name]  reports no history of drug use. Alcohol:  reports current alcohol use. Tobacco:  reports that she has never smoked. She has never used smokeless tobacco. Social: [name]  reports that she has never smoked. She has never used smokeless tobacco. She reports current alcohol use. She reports that she does not use drugs. Medical:  has a past medical history of Anxiety, Arthritis, Cancer (HCC), GERD (gastroesophageal reflux disease), Hypertension, Hypothyroidism, Mitral valve prolapse, PONV (postoperative nausea and vomiting), Sacroiliac dysfunction, Spinal headache, and Wears glasses. Surgical: [name]  has a past surgical history that includes Cesarean section; Tubal ligation; Endometrial ablation; Back surgery; Colonoscopy w/ biopsies and polypectomy; and Esophagogastroduodenoscopy (egd) with propofol (N/A, 12/12/2016). Family: family history includes Breast cancer in her mother; Colon cancer in her brother; Hypertension in her mother and sister.  Laboratory Chemistry Profile   Renal Lab Results  Component Value Date   BUN 14 03/17/2022   CREATININE 1.10 (H) 03/17/2022   GFRAA >60 05/20/2016   GFRNONAA 59 (L) 03/17/2022    Hepatic Lab Results  Component Value Date   AST 69 (H) 03/17/2022   ALT 104 (H) 03/17/2022   ALBUMIN 4.7 03/17/2022   ALKPHOS 88 03/17/2022    Electrolytes Lab Results  Component Value Date   NA 140 03/17/2022   K 4.3 03/17/2022   CL 105 03/17/2022   CALCIUM 10.0 03/17/2022   MG 2.0 03/17/2022    Bone Lab Results  Component Value Date   25OHVITD1 67 03/17/2022    25OHVITD2 61 03/17/2022   25OHVITD3 5.7 03/17/2022    Inflammation (CRP: Acute Phase) (ESR: Chronic Phase) Lab Results  Component Value Date   CRP 0.8 03/17/2022   ESRSEDRATE 7 03/17/2022         Note: Above Lab results reviewed.  Recent Imaging Review  DG PAIN CLINIC C-ARM 1-60 MIN NO REPORT Fluoro was used, but no Radiologist interpretation will be provided.  Please refer to "NOTES" tab for provider progress note. Note: Reviewed        Physical Exam  General appearance: Well nourished, well developed, and well hydrated. In no apparent acute distress Mental status: Alert, oriented x 3 (person, place, & time)       Respiratory: No evidence of acute respiratory distress Eyes: PERLA Vitals: BP 117/62 (BP Location: Right Arm, Patient Position: Sitting, Cuff Size: Large)   Pulse 72   Temp 98 F (36.7 C) (Temporal)   Ht 5\' 6"  (1.676 m)   Wt 196 lb (88.9 kg)   LMP 12/11/2016   SpO2 100%   BMI 31.64 kg/m  BMI: Estimated body mass index is 31.64 kg/m as calculated from the following:   Height as of this encounter: 5\' 6"  (1.676 m).   Weight as of this encounter: 196 lb (88.9 kg). Ideal: Ideal  body weight: 59.3 kg (130 lb 11.7 oz) Adjusted ideal body weight: 71.1 kg (156 lb 13.4 oz)  Assessment   Diagnosis Status  1. Chronic low back pain (1ry area of Pain) (Bilateral) (L>R) w/o sciatica   2. Chronic sacroiliac joint pain (Bilateral)   3. Chronic hip pain (Bilateral)   4. Postop check    Improved Improved Improved   Updated Problems: No problems updated.  Plan of Care  Problem-specific:  No problem-specific Assessment & Plan notes found for this encounter.  [name] has a current medication list which includes the following long-term medication(s): allopurinol, calcium carbonate, levothyroxine, losartan, and pantoprazole.  Pharmacotherapy (Medications Ordered): No orders of the defined types were placed in this encounter.  Orders:  Orders Placed This  Encounter  Procedures   Nursing Instructions:    Please complete this patient's postprocedure evaluation.    Scheduling Instructions:     Please complete this patient's postprocedure evaluation.   Follow-up plan:   Return if symptoms worsen or fail to improve.      Interventional Therapies  Risk Factors  Considerations:   Allergy  Intolerance: Hydrocodone  Decreased eGFR  Stage III CKD Elevated LFT  GERD Mitral Valve Prolapse  PVCs     WNL   Planned  Pending:   Diagnostic bilateral sacroiliac joint Blk #1    Under consideration:   Diagnostic midline caudal ESI + epidurogram #1  Diagnostic bilateral sacroiliac joint Blk #1  Diagnostic bilateral IA hip inj. #1  Diagnostic left cervical ESI #1  Diagnostic bilateral cervical facet MBB #1    Completed:   Therapeutic right (L3-4, L4-5, L5-S1) lumbar facet (L2-S1) MB- RFA x1 (02/26/2023)  Therapeutic left (L3-4, L4-5, L5-S1) lumbar facet (L2-S1) MB- RFA x1 (01/20/2023) (100/100/100/LBP:90Hip:50) Diagnostic bilateral lumbar facet MBB x2 (06/19/2022) (100/100/100 x 3 days/0) (LBP & LEP improved)    Completed by other providers:   Right sacroiliac joint fusion (canceled on 05/28/2016)  Diagnostic/therapeutic bilateral L4-5 IA facet inj. (12/12/2019) by August Luz, MD ([facility])  Diagnostic/therapeutic left L4-5 and L5-S1 TFESI (05/22/2021) by August Luz, MD ([facility])    Therapeutic  Palliative (PRN) options:   None established      Recent Visits Date Type Provider Dept  06/30/23 Office Visit Delano Metz, MD [facility]  06/09/23 Procedure visit Delano Metz, MD [facility]  05/19/23 Office Visit Delano Metz, MD [facility]  04/21/23 Procedure visit Delano Metz, MD [facility]  04/09/23 Office Visit Delano Metz, MD [facility]  Showing recent visits within past 90 days and meeting all other requirements Future  Appointments No visits were found meeting these conditions. Showing future appointments within next 90 days and meeting all other requirements  I discussed the assessment and treatment plan with the patient. The patient was provided an opportunity to ask questions and all were answered. The patient agreed with the plan and demonstrated an understanding of the instructions.  Patient advised to call back or seek an in-person evaluation if the symptoms or condition worsens.  Duration of encounter: 30 minutes.  Total time on encounter, as per AMA guidelines included both the face-to-face and non-face-to-face time personally spent by the physician and/or other qualified health care professional(s) on the day of the encounter (includes time in activities that require the physician or other qualified health care professional and does not include time in activities normally performed by clinical staff). Physician's time may include the following activities when performed:  Preparing to see the patient (e.g., pre-charting review of records, searching for previously ordered imaging, lab work, and nerve conduction tests) Review of prior analgesic pharmacotherapies. Reviewing PMP Interpreting ordered tests (e.g., lab work, imaging, nerve conduction tests) Performing post-procedure evaluations, including interpretation of diagnostic procedures Obtaining and/or reviewing separately obtained history Performing a medically appropriate examination and/or evaluation Counseling and educating the patient/family/caregiver Ordering medications, tests, or procedures Referring and communicating with other health care professionals (when not separately reported) Documenting clinical information in the electronic or other health record Independently interpreting results (not separately reported) and communicating results to the patient/ family/caregiver Care coordination (not separately reported)  Note by: Oswaldo Done, MD Date: 06/30/2023; Time: 9:25 AM

## 2023-06-30 NOTE — Patient Instructions (Signed)
Glucosamine/Chondroitin [place] Turmeric/Curcumin  TENS (Device can be purchased "online", without prescription. Search: "TENS 7000".) Transcutaneous electrical nerve stimulation (TENS) is a method of pain relief involving the use of a mild electrical current. A TENS machine is a small, battery-operated device that has leads connected to sticky pads called electrodes.   Rechargeable 9V batteries:     Electrode placement:   TENS UNIT SAFETY WARNING SHEET and INFORMATION INDICATIONS AND CONTRAINDICTIONS Read the operation manual before using the device. Freight forwarder ([place]) restricts this device to sale by or on the order of a physician. Observe your physician's precise instructions and let him show you where to apply the electrodes. For a successful therapy, the correct application of the electrodes is an important factor. Carefully write down the settings your physician recommended. Indications for use This device is a prescription device and only for symptomatic relief of chronic intractable pain. Contraindications:   Any electrode placement that applies current to the carotid sinus (neck) region.   Patients with implanted electronic devices (for example, a pacemaker) or metallic implants should not undertake.   Any electrode placement that causes current to flow transcerebrally (through the head). The use of unit whenever pain symptoms are undiagnosed, unit etiology is determined.   The use of TENS whenever pain syndromes are undiagnosed, until etiology is established.  WARNINGS AND PRECAUTIONS  Warnings:   The device must be kept out of reach of children.   The safety of device for use during pregnancy or delivery has not been established.   Do not place electrodes on front of the throat. This may result in spasms of the laryngeal and pharyngeal muscles.   Do not place the electrodes over the carotid nerve (side of neck below ear).   The device is not effective for pain of central origin  (headaches).   The device may interfere with electronic monitoring equipment (such as ECG monitors and ECG alarms).   Electrodes should not be placed over the eyes, in the mouth, or internally.   These devices have no curative value.   TENS devices should be used only under the continued supervision of a physician.   TENS is a symptomatic treatment and as such suppresses the sensation of pain which would otherwise serve as a protective mechanism. Precautions/Adverse Reactions   Isolated cases of skin irritation may occur at the site of electrode placement following long-term application.   Stimulation should be stopped and electrodes removed until the cause of the irritation can be determined.   Effectiveness is highly dependent upon patient selection by a person qualified in the management of pain patients.   If the device treatment becomes ineffective or unpleasant, stimulation should be discontinued until reevaluation by a physician/clinician.   Always turn the device off before applying or removing electrodes.   Skin irritation and electrode burns are potential adverse reactions.  PURPOSE: A Transcutaneous Electrical Nerve Stimulator, or TENS, unit is designed to relieve post-operative, acute and chronic pain. It is used for pain caused by peripheral nerves and not central. TENS units are prescription-only devices.  OPERATION: TENS units work in a couple of ways. The first way they are thought to work is by a method called the [employer]. The [employer] states that our brains can only handle one stimulus at a time. When you have chronic pain, this pain signal is constantly being sent to your brain and recognized as pain. When an electrical stimulus is added to the area of pain the body feels  this electrical stimulus, and since the brain can only handle one thing at a time, the pain is not transmitted to the brain. The second method thought to be part of TENS unit's success is by way of stimulating our own  bodies to release their own natural painkillers. TENS units do not work for everyone and results may vary. Always follow the instructions and warnings in your user's manual.  USE: One of the most important tasks that must be performed is battery maintenance. If you are using a Engineering geologist, always fully charge it and fully deplete it before charging it again. These batteries can develop memories and by not performing this charging task correctly, your battery's life can be greatly diminished. If your battery does develop a memory you can help expand the memory by charging for 12 - 13 hours and then completely depleting the battery. Always prepare the skin before applying electrodes. Your skin should be clean and free of any lotions or creams. If you are using electrodes that use conductive gel, apply a small, even layer over the electrode. For carbon, self-adhesive electrodes, apply a drop of water to the electrodes before applying to the skin. The electrodes attach to the lead wires and then the TENS unit. Always grasp the connector and not the cord when inserting or removing. When making adjustments, always make sure the unit's channels (1 and 2) are in the OFF position. The actual settings should be recommended and prescribed by your physician. Medical equipment suppliers don'tset or instruct users as to user settings. When you are using the BURST mode, the unit delivers a series of quick pulses followed by a rest. This cycle repeats itself frequently. Always have channels OFF before changing modes.  For MODULATION mode, the stimulation automatically varies the width of the pulse.  For CONVENTIONAL mode, the stimulation is constant. After the settings have been fine-tuned, set the timer to 30 or 60 minutes. Your physician should also prescribe the use time. When the lights become dim, it means your batteries should be replaced or recharged.  ACCESSORIES: The electrodes and lead wires  can be obtained from your medical equipment supplier. Your medical equipment supplier can set up a recurring delivery to accommodate your needs. Electrodes should be replaced once a month and lead wires once every 6 months.  Video Tutorial [email or web address]

## 2023-07-01 ENCOUNTER — Ambulatory Visit
Admission: RE | Admit: 2023-07-01 | Discharge: 2023-07-01 | Disposition: A | Discharge status: Home / Self Care | Payer: BC Managed Care – PPO | Source: Ambulatory Visit | Attending: Unknown Physician Specialty | Admitting: Unknown Physician Specialty

## 2023-07-01 DIAGNOSIS — H93A2 Pulsatile tinnitus, left ear: Secondary | ICD-10-CM

## 2023-07-02 ENCOUNTER — Ambulatory Visit: Payer: BC Managed Care – PPO | Admitting: Pain Medicine

## 2023-07-07 ENCOUNTER — Ambulatory Visit
Admission: RE | Admit: 2023-07-07 | Discharge: 2023-07-07 | Disposition: A | Discharge status: Home / Self Care | Payer: BC Managed Care – PPO | Source: Ambulatory Visit | Attending: Unknown Physician Specialty | Admitting: Unknown Physician Specialty

## 2023-07-07 DIAGNOSIS — H93A2 Pulsatile tinnitus, left ear: Secondary | ICD-10-CM

## 2023-08-03 NOTE — Progress Notes (Unsigned)
Referring Physician:  Linus Salmons, MD [address] [place],  [place] [address]  Primary Physician:  Rolm Gala, MD  History of Present Illness: 08/04/2023 Donna Cannon is here today with a chief complaint of ringing in her ear.  She was evaluated by Dr. Jenne Campus, who ordered an MR angiogram.  Her symptoms have improved significantly, but there was a tortuous anterior inferior cerebral artery noted.  She was sent today for evaluation.  She is relatively asymptomatic now.  Past Surgery: denies  Lumbar Surgery in  L4-5, 2015 or  by Dr. Shon Baton ([facility] )   Donna Cannon has no symptoms of cervical myelopathy.  The symptoms are causing a significant impact on the patient's life.   I have utilized the care everywhere function in epic to review the outside records available from external health systems.  Review of Systems:  A 10 point review of systems is negative, except for the pertinent positives and negatives detailed in the HPI.  Past Medical History: Past Medical History:  Diagnosis Date   Anxiety    Arthritis    Cancer (HCC)    melanoma   GERD (gastroesophageal reflux disease)    Hypertension    Hypothyroidism    Mitral valve prolapse    " mild"   PONV (postoperative nausea and vomiting)    Sacroiliac dysfunction    left   Spinal headache    Wears glasses     Past Surgical History: Past Surgical History:  Procedure Laterality Date   BACK SURGERY     lumbar decompression   CESAREAN SECTION     COLONOSCOPY W/ BIOPSIES AND POLYPECTOMY     ENDOMETRIAL ABLATION     ESOPHAGOGASTRODUODENOSCOPY (EGD) WITH PROPOFOL N/A 12/12/2016   Procedure: ESOPHAGOGASTRODUODENOSCOPY (EGD) WITH PROPOFOL;  Surgeon: Wyline Mood, MD;  Location: [facility] END[facility];  Service: Endoscopy;  Laterality: N/A;   TUBAL LIGATION      Allergies: Allergies as of 08/04/2023 - Review Complete 08/04/2023  Allergen Reaction Noted   Hydrocodone Nausea Only  05/16/2016    Medications:  Current Outpatient Medications:    allopurinol (ZYLOPRIM) 100 MG tablet, Take 100 mg by mouth daily., Disp: , Rfl:    cholecalciferol (VITAMIN D3) 25 MCG (1000 UNIT) tablet, Take 2,000 Units by mouth daily., Disp: , Rfl:    losartan (COZAAR) 50 MG tablet, Take by mouth., Disp: , Rfl:    metoprolol succinate (TOPROL-XL) 100 MG 24 hr tablet, Take 100 mg by mouth daily. Take with or immediately following a meal., Disp: , Rfl:    pantoprazole (PROTONIX) 40 MG tablet, Take 1 tablet (40 mg total) by mouth daily., Disp: 90 tablet, Rfl: 1   senna-docusate (SENOKOT-S) 8.6-50 MG tablet, Take 1 tablet by mouth daily., Disp: , Rfl:    levothyroxine (SYNTHROID, LEVOTHROID) 100 MCG tablet, Take 100 mcg by mouth., Disp: , Rfl:   Social History: Social History   Tobacco Use   Smoking status: Never   Smokeless tobacco: Never  Vaping Use   Vaping status: Never Used  Substance Use Topics   Alcohol use: Yes    Comment: social wine   Drug use: No    Family Medical History: Family History  Problem Relation Age of Onset   Breast cancer Mother    Hypertension Mother    Hypertension Sister    Colon cancer Brother     Physical Examination: Vitals:   08/04/23 1300  BP: 100/68    General: Patient is in  no apparent distress. Attention to examination is appropriate.  Neck:   Supple.  Full range of motion.  Respiratory: Patient is breathing without any difficulty.   NEUROLOGICAL:     Awake, alert, oriented to person, place, and time.  Speech is clear and fluent.   Cranial Nerves: Pupils equal round and reactive to light.  Facial tone is symmetric.  Facial sensation is symmetric. Shoulder shrug is symmetric. Tongue protrusion is midline.  There is no pronator drift.  Strength: Side Biceps Triceps Deltoid Interossei Grip Wrist Ext. Wrist Flex.  R 5 5 5 5 5 5 5   L 5 5 5 5 5 5 5    Side Iliopsoas Quads Hamstring PF DF EHL  R 5 5 5 5 5 5   L 5 5 5 5 5 5     Reflexes are 1+ and symmetric at the biceps, triceps, brachioradialis, patella and achilles.   Hoffman's is absent.   Bilateral upper and lower extremity sensation is intact to light touch.    No evidence of dysmetria noted.  Gait is normal.     Medical Decision Making  Imaging: MRA Head 07/07/2023 IMPRESSION: Largely normal MRA appearance of the Circle of Willis; The Left AICA is asymmetric and tortuous at the left internal auditory canal. However, such an appearance is most often clinically silent.     Electronically Signed   By: Odessa Fleming M.D.   On: 07/20/2023 12:38  I have personally reviewed the images and agree with the above interpretation.  Assessment and Plan: [name] is a pleasant 57 y.o. female with slightly torturous appearance of the left anterior inferior cerebellar artery.  I do not think this is symptomatic.  She has no additional findings.  I would not recommend any intervention.  I will be happy to see her back on an as-needed basis.  I do not think this is a worrisome finding.    Thank you for involving me in the care of this patient.      Kynzlie Hilleary K. Myer Haff MD, [facility] Neurosurgery

## 2023-08-04 ENCOUNTER — Ambulatory Visit (INDEPENDENT_AMBULATORY_CARE_PROVIDER_SITE_OTHER): Payer: BC Managed Care – PPO | Admitting: Neurosurgery

## 2023-08-04 ENCOUNTER — Encounter: Payer: Self-pay | Admitting: Neurosurgery

## 2023-08-04 VITALS — BP 100/68 | Ht 66.0 in | Wt 198.8 lb

## 2023-08-04 DIAGNOSIS — I771 Stricture of artery: Secondary | ICD-10-CM

## 2023-08-04 DIAGNOSIS — R42 Dizziness and giddiness: Secondary | ICD-10-CM
